# Patient Record
Sex: Male | Born: 1955 | Race: White | Hispanic: No | Marital: Single | Smoking: Never smoker
Health system: Southern US, Community
[De-identification: ages and names within clinical notes are randomized; demographics above are authoritative.]

## PROBLEM LIST (undated history)

## (undated) DIAGNOSIS — F32A Depression, unspecified: Secondary | ICD-10-CM

## (undated) DIAGNOSIS — N183 Chronic kidney disease, stage 3 unspecified: Secondary | ICD-10-CM

## (undated) DIAGNOSIS — M109 Gout, unspecified: Secondary | ICD-10-CM

## (undated) DIAGNOSIS — I10 Essential (primary) hypertension: Secondary | ICD-10-CM

## (undated) DIAGNOSIS — Z972 Presence of dental prosthetic device (complete) (partial): Secondary | ICD-10-CM

## (undated) DIAGNOSIS — E119 Type 2 diabetes mellitus without complications: Secondary | ICD-10-CM

## (undated) DIAGNOSIS — G3184 Mild cognitive impairment, so stated: Secondary | ICD-10-CM

## (undated) HISTORY — PX: TOE AMPUTATION: SHX809

## (undated) HISTORY — PX: COLONOSCOPY: SHX174

---

## 2021-06-02 ENCOUNTER — Observation Stay
Admission: EM | Admit: 2021-06-02 | Discharge: 2021-06-04 | Disposition: A | Discharge status: Skilled Nursing Facility | Payer: Medicare PPO | Attending: Internal Medicine | Admitting: Internal Medicine

## 2021-06-02 ENCOUNTER — Emergency Department: Payer: Medicare PPO

## 2021-06-02 ENCOUNTER — Other Ambulatory Visit: Payer: Self-pay

## 2021-06-02 ENCOUNTER — Observation Stay: Payer: Medicare PPO

## 2021-06-02 DIAGNOSIS — I080 Rheumatic disorders of both mitral and aortic valves: Secondary | ICD-10-CM | POA: Diagnosis not present

## 2021-06-02 DIAGNOSIS — E669 Obesity, unspecified: Secondary | ICD-10-CM | POA: Insufficient documentation

## 2021-06-02 DIAGNOSIS — R55 Syncope and collapse: Principal | ICD-10-CM | POA: Diagnosis present

## 2021-06-02 DIAGNOSIS — E785 Hyperlipidemia, unspecified: Secondary | ICD-10-CM | POA: Diagnosis not present

## 2021-06-02 DIAGNOSIS — Z6830 Body mass index (BMI) 30.0-30.9, adult: Secondary | ICD-10-CM | POA: Diagnosis not present

## 2021-06-02 DIAGNOSIS — R4182 Altered mental status, unspecified: Secondary | ICD-10-CM | POA: Insufficient documentation

## 2021-06-02 DIAGNOSIS — R61 Generalized hyperhidrosis: Secondary | ICD-10-CM | POA: Diagnosis not present

## 2021-06-02 DIAGNOSIS — Z89412 Acquired absence of left great toe: Secondary | ICD-10-CM | POA: Insufficient documentation

## 2021-06-02 DIAGNOSIS — N1831 Chronic kidney disease, stage 3a: Secondary | ICD-10-CM | POA: Diagnosis not present

## 2021-06-02 DIAGNOSIS — Z20822 Contact with and (suspected) exposure to covid-19: Secondary | ICD-10-CM | POA: Diagnosis not present

## 2021-06-02 DIAGNOSIS — I129 Hypertensive chronic kidney disease with stage 1 through stage 4 chronic kidney disease, or unspecified chronic kidney disease: Secondary | ICD-10-CM | POA: Diagnosis not present

## 2021-06-02 DIAGNOSIS — Z79899 Other long term (current) drug therapy: Secondary | ICD-10-CM | POA: Diagnosis not present

## 2021-06-02 DIAGNOSIS — K5909 Other constipation: Secondary | ICD-10-CM | POA: Diagnosis not present

## 2021-06-02 DIAGNOSIS — R600 Localized edema: Secondary | ICD-10-CM | POA: Insufficient documentation

## 2021-06-02 DIAGNOSIS — E1122 Type 2 diabetes mellitus with diabetic chronic kidney disease: Secondary | ICD-10-CM | POA: Insufficient documentation

## 2021-06-02 DIAGNOSIS — Z794 Long term (current) use of insulin: Secondary | ICD-10-CM | POA: Insufficient documentation

## 2021-06-02 HISTORY — DX: Type 2 diabetes mellitus without complications: E11.9

## 2021-06-02 LAB — CBC
HCT: 37.8 % — ABNORMAL LOW (ref 39.0–52.0)
Hemoglobin: 12.3 g/dL — ABNORMAL LOW (ref 13.0–17.0)
MCH: 29.1 pg (ref 26.0–34.0)
MCHC: 32.5 g/dL (ref 30.0–36.0)
MCV: 89.6 fL (ref 80.0–100.0)
Platelets: 250 10*3/uL (ref 150–400)
RBC: 4.22 MIL/uL (ref 4.22–5.81)
RDW: 12.7 % (ref 11.5–15.5)
WBC: 6.8 10*3/uL (ref 4.0–10.5)
nRBC: 0 % (ref 0.0–0.2)

## 2021-06-02 LAB — URINE DRUG SCREEN, QUALITATIVE (ARMC ONLY)
Amphetamines, Ur Screen: NOT DETECTED
Barbiturates, Ur Screen: NOT DETECTED
Benzodiazepine, Ur Scrn: NOT DETECTED
Cannabinoid 50 Ng, Ur ~~LOC~~: NOT DETECTED
Cocaine Metabolite,Ur ~~LOC~~: NOT DETECTED
MDMA (Ecstasy)Ur Screen: NOT DETECTED
Methadone Scn, Ur: NOT DETECTED
Opiate, Ur Screen: NOT DETECTED
Phencyclidine (PCP) Ur S: NOT DETECTED
Tricyclic, Ur Screen: NOT DETECTED

## 2021-06-02 LAB — URINALYSIS, ROUTINE W REFLEX MICROSCOPIC
Bilirubin Urine: NEGATIVE
Glucose, UA: 150 mg/dL — AB
Hgb urine dipstick: NEGATIVE
Ketones, ur: NEGATIVE mg/dL
Leukocytes,Ua: NEGATIVE
Nitrite: NEGATIVE
Protein, ur: NEGATIVE mg/dL
Specific Gravity, Urine: 1.005 (ref 1.005–1.030)
pH: 6 (ref 5.0–8.0)

## 2021-06-02 LAB — HIV ANTIBODY (ROUTINE TESTING W REFLEX): HIV Screen 4th Generation wRfx: NONREACTIVE

## 2021-06-02 LAB — BASIC METABOLIC PANEL
Anion gap: 7 (ref 5–15)
BUN: 19 mg/dL (ref 8–23)
CO2: 24 mmol/L (ref 22–32)
Calcium: 8.8 mg/dL — ABNORMAL LOW (ref 8.9–10.3)
Chloride: 104 mmol/L (ref 98–111)
Creatinine, Ser: 1.72 mg/dL — ABNORMAL HIGH (ref 0.61–1.24)
GFR, Estimated: 43 mL/min — ABNORMAL LOW (ref >60)
Glucose, Bld: 160 mg/dL — ABNORMAL HIGH (ref 70–99)
Potassium: 3.8 mmol/L (ref 3.5–5.1)
Sodium: 135 mmol/L (ref 135–145)

## 2021-06-02 LAB — RESP PANEL BY RT-PCR (FLU A&B, COVID) ARPGX2
Influenza A by PCR: NEGATIVE
Influenza B by PCR: NEGATIVE
SARS Coronavirus 2 by RT PCR: NEGATIVE

## 2021-06-02 LAB — GLUCOSE, CAPILLARY
Glucose-Capillary: 323 mg/dL — ABNORMAL HIGH (ref 70–99)
Glucose-Capillary: 312 mg/dL — ABNORMAL HIGH (ref 70–99)

## 2021-06-02 LAB — TROPONIN I (HIGH SENSITIVITY)
Troponin I (High Sensitivity): 9 ng/L (ref <18)
Troponin I (High Sensitivity): 11 ng/L (ref <18)

## 2021-06-02 MED ORDER — HEPARIN SODIUM (PORCINE) 5000 UNIT/ML IJ SOLN
5000.0000 [IU] | Freq: Three times a day (TID) | INTRAMUSCULAR | Status: DC
  Administered 2021-06-02 – 2021-06-04 (×6): 5000 [IU] via SUBCUTANEOUS
  Filled 2021-06-02 (×6): qty 1

## 2021-06-02 MED ORDER — INSULIN DETEMIR 100 UNIT/ML ~~LOC~~ SOLN
15.0000 [IU] | Freq: Two times a day (BID) | SUBCUTANEOUS | Status: DC
  Administered 2021-06-02 – 2021-06-04 (×4): 15 [IU] via SUBCUTANEOUS
  Filled 2021-06-02 (×6): qty 0.15

## 2021-06-02 MED ORDER — ALLOPURINOL 100 MG PO TABS
100.0000 mg | ORAL_TABLET | Freq: Every day | ORAL | Status: DC
  Administered 2021-06-02 – 2021-06-04 (×3): 100 mg via ORAL
  Filled 2021-06-02 (×4): qty 1

## 2021-06-02 MED ORDER — SODIUM CHLORIDE 0.9 % IV SOLN: INTRAVENOUS | Status: DC

## 2021-06-02 MED ORDER — LOSARTAN POTASSIUM 25 MG PO TABS
25.0000 mg | ORAL_TABLET | Freq: Every day | ORAL | Status: DC
  Administered 2021-06-03 – 2021-06-04 (×2): 25 mg via ORAL
  Filled 2021-06-02 (×2): qty 1

## 2021-06-02 MED ORDER — INSULIN ASPART 100 UNIT/ML FLEXPEN: 3.0000 [IU] | PEN_INJECTOR | Freq: Three times a day (TID) | SUBCUTANEOUS | Status: DC

## 2021-06-02 MED ORDER — POLYETHYLENE GLYCOL 3350 17 G PO PACK
17.0000 g | PACK | Freq: Two times a day (BID) | ORAL | Status: DC
  Administered 2021-06-03: 17 g via ORAL
  Filled 2021-06-02 (×4): qty 1

## 2021-06-02 MED ORDER — SODIUM CHLORIDE 0.9 % IV BOLUS
500.0000 mL | Freq: Once | INTRAVENOUS | Status: AC
  Administered 2021-06-02: 500 mL via INTRAVENOUS

## 2021-06-02 MED ORDER — DOCUSATE SODIUM 100 MG PO CAPS
100.0000 mg | ORAL_CAPSULE | Freq: Two times a day (BID) | ORAL | Status: DC
  Administered 2021-06-02 – 2021-06-03 (×3): 100 mg via ORAL
  Filled 2021-06-02 (×4): qty 1

## 2021-06-02 MED ORDER — VITAMIN D 25 MCG (1000 UNIT) PO TABS
2000.0000 [IU] | ORAL_TABLET | Freq: Every day | ORAL | Status: DC
  Administered 2021-06-02 – 2021-06-04 (×3): 2000 [IU] via ORAL
  Filled 2021-06-02 (×3): qty 2

## 2021-06-02 MED ORDER — INSULIN ASPART 100 UNIT/ML IJ SOLN
3.0000 [IU] | Freq: Three times a day (TID) | INTRAMUSCULAR | Status: DC
  Administered 2021-06-02 – 2021-06-04 (×6): 3 [IU] via SUBCUTANEOUS
  Filled 2021-06-02 (×6): qty 1

## 2021-06-02 MED ORDER — SODIUM CHLORIDE 0.9% FLUSH
3.0000 mL | Freq: Two times a day (BID) | INTRAVENOUS | Status: DC
  Administered 2021-06-02: 3 mL via INTRAVENOUS

## 2021-06-02 MED ORDER — INSULIN NPH (HUMAN) (ISOPHANE) 100 UNIT/ML ~~LOC~~ SUSP
15.0000 [IU] | Freq: Two times a day (BID) | SUBCUTANEOUS | Status: DC
Start: 2021-06-02 — End: 2021-06-02

## 2021-06-02 MED ORDER — ONDANSETRON HCL 4 MG/2ML IJ SOLN: 4.0000 mg | Freq: Four times a day (QID) | INTRAMUSCULAR | Status: DC | PRN

## 2021-06-02 MED ORDER — AMLODIPINE BESYLATE 5 MG PO TABS
5.0000 mg | ORAL_TABLET | Freq: Every day | ORAL | Status: DC
Start: 2021-06-03 — End: 2021-06-04
  Administered 2021-06-03 – 2021-06-04 (×2): 5 mg via ORAL
  Filled 2021-06-02 (×2): qty 1

## 2021-06-02 MED ORDER — ONDANSETRON HCL 4 MG PO TABS: 4.0000 mg | ORAL_TABLET | Freq: Four times a day (QID) | ORAL | Status: DC | PRN

## 2021-06-02 NOTE — ED Notes (Signed)
Secure msg sent to Amandeep Kittaney, RN for ED to IP SBAR. °

## 2021-06-02 NOTE — Progress Notes (Addendum)
IVT consulted for PIV placement.  During ID of patient, name is spelled incorrectly on  armband.  RNs Ramell and Amandeep made aware via secure chat. ?

## 2021-06-02 NOTE — ED Notes (Signed)
Diannia Ruder, RN reported to this RN pt was orthostatic from sitting to standing.  ?

## 2021-06-02 NOTE — ED Notes (Signed)
Patient provided with urinal.   Unable to provide urine sample at this time

## 2021-06-02 NOTE — ED Notes (Signed)
This RN answered call bell, pt states he needed to urinate and he insisted that he has not given any urine to anyone today for this visit. This RN called lab and explained to them that this RN was concerned it was not his urine that was sent, this RN requests that they stop running this urine and that I will send a new one, MD aware.  ?

## 2021-06-02 NOTE — ED Triage Notes (Signed)
Patient to ER via [facility] from [place] [place]. Reports he was eating breakfast when he felt lightheaded. Family reports they witnessed the patient become unconscious for approximately one minute where his head fell forwards then back. No seizure activity witnessed. ? ?Initial blood pressure was 74/56. Patient pale, cool, and diaphoretic on EMS arrival. After 500cc bolus of normal saline repeat BP 139/76. Patient also had one episode of vomiting. 4mg  IV zofran administered by EMS.  ? ?EMS VS- HR 74, Ekg NSR. Temp 97.9. CBG 154. ? ?Patient denies pain. No SHOB. No cardiac history.  ?

## 2021-06-02 NOTE — ED Notes (Signed)
Report to Michelle, RN

## 2021-06-02 NOTE — ED Notes (Signed)
Dinner tray brought to pt by dietary services. ?

## 2021-06-02 NOTE — H&P (Signed)
H&P:    Robert Levine   [id number] DOB: October 27, 1955 DOA: 06/02/2021  PCP: Pcp, No  Chief Complaint: Syncope   History of Present Illness:    HPI: Robert Levine is a 66 y.o. male with a past medical history of insulin-dependent diabetes mellitus type 2, chronic kidney disease stage IIIA/B, essential hypertension, history of left great toe amputation, obesity  This patient presents to the ER via EMS from [place] [place] where he was having breakfast when he felt sudden onset of lightheadedness. Family reports they witnessed him lose consciousness for approximately 2 minutes.  His head rolled back.  No seizure activity witnessed.  He was also pale and diaphoretic.  No known history of seizures.  No known history of TIA or CVA. When EMS arrived his initial blood pressure was 74/46.  He received 500 cc of normal saline en-route and repeat blood pressure was 139/76.  He also had 1 episode of vomiting.   Seen and examined in the ER.  No focal neurological deficits.  Appears to be back at his baseline. He resides at [facility] ([facility]).  States he has had hospital admissions before for dehydration.  No urinary continence.  No fecal incontinence.  Did not bite his tongue.  No chest pain. No shortness of breath. No abdominal pain. No diarrhea. No nausea.  ED Course: Given IV fluid hydration with normal saline 500 cc bolus.  EKG unremarkable.  Labs unremarkable with creatinine appearing to be at baseline.  Chest x-ray and CT head negative.  UDS and urinalysis pending.  Initial troponin unremarkable.   ROS:   13 point review of systems is negative except for what is mentioned above in the HPI.   Past Medical History:   Past Medical History:  Diagnosis Date   Diabetes mellitus without complication ([place])     Past Surgical History:   Past Surgical History:  Procedure Laterality Date   TOE AMPUTATION Right     Social History:   Social History   Socioeconomic History   Marital  status: Single    Spouse name: Not on file   Number of children: Not on file   Years of education: Not on file   Highest education level: Not on file  Occupational History   Not on file  Tobacco Use   Smoking status: Not on file   Smokeless tobacco: Not on file  Substance and Sexual Activity   Alcohol use: Not on file   Drug use: Not on file   Sexual activity: Not on file  Other Topics Concern   Not on file  Social History Narrative   Not on file   Social Determinants of Health   Financial Resource Strain: Not on file  Food Insecurity: Not on file  Transportation Needs: Not on file  Physical Activity: Not on file  Stress: Not on file  Social Connections: Not on file  Intimate Partner Violence: Not on file    Allergies:   Not on File  Family History:   No family history on file.   Current Medications:   Prior to Admission medications   Not on File     Physical Exam:   Vitals:   06/02/21 0944 06/02/21 1000 06/02/21 1030 06/02/21 1100  BP:  132/73 (!) 150/74 (!) 144/71  Pulse:  79 83 86  Resp:  18 17 16   Temp: 98.2 F (36.8 C)     TempSrc: Oral     SpO2:  95% 97% 98%  Weight:  Height:         General:  Appears calm and comfortable and is in NAD Cardiovascular:  RRR, no m/r/g.  Respiratory:   CTA bilaterally with no wheezes/rales/rhonchi.  Normal respiratory effort. Abdomen:  soft, NT, ND, NABS Skin:  no rash or induration seen on limited exam Musculoskeletal:  grossly normal tone BUE/BLE, good ROM, no bony abnormality Lower extremity: Trace edema bilateral lower extremities.  Limited foot exam with no ulcerations.  2+ distal pulses.  Left great toe amputation Psychiatric:  grossly normal mood and affect, speech fluent and appropriate, AOx3 Neurologic:  CN 2-12 grossly intact, moves all extremities in coordinated fashion, sensation intact    Data Review:    Radiological Exams on Admission: Independently reviewed - see discussion in A/P  where applicable  CT HEAD WO CONTRAST (5MM)  Result Date: 06/02/2021 CLINICAL DATA:  66 year old male with altered mental status, loss of consciousness while eating breakfast. EXAM: CT HEAD WITHOUT CONTRAST TECHNIQUE: Contiguous axial images were obtained from the base of the skull through the vertex without intravenous contrast. RADIATION DOSE REDUCTION: This exam was performed according to the departmental dose-optimization program which includes automated exposure control, adjustment of the mA and/or kV according to patient size and/or use of iterative reconstruction technique. COMPARISON:  None. FINDINGS: Brain: Cerebral volume is within normal limits for age. No midline shift, ventriculomegaly, mass effect, evidence of mass lesion, intracranial hemorrhage or evidence of cortically based acute infarction. Possible tiny chronic lacunar infarct in the right cerebellum (coronal image 59). But otherwise gray-white matter differentiation throughout the brain appears normal for age. No cerebral cortical encephalomalacia identified. Vascular: Calcified atherosclerosis at the skull base. No suspicious intracranial vascular hyperdensity. Skull: No acute osseous abnormality identified. Hyperostosis frontalis, normal variant. Sinuses/Orbits: Visualized paranasal sinuses and mastoids are clear. Tympanic cavities are clear. Other: Visualized orbits and scalp soft tissues are within normal limits. IMPRESSION: No acute intracranial abnormality. Negative brain for age aside from possible tiny chronic lacunar infarct in the right cerebellum. Electronically Signed   By: Genevie Ann M.D.   On: 06/02/2021 10:58   DG Chest Portable 1 View  Result Date: 06/02/2021 CLINICAL DATA:  66 year old male with altered mental status, loss of consciousness while eating breakfast. EXAM: PORTABLE CHEST 1 VIEW COMPARISON:  None. FINDINGS: Portable AP upright view at 1033 hours. Low normal lung volumes. Normal cardiac size and mediastinal  contours. Visualized tracheal air column is within normal limits. Allowing for portable technique the lungs are clear. No pneumothorax or pleural effusion. No acute osseous abnormality identified. IMPRESSION: Negative portable chest. Electronically Signed   By: Genevie Ann M.D.   On: 06/02/2021 10:59    EKG: Independently reviewed.  Sinus rhythm, rate 85   Labs on Admission: I have personally reviewed the available labs and imaging studies at the time of the admission.  Pertinent labs on Admission: BUN 43, creatinine 1.72, blood glucose 160, hemoglobin 12   Assessment/Plan:   Syncope: Witnessed.  Likely secondary to dehydration and beta-blocker induced orthostatic hypotension.  Initial blood pressure 74/56 that responded to IV fluid resuscitation.  Hemodynamics are now stable.  Start maintenance IV fluids.  Obtain orthostatics every shift. Orthostatics positive in the ED. Obtain echocardiogram.  Obtain carotid duplex ultrasound.  Monitor on telemetry for any arrhythmias.  Hypertension: Continue home Norvasc and Cozaar. Discontinuing home Toprol-XL due to the above (he was on 100mg  in the morning and 150mg  in the evening). No known history of CHF or arrhythmias. Can titrate up on Norvasc  and Cozaar to reach BP goals.  IDDMT2: The patient is on NPH 30 units BID.  Obtain A1c.  We will start NPH 15 units BID. Continue home Novolog 3 units TID with meals. CBG monitoring AC and HS.  Chronic kidney disease stage IIIA/IIIB:  Initial creatinine 1.72.  Appears at baseline per Care Everywhere.  Baseline creatinine appears to be around 1.4-1.7.  Obesity: BMI 30.43 per EMR.  Counseled on lifestyle modifications.  Chronic constipation: Continue home Miralax and Colace   Other information:    Level of Care: Med telemetry DVT prophylaxis: Heparin subcu Code Status: Full code Consults: None Admission status: Observation   Leslee Home DO [facility]   How to contact the [facility] Attending or  Consulting provider [place] or covering provider during after hours [place], for this patient?  Check the care team in [facility] and look for a) attending/consulting [facility] provider listed and b) the [facility] team listed Log into [email or web address] and use [place]'s universal password to access. If you do not have the password, please contact the hospital operator. Locate the [facility] provider you are looking for under [facility] and page to a number that you can be directly reached. If you still have difficulty reaching the provider, please page the [facility] (Director on Call) for the Hospitalists listed on amion for assistance.   06/02/2021, 12:27 PM

## 2021-06-02 NOTE — ED Notes (Signed)
Patient transported to CT 

## 2021-06-02 NOTE — ED Provider Notes (Addendum)
? ?[facility] ?Provider Note ? ? ? Event Date/Time  ? First MD Initiated Contact with Patient 06/02/21 1016   ?  (approximate) ? ? ?History  ? ?Loss of Consciousness ? ? ?HPI ? ?Robert Levine is a 66 y.o. male   history of diabetes type 2 who presents to the ER for evaluation of LOC that occurred while he was at [employer] this morning.  States he is otherwise in his usual state of health.  States that he had finished his meal and friend from church who is currently in the ER witnessed become pale diaphoretic and his head rolled back.  There is no shaking episode..  Did not bite his tongue.  Did have spontaneous respirations but was unresponsive for a few moments.  EMS found the patient was hypotensive.  Patient states he has been admitted for dehydration in the past.  He denies any chest pain or pressure at this time no history of liver issues no history of lung issues.  No history of stroke or TIA.  States he does not take any blood thinners. ? ?  ? ? ?Physical Exam  ? ?Triage Vital Signs: ?ED Triage Vitals  ?Enc Vitals Group  ?   BP 06/02/21 0941 139/76  ?   Pulse Rate 06/02/21 0941 78  ?   Resp 06/02/21 0941 16  ?   Temp 06/02/21 0944 98.2 ?F (36.8 ?C)  ?   Temp Source 06/02/21 0944 Oral  ?   SpO2 06/02/21 0941 95 %  ?   Weight 06/02/21 0942 250 lb (113.4 kg)  ?   Height 06/02/21 0942 6\' 4"  (1.93 m)  ?   Head Circumference --   ?   Peak Flow --   ?   Pain Score 06/02/21 0942 0  ?   Pain Loc --   ?   Pain Edu? --   ?   Excl. in [place]? --   ? ? ?Most recent vital signs: ?Vitals:  ? 06/02/21 1030 06/02/21 1100  ?BP: (!) 150/74 (!) 144/71  ?Pulse: 83 86  ?Resp: 17 16  ?Temp:    ?SpO2: 97% 98%  ? ? ? ?Constitutional: Alert  ?Eyes: Conjunctivae are normal.  ?Head: Atraumatic. ?Nose: No congestion/rhinnorhea. ?Mouth/Throat: Mucous membranes are moist.   ?Neck: Painless ROM.  ?Cardiovascular:   Good peripheral circulation. ?Respiratory: Normal respiratory effort.  No retractions.   ?Gastrointestinal: Soft and nontender.  ?Musculoskeletal:  no deformity ?Neurologic:  MAE spontaneously. No gross focal neurologic deficits are appreciated.  ?Skin:  Skin is warm, dry and intact. No rash noted. ?Psychiatric: Mood and affect are normal. Speech and behavior are normal. ? ? ? ?ED Results / Procedures / Treatments  ? ?Labs ?(all labs ordered are listed, but only abnormal results are displayed) ?Labs Reviewed  ?BASIC METABOLIC PANEL - Abnormal; Notable for the following components:  ?    Result Value  ? Glucose, Bld 160 (*)   ? Creatinine, Ser 1.72 (*)   ? Calcium 8.8 (*)   ? GFR, Estimated 43 (*)   ? All other components within normal limits  ?CBC - Abnormal; Notable for the following components:  ? Hemoglobin 12.3 (*)   ? HCT 37.8 (*)   ? All other components within normal limits  ?URINALYSIS, ROUTINE W REFLEX MICROSCOPIC - Abnormal; Notable for the following components:  ? Color, Urine STRAW (*)   ? APPearance CLEAR (*)   ? Specific Gravity, Urine 1.004 (*)   ?  Hgb urine dipstick MODERATE (*)   ? Ketones, ur 5 (*)   ? Leukocytes,Ua SMALL (*)   ? All other components within normal limits  ?URINE DRUG SCREEN, QUALITATIVE ([facility] ONLY)  ?CBG MONITORING, ED  ?TROPONIN I (HIGH SENSITIVITY)  ? ? ? ?EKG ? ?ED ECG REPORT ?I, Merlyn Lot, the attending physician, personally viewed and interpreted this ECG. ? ? Date: 06/02/2021 ? EKG Time: 9:38 ? Rate: 75 ? Rhythm: sinus ? Axis: normal ? Intervals:normal ? ST&T Change: no stemi, no depression ? ? ? ?RADIOLOGY ?Please see ED Course for my review and interpretation. ? ?I personally reviewed all radiographic images ordered to evaluate for the above acute complaints and reviewed radiology reports and findings.  These findings were personally discussed with the patient.  Please see medical record for radiology report. ? ? ? ?PROCEDURES: ? ?Critical Care performed: No ? ?.1-3 Lead EKG Interpretation ?Performed by: Merlyn Lot, MD ?Authorized by: Merlyn Lot, MD  ? ?  Interpretation: normal   ?  ECG rate:  85 ?  ECG rate assessment: normal   ?  Rhythm: sinus rhythm   ? ? ?MEDICATIONS ORDERED IN ED: ?Medications  ?sodium chloride 0.9 % bolus 500 mL (500 mLs Intravenous New Bag/Given 06/02/21 1041)  ? ? ? ?IMPRESSION / MDM / ASSESSMENT AND PLAN / ED COURSE  ?I reviewed the triage vital signs and the nursing notes. ?             ?               ? ?Differential diagnosis includes, but is not limited to, dysrhythmia, anemia, acs, pna, sepsis, orthostasis, hypoglycemia, ? ?Witnessed syncopal episode as described above.  Patient arrives nontoxic-appearing protecting his airway.  Reports that he has been admitted for dehydration in the past.  May be mildly dehydrated.  Denying any chest pain.  CT imaging ordered to evaluate for acute intracranial abnormality though history seems less consistent with seizure.  The patient will be placed on continuous pulse oximetry and telemetry for monitoring.  Laboratory evaluation will be sent to evaluate for the above complaints.   ? ? ?Clinical Course as of 06/02/21 1201  ?Fri Jun 02, 2021  ?1057 My review of chest x-ray no sign of infiltrate or pneumothorax. [PR]  ?1057 My review of CT head no sign of acute bleed.  Will await formal radiology report. [PR]  ?1200 Troponin is negative.  Based on age risk factors and syncopal episode I have consulted hospitalist for admission who agrees to admit to their service. we will continue with IV hydration. [PR]  ?  ?Clinical Course User Index ?[PR] Merlyn Lot, MD  ? ? ? ?FINAL CLINICAL IMPRESSION(S) / ED DIAGNOSES  ? ?Final diagnoses:  ?Syncope, unspecified syncope type  ? ? ? ?Rx / DC Orders  ? ?ED Discharge Orders   ? ? None  ? ?  ? ? ? ?Note:  This document was prepared using Dragon voice recognition software and may include unintentional dictation errors. ? ?  ? ?  ?Merlyn Lot, MD ?06/02/21 1202 ? ?

## 2021-06-03 ENCOUNTER — Observation Stay (HOSPITAL_BASED_OUTPATIENT_CLINIC_OR_DEPARTMENT_OTHER)
Admit: 2021-06-03 | Discharge: 2021-06-03 | Disposition: A | Discharge status: Home / Self Care | Payer: Medicare PPO | Attending: Family Medicine | Admitting: Family Medicine

## 2021-06-03 DIAGNOSIS — R55 Syncope and collapse: Secondary | ICD-10-CM | POA: Diagnosis not present

## 2021-06-03 LAB — CBC
HCT: 39 % (ref 39.0–52.0)
Hemoglobin: 12.6 g/dL — ABNORMAL LOW (ref 13.0–17.0)
MCH: 29.1 pg (ref 26.0–34.0)
MCHC: 32.3 g/dL (ref 30.0–36.0)
MCV: 90.1 fL (ref 80.0–100.0)
Platelets: 251 10*3/uL (ref 150–400)
RBC: 4.33 MIL/uL (ref 4.22–5.81)
RDW: 12.7 % (ref 11.5–15.5)
WBC: 5.7 10*3/uL (ref 4.0–10.5)
nRBC: 0 % (ref 0.0–0.2)

## 2021-06-03 LAB — BASIC METABOLIC PANEL
Anion gap: 10 (ref 5–15)
BUN: 20 mg/dL (ref 8–23)
CO2: 23 mmol/L (ref 22–32)
Calcium: 9.2 mg/dL (ref 8.9–10.3)
Chloride: 104 mmol/L (ref 98–111)
Creatinine, Ser: 1.49 mg/dL — ABNORMAL HIGH (ref 0.61–1.24)
GFR, Estimated: 51 mL/min — ABNORMAL LOW (ref >60)
Glucose, Bld: 253 mg/dL — ABNORMAL HIGH (ref 70–99)
Potassium: 3.9 mmol/L (ref 3.5–5.1)
Sodium: 137 mmol/L (ref 135–145)

## 2021-06-03 LAB — ECHOCARDIOGRAM COMPLETE
AR max vel: 1.76 cm2
AV Peak grad: 10.5 mmHg
Ao pk vel: 1.62 m/s
Area-P 1/2: 5.38 cm2
Height: 76 in
S' Lateral: 2.97 cm
Weight: 4000 oz

## 2021-06-03 LAB — TSH: TSH: 1.633 u[IU]/mL (ref 0.350–4.500)

## 2021-06-03 LAB — GLUCOSE, CAPILLARY
Glucose-Capillary: 243 mg/dL — ABNORMAL HIGH (ref 70–99)
Glucose-Capillary: 259 mg/dL — ABNORMAL HIGH (ref 70–99)
Glucose-Capillary: 247 mg/dL — ABNORMAL HIGH (ref 70–99)
Glucose-Capillary: 313 mg/dL — ABNORMAL HIGH (ref 70–99)

## 2021-06-03 NOTE — Discharge Summary (Signed)
Physician Discharge Summary   Patient: Robert Levine MRN: [id number] DOB: 1955/10/14  Admit date:     06/02/2021  Discharge date: 06/03/21  Discharge Physician: Antonieta Pert   PCP: Pcp, No   Recommendations at discharge:   Follow-up with PCP in 1 week Monitor blood pressure at home  Hospital Course: [phone] with PMHx of DM, obesity with BMI 30.4, CKD stage IIIa, hypertension history of left great toe amputation, being in ALF, brought from [employer] [facility] by EMS while he had sudden onset of lightheadedness and family witnessed he lost consciousness for approximately 2 minutes-described as  his head rolled back.  No seizure activity witnessed.  He was also pale and diaphoretic.  No known history of seizures.  No known history of TIA or CVA. When EMS arrived his initial blood pressure was 74/46.  He received 500 cc of normal saline en-route and repeat blood pressure was 139/76.  He also had 1 episode of vomiting.  In the ED,nonfocal on exam appeared to be at baseline, EKG unremarkable labs fairly stable chest x-ray and CT head were negative , serial troponins negative x2, COVID-19 and influenza negative UDS negative UA no evidence of UTI.  Patient was admitted for observation overnight, on telemetry carotid ultrasound unremarkable. Awaiting echocardiogram read and it came back no acute significant findings. He is anxious to leave today, He feels improved. Carotid ultrasound unremarkable.  Echo pending orthostatic vitals stable and asymptomatic He will be discharged home once echo is resulted and unremarkable  Discharge diagnosis Episode of syncope Orthostatic syncope: Patient was hypotensive and orthostatic leading to syncope most likely the etiology.  Blood pressure improved IV fluids.  Antihypertensive meds adjusted.  Ruling out other etiology with echocardiogram and a carotid duplex in telemetry. Hypertension-on home Norvasc Cozaar and Toprol-Toprol discontinued. Insulin-dependent  diabetes mellitus type 2 on NPH 30 units twice daily-resume home meds CKD stage IIIa baseline creatinine 1.4-1.7, repeat BMP improved to 1. 4 from 1.7 Class I obesity with BMI 30.4-benefit with weight loss Chronic constipation-takes MiraLAX and Colace. Medically stable for d/c once echo resulted.  No notes have been filed under this hospital service. Service: Hospitalist     Consultants: none Procedures performed: echo, carotid duplex  Disposition: Home Diet recommendation:  Diet Orders (From admission, onward)     Start     Ordered   06/02/21 1219  Diet Carb Modified Fluid consistency: Thin; Room service appropriate? Yes  Diet effective now       Question Answer Comment  Diet-HS Snack? Nothing   Calorie Level Medium 1600-2000   Fluid consistency: Thin   Room service appropriate? Yes      06/02/21 1220             DISCHARGE MEDICATION: Allergies as of 06/03/2021       Reactions   Empagliflozin    Euglycemic DKA 2/2 SGLT2 therapy         Medication List     STOP taking these medications    metoprolol succinate 50 MG 24 hr tablet Commonly known as: TOPROL-XL       TAKE these medications    allopurinol 100 MG tablet Commonly known as: ZYLOPRIM Take 100 mg by mouth daily.   amLODipine 5 MG tablet Commonly known as: NORVASC Take 5 mg by mouth daily.   docusate sodium 100 MG capsule Commonly known as: COLACE Take 100 mg by mouth 2 (two) times daily.   insulin NPH Human 100 UNIT/ML injection Commonly known as: NOVOLIN N  Inject 30 Units into the skin 2 (two) times daily.   losartan 25 MG tablet Commonly known as: COZAAR Take 25 mg by mouth daily.   NovoLOG FlexPen 100 UNIT/ML FlexPen Generic drug: insulin aspart Inject 3 Units into the skin 3 (three) times daily.   polyethylene glycol 17 g packet Commonly known as: MIRALAX / GLYCOLAX Take 17 g by mouth 2 (two) times daily.   Vitamin D3 50 MCG (2000 UT) Tabs Take 2,000 Units by mouth daily.         Follow-up Information     Follow-up with primary care doctor Follow up in 1 week(s).                  Discharge Exam: Filed Weights   06/02/21 0942  Weight: 113.4 kg    Condition at discharge: good  The results of significant diagnostics from this hospitalization (including imaging, microbiology, ancillary and laboratory) are listed below for reference.   Imaging Studies: CT HEAD WO CONTRAST (5MM)  Result Date: 06/02/2021 CLINICAL DATA:  66 year old male with altered mental status, loss of consciousness while eating breakfast. EXAM: CT HEAD WITHOUT CONTRAST TECHNIQUE: Contiguous axial images were obtained from the base of the skull through the vertex without intravenous contrast. RADIATION DOSE REDUCTION: This exam was performed according to the departmental dose-optimization program which includes automated exposure control, adjustment of the mA and/or kV according to patient size and/or use of iterative reconstruction technique. COMPARISON:  None. FINDINGS: Brain: Cerebral volume is within normal limits for age. No midline shift, ventriculomegaly, mass effect, evidence of mass lesion, intracranial hemorrhage or evidence of cortically based acute infarction. Possible tiny chronic lacunar infarct in the right cerebellum (coronal image 59). But otherwise gray-white matter differentiation throughout the brain appears normal for age. No cerebral cortical encephalomalacia identified. Vascular: Calcified atherosclerosis at the skull base. No suspicious intracranial vascular hyperdensity. Skull: No acute osseous abnormality identified. Hyperostosis frontalis, normal variant. Sinuses/Orbits: Visualized paranasal sinuses and mastoids are clear. Tympanic cavities are clear. Other: Visualized orbits and scalp soft tissues are within normal limits. IMPRESSION: No acute intracranial abnormality. Negative brain for age aside from possible tiny chronic lacunar infarct in the right cerebellum.  Electronically Signed   By: Genevie Ann M.D.   On: 06/02/2021 10:58   US Carotid Bilateral  Result Date: 06/02/2021 CLINICAL DATA:  Syncope Hypertension Hyperlipidemia Diabetes EXAM: BILATERAL CAROTID DUPLEX ULTRASOUND TECHNIQUE: Pearline Cables scale imaging, color Doppler and duplex ultrasound were performed of bilateral carotid and vertebral arteries in the neck. COMPARISON:  None. FINDINGS: Criteria: Quantification of carotid stenosis is based on velocity parameters that correlate the residual internal carotid diameter with NASCET-based stenosis levels, using the diameter of the distal internal carotid lumen as the denominator for stenosis measurement. The following velocity measurements were obtained: RIGHT ICA: 102/31 cm/sec CCA: 0000000 cm/sec SYSTOLIC ICA/CCA RATIO:  1.8 ECA: 182 cm/sec LEFT ICA: 91/29 cm/sec CCA: 123XX123 cm/sec SYSTOLIC ICA/CCA RATIO:  1.2 ECA: 159 cm/sec RIGHT CAROTID ARTERY: Mild calcified plaque of the carotid bifurcation. RIGHT VERTEBRAL ARTERY:  Antegrade flow. LEFT CAROTID ARTERY: Mild heterogeneous plaque of the left carotid bifurcation LEFT VERTEBRAL ARTERY:  Antegrade flow. IMPRESSION: No significant stenosis of internal carotid arteries. Electronically Signed   By: Miachel Roux M.D.   On: 06/02/2021 14:21   DG Chest Portable 1 View  Result Date: 06/02/2021 CLINICAL DATA:  66 year old male with altered mental status, loss of consciousness while eating breakfast. EXAM: PORTABLE CHEST 1 VIEW COMPARISON:  None. FINDINGS: Portable AP upright  view at 1033 hours. Low normal lung volumes. Normal cardiac size and mediastinal contours. Visualized tracheal air column is within normal limits. Allowing for portable technique the lungs are clear. No pneumothorax or pleural effusion. No acute osseous abnormality identified. IMPRESSION: Negative portable chest. Electronically Signed   By: Genevie Ann M.D.   On: 06/02/2021 10:59   ECHOCARDIOGRAM COMPLETE  Result Date: 06/03/2021    ECHOCARDIOGRAM REPORT    Patient Name:   Robert Levine Date of Exam: 06/03/2021 Medical Rec #:  [id number]      Height:       76.0 in Accession #:    [id number]     Weight:       250.0 lb Date of Birth:  1955/11/26     BSA:          2.436 m Patient Age:    1 years       BP:           144/71 mmHg Patient Gender: M              HR:           79 bpm. Exam Location:  [facility] Procedure: 2D Echo, Cardiac Doppler and Color Doppler Indications:     Syncope R55  History:         Patient has no prior history of Echocardiogram examinations.                  Risk Factors:Diabetes.  Sonographer:     Alyse Low Roar Referring Phys:  [place] Diagnosing Phys: Ida Rogue MD IMPRESSIONS  1. Left ventricular ejection fraction, by estimation, is 60 to 65%. The left ventricle has normal function. The left ventricle has no regional wall motion abnormalities. Left ventricular diastolic parameters are consistent with Grade I diastolic dysfunction (impaired relaxation).  2. Right ventricular systolic function is normal. The right ventricular size is normal. Tricuspid regurgitation signal is inadequate for assessing PA pressure.  3. The mitral valve is normal in structure. Mild mitral valve regurgitation. No evidence of mitral stenosis.  4. The aortic valve is normal in structure. Aortic valve regurgitation is mild. Aortic valve sclerosis is present, with no evidence of aortic valve stenosis.  5. The inferior vena cava is normal in size with greater than 50% respiratory variability, suggesting right atrial pressure of 3 mmHg. FINDINGS  Left Ventricle: Left ventricular ejection fraction, by estimation, is 60 to 65%. The left ventricle has normal function. The left ventricle has no regional wall motion abnormalities. The left ventricular internal cavity size was normal in size. There is  no left ventricular hypertrophy. Left ventricular diastolic parameters are consistent with Grade I diastolic dysfunction (impaired relaxation). Right Ventricle: The right  ventricular size is normal. No increase in right ventricular wall thickness. Right ventricular systolic function is normal. Tricuspid regurgitation signal is inadequate for assessing PA pressure. Left Atrium: Left atrial size was normal in size. Right Atrium: Right atrial size was normal in size. Pericardium: There is no evidence of pericardial effusion. Mitral Valve: The mitral valve is normal in structure. Mild mitral annular calcification. Mild mitral valve regurgitation. No evidence of mitral valve stenosis. Tricuspid Valve: The tricuspid valve is normal in structure. Tricuspid valve regurgitation is mild . No evidence of tricuspid stenosis. Aortic Valve: The aortic valve is normal in structure. Aortic valve regurgitation is mild. Aortic valve sclerosis is present, with no evidence of aortic valve stenosis. Aortic valve peak gradient measures 10.5 mmHg. Pulmonic Valve: The  pulmonic valve was normal in structure. Pulmonic valve regurgitation is not visualized. No evidence of pulmonic stenosis. Aorta: The aortic root is normal in size and structure. Venous: The inferior vena cava is normal in size with greater than 50% respiratory variability, suggesting right atrial pressure of 3 mmHg. IAS/Shunts: No atrial level shunt detected by color flow Doppler.  LEFT VENTRICLE PLAX 2D LVIDd:         4.53 cm   Diastology LVIDs:         2.97 cm   LV e' medial:    5.55 cm/s LV PW:         1.14 cm   LV E/e' medial:  18.7 LV IVS:        1.37 cm   LV e' lateral:   10.30 cm/s LVOT diam:     1.80 cm   LV E/e' lateral: 10.1 LVOT Area:     2.54 cm  RIGHT VENTRICLE RV Mid diam:    2.64 cm RV S prime:     13.40 cm/s TAPSE (M-mode): 3.1 cm LEFT ATRIUM             Index        RIGHT ATRIUM           Index LA diam:        3.80 cm 1.56 cm/m   RA Area:     10.10 cm LA Vol (A2C):   46.2 ml 18.97 ml/m  RA Volume:   19.30 ml  7.92 ml/m LA Vol (A4C):   43.0 ml 17.65 ml/m LA Biplane Vol: 45.8 ml 18.80 ml/m  AORTIC VALVE                  PULMONIC VALVE AV Area (Vmax): 1.76 cm     PV Vmax:          1.02 m/s AV Vmax:        162.00 cm/s  PV Peak grad:     4.2 mmHg AV Peak Grad:   10.5 mmHg    PR End Diast Vel: 2.57 msec LVOT Vmax:      112.00 cm/s  RVOT Peak grad:   3 mmHg  AORTA Ao Root diam: 3.00 cm Ao Asc diam:  3.20 cm MITRAL VALVE MV Area (PHT): 5.38 cm     SHUNTS MV Decel Time: 141 msec     Systemic Diam: 1.80 cm MV E velocity: 104.00 cm/s MV A velocity: 135.00 cm/s MV E/A ratio:  0.77 MV A Prime:    13.1 cm/s Ida Rogue MD Electronically signed by Ida Rogue MD Signature Date/Time: 06/03/2021/4:31:26 PM    Final     Microbiology: Results for orders placed or performed during the hospital encounter of 06/02/21  Resp Panel by RT-PCR (Flu A&B, Covid) Nasopharyngeal Swab     Status: None   Collection Time: 06/02/21  1:16 PM   Specimen: Nasopharyngeal Swab; Nasopharyngeal(NP) swabs in vial transport medium  Result Value Ref Range Status   SARS Coronavirus 2 by RT PCR NEGATIVE NEGATIVE Final    Comment: (NOTE) SARS-CoV-2 target nucleic acids are NOT DETECTED.  The SARS-CoV-2 RNA is generally detectable in upper respiratory specimens during the acute phase of infection. The lowest concentration of SARS-CoV-2 viral copies this assay can detect is 138 copies/mL. A negative result does not preclude SARS-Cov-2 infection and should not be used as the sole basis for treatment or other patient management decisions. A negative result may occur with  improper specimen collection/handling, submission  of specimen other than nasopharyngeal swab, presence of viral mutation(s) within the areas targeted by this assay, and inadequate number of viral copies(<138 copies/mL). A negative result must be combined with clinical observations, patient history, and epidemiological information. The expected result is Negative.  Fact Sheet for Patients:  EntrepreneurPulse.com.au  Fact Sheet for Healthcare Providers:   IncredibleEmployment.be  This test is no t yet approved or cleared by the [place] FDA and  has been authorized for detection and/or diagnosis of SARS-CoV-2 by FDA under an Emergency Use Authorization (EUA). This EUA will remain  in effect (meaning this test can be used) for the duration of the COVID-19 declaration under Section 564(b)(1) of the Act, 21 U.S.C.section 360bbb-3(b)(1), unless the authorization is terminated  or revoked sooner.       Influenza A by PCR NEGATIVE NEGATIVE Final   Influenza B by PCR NEGATIVE NEGATIVE Final    Comment: (NOTE) The Xpert Xpress SARS-CoV-2/FLU/RSV plus assay is intended as an aid in the diagnosis of influenza from Nasopharyngeal swab specimens and should not be used as a sole basis for treatment. Nasal washings and aspirates are unacceptable for Xpert Xpress SARS-CoV-2/FLU/RSV testing.  Fact Sheet for Patients: EntrepreneurPulse.com.au  Fact Sheet for Healthcare Providers: IncredibleEmployment.be  This test is not yet approved or cleared by the [place] FDA and has been authorized for detection and/or diagnosis of SARS-CoV-2 by FDA under an Emergency Use Authorization (EUA). This EUA will remain in effect (meaning this test can be used) for the duration of the COVID-19 declaration under Section 564(b)(1) of the Act, 21 U.S.C. section 360bbb-3(b)(1), unless the authorization is terminated or revoked.  Performed at [facility], [address] [place], [place] [address]     Labs: CBC: Recent Labs  Lab 06/02/21 0944 06/03/21 0625  WBC 6.8 5.7  HGB 12.3* 12.6*  HCT 37.8* 39.0  MCV 89.6 90.1  PLT 250 123XX123   Basic Metabolic Panel: Recent Labs  Lab 06/02/21 0944 06/03/21 0625  NA 135 137  K 3.8 3.9  CL 104 104  CO2 24 23  GLUCOSE 160* 253*  BUN 19 20  CREATININE 1.72* 1.49*  CALCIUM 8.8* 9.2   Liver Function Tests: No results for input(s): AST,  ALT, ALKPHOS, BILITOT, PROT, ALBUMIN in the last 168 hours. CBG: Recent Labs  Lab 06/02/21 1818 06/02/21 1951 06/03/21 0811 06/03/21 1206 06/03/21 1620  GLUCAP 323* 312* 243* 259* 247*   Discharge time spent:  25 minutes. Signed: Antonieta Pert, MD [facility] 06/03/2021

## 2021-06-03 NOTE — TOC Progression Note (Signed)
Transition of Care (TOC) - Progression Note  ? ? ?Patient Details  ?Name: Robert Levine ?MRN: [id number] ?Date of Birth: December 27, 1955 ? ?Transition of Care (TOC) CM/SW Contact  ?Izola Price, RN ?Phone Number: ?06/03/2021, 3:41 PM ? ?Clinical Narrative: 3/4: Admitted via ED 3/3. Lightheaded/lost consciousness ~ 2 minutes at [employer] per family. Htx of DM. Saline bolus for low BP on arrival. No notes yet today. [place]-119d. Simmie Davies RN CM  ? ?3/4: Attempted to reach patient to [facility] contact and [facility] document, patient in process of a cardiac echo.  Left VM on mobile. Gave my number to unit RN to call  when finished. Was admitted with AMS but reported resolved in ED/EMS after fluid bolus.  From [facility]F.  ?Contact: Sister: Edwin Dada [phone]. ?No PCP listed.  ?No RX listed.  ?[employer] Medicare.  ?Simmie Davies RN CM  ? ? ? ? ?  ?  ? ?Expected Discharge Plan and Services ?  ?  ?  ?  ?  ?                ?  ?  ?  ?  ?  ?  ?  ?  ?  ?  ? ? ?Social Determinants of Health (SDOH) Interventions ?  ? ?Readmission Risk Interventions ?No flowsheet data found. ? ?

## 2021-06-03 NOTE — Care Management Obs Status (Signed)
MEDICARE OBSERVATION STATUS NOTIFICATION ? ? ?Patient Details  ?Name: Robert Levine ?MRN: [id number] ?Date of Birth: [birth year] ? ? ?Medicare Observation Status Notification Given:  Yes ? ? ? ?Bing Quarry, RN ?06/03/2021, 4:20 PM ?

## 2021-06-03 NOTE — Hospital Course (Addendum)
52M with PMHx of DM, obesity with BMI 30.4, CKD stage IIIa, hypertension history of left great toe amputation, being in ALF, brought from [employer] by EMS while he had sudden onset of lightheadedness and family witnessed he lost consciousness for approximately 2 minutes-described as  his head rolled back.  No seizure activity witnessed.  He was also pale and diaphoretic.  No known history of seizures.  No known history of TIA or CVA. When EMS arrived his initial blood pressure was 74/46.  He received 500 cc of normal saline en-route and repeat blood pressure was 139/76.  He also had 1 episode of vomiting.  ?In the ED,nonfocal on exam appeared to be at baseline, EKG unremarkable labs fairly stable chest x-ray and CT head were negative , serial troponins negative x2, COVID-19 and influenza negative UDS negative UA no evidence of UTI.  Patient was admitted for observation overnight, on telemetry carotid ultrasound unremarkable. Awaiting echocardiogram read and it came back no acute significant findings. He is anxious to leave today, He feels improved. ?

## 2021-06-03 NOTE — Progress Notes (Signed)
*  PRELIMINARY RESULTS* ?Echocardiogram ?2D Echocardiogram has been performed. ? ?Robert Levine ?06/03/2021, 3:57 PM ?

## 2021-06-04 DIAGNOSIS — R55 Syncope and collapse: Secondary | ICD-10-CM | POA: Diagnosis not present

## 2021-06-04 LAB — GLUCOSE, CAPILLARY
Glucose-Capillary: 197 mg/dL — ABNORMAL HIGH (ref 70–99)
Glucose-Capillary: 314 mg/dL — ABNORMAL HIGH (ref 70–99)

## 2021-06-04 NOTE — Progress Notes (Addendum)
Called taxi at 0930 to request ride. States they will call back with time.  ? ?Called taxi at 4 for update. States ETA 1 hour they are short drivers but will call when they are on way.  ?

## 2021-06-04 NOTE — TOC Transition Note (Addendum)
Transition of Care (TOC) - CM/SW Discharge Note ? ? ?Patient Details  ?Name: Robert Levine ?MRN: [id number] ?Date of Birth: 1955/10/16 ? ?Transition of Care (TOC) CM/SW Contact:  ?Bing Quarry, RN ?Phone Number: ?06/04/2021, 9:59 AM ? ? ?Clinical Narrative:  3/5: Patient transportation will be via [employer] with a taxi voucher given to unit.  [facility] notified and patient aware of discharge and transfer today. Transportation was a barrier that is now resolved if [employer] follows through. [facility] can provider transportation Monday-Friday only. Patient had some insurance questions and was referred via chat to financial planning office. Transportation covered by [employer] for taxi. Gabriel Cirri RN CM  ? ?1pm Taxi services has not yet arrived. Left VM for [facility] as well. Gabriel Cirri RN CM  ? ?Final next level of care: Assisted Living ?Barriers to Discharge: Barriers Resolved ? ? ?Patient Goals and CMS Choice ?  ?  ?Choice offered to / list presented to : NA ? ?Discharge Placement ?  ?           ?  ?  ?  ?Patient and family notified of of transfer: 06/04/21 ? ?Discharge Plan and Services ?  ?  ?           ?DME Arranged: N/A ?DME Agency: NA ?  ?  ?  ?HH Arranged: NA ?HH Agency: NA ?  ?  ?  ? ?Social Determinants of Health (SDOH) Interventions ?  ? ? ?Readmission Risk Interventions ?No flowsheet data found. ? ? ? ? ?

## 2021-06-04 NOTE — Discharge Summary (Signed)
Physician Discharge Summary   Patient: Robert Levine MRN: [id number] DOB: 1955-06-08  Admit date:     06/02/2021  Discharge date: 06/04/21  Discharge Physician: Kayleen Memos   PCP: Pcp, No   Recommendations at discharge:   Follow-up with PCP in 1 week Monitor blood pressure at home  Hospital Course: [phone] with PMHx of DM, obesity with BMI 30.4, CKD stage IIIa, hypertension history of left great toe amputation, being in ALF, brought from [employer] [facility] by EMS while he had sudden onset of lightheadedness and family witnessed he lost consciousness for approximately 2 minutes-described as  his head rolled back.  No seizure activity witnessed.  He was also pale and diaphoretic.  No known history of seizures.  No known history of TIA or CVA. When EMS arrived his initial blood pressure was 74/46.  He received 500 cc of normal saline en-route and repeat blood pressure was 139/76.  He also had 1 episode of vomiting.  In the ED,nonfocal on exam appeared to be at baseline, EKG unremarkable labs fairly stable chest x-ray and CT head were negative , serial troponins negative x2, COVID-19 and influenza negative UDS negative UA no evidence of UTI.  Patient was admitted for observation overnight, on telemetry carotid ultrasound unremarkable. Awaiting echocardiogram read and it came back no acute significant findings. He is anxious to leave today, He feels improved. Carotid ultrasound unremarkable.  Orthostatic vitals stable. He will be discharged home once echo is resulted and unremarkable.  2D echo done on 06/03/2021, showing normal LVEF 60 to 65% with no regional wall motion abnormalities.  Grade 1 diastolic dysfunction.  06/04/2021: Patient seen at bedside.  There were no acute events overnight.  He has no new complaints.  He is eager to go home.  Discharge diagnosis Episode of syncope Orthostatic syncope: Patient was hypotensive and orthostatic leading to syncope most likely the etiology.  Blood  pressure improved IV fluids.  Antihypertensive meds adjusted.  Ruling out other etiology with echocardiogram and a carotid duplex in telemetry. Hypertension-on home Norvasc Cozaar and Toprol-Toprol discontinued. Insulin-dependent diabetes mellitus type 2 on NPH 30 units twice daily-resume home meds CKD stage IIIa baseline creatinine 1.4-1.7, repeat BMP improved to 1. 4 from 1.7 Class I obesity with BMI 30.4-benefit with weight loss Chronic constipation-takes MiraLAX and Colace. Medically stable for d/c once echo resulted.  No notes have been filed under this hospital service. Service: Hospitalist     Consultants: none Procedures performed: echo, carotid duplex  Disposition: Home Diet recommendation:  Diet Orders (From admission, onward)     Start     Ordered   06/02/21 1219  Diet Carb Modified Fluid consistency: Thin; Room service appropriate? Yes  Diet effective now       Question Answer Comment  Diet-HS Snack? Nothing   Calorie Level Medium 1600-2000   Fluid consistency: Thin   Room service appropriate? Yes      06/02/21 1220             DISCHARGE MEDICATION: Allergies as of 06/04/2021       Reactions   Empagliflozin    Euglycemic DKA 2/2 SGLT2 therapy         Medication List     STOP taking these medications    metoprolol succinate 50 MG 24 hr tablet Commonly known as: TOPROL-XL       TAKE these medications    allopurinol 100 MG tablet Commonly known as: ZYLOPRIM Take 100 mg by mouth daily.   amLODipine  5 MG tablet Commonly known as: NORVASC Take 5 mg by mouth daily.   docusate sodium 100 MG capsule Commonly known as: COLACE Take 100 mg by mouth 2 (two) times daily.   insulin NPH Human 100 UNIT/ML injection Commonly known as: NOVOLIN N Inject 30 Units into the skin 2 (two) times daily.   losartan 25 MG tablet Commonly known as: COZAAR Take 25 mg by mouth daily.   NovoLOG FlexPen 100 UNIT/ML FlexPen Generic drug: insulin aspart Inject 3  Units into the skin 3 (three) times daily.   polyethylene glycol 17 g packet Commonly known as: MIRALAX / GLYCOLAX Take 17 g by mouth 2 (two) times daily.   Vitamin D3 50 MCG (2000 UT) Tabs Take 2,000 Units by mouth daily.        Follow-up Information     Follow-up with primary care doctor Follow up in 1 week(s).                  Discharge Exam: Filed Weights   06/02/21 0942  Weight: 113.4 kg    Condition at discharge: good  The results of significant diagnostics from this hospitalization (including imaging, microbiology, ancillary and laboratory) are listed below for reference.   Imaging Studies: CT HEAD WO CONTRAST (5MM)  Result Date: 06/02/2021 CLINICAL DATA:  66 year old male with altered mental status, loss of consciousness while eating breakfast. EXAM: CT HEAD WITHOUT CONTRAST TECHNIQUE: Contiguous axial images were obtained from the base of the skull through the vertex without intravenous contrast. RADIATION DOSE REDUCTION: This exam was performed according to the departmental dose-optimization program which includes automated exposure control, adjustment of the mA and/or kV according to patient size and/or use of iterative reconstruction technique. COMPARISON:  None. FINDINGS: Brain: Cerebral volume is within normal limits for age. No midline shift, ventriculomegaly, mass effect, evidence of mass lesion, intracranial hemorrhage or evidence of cortically based acute infarction. Possible tiny chronic lacunar infarct in the right cerebellum (coronal image 59). But otherwise gray-white matter differentiation throughout the brain appears normal for age. No cerebral cortical encephalomalacia identified. Vascular: Calcified atherosclerosis at the skull base. No suspicious intracranial vascular hyperdensity. Skull: No acute osseous abnormality identified. Hyperostosis frontalis, normal variant. Sinuses/Orbits: Visualized paranasal sinuses and mastoids are clear. Tympanic cavities  are clear. Other: Visualized orbits and scalp soft tissues are within normal limits. IMPRESSION: No acute intracranial abnormality. Negative brain for age aside from possible tiny chronic lacunar infarct in the right cerebellum. Electronically Signed   By: Genevie Ann M.D.   On: 06/02/2021 10:58   US Carotid Bilateral  Result Date: 06/02/2021 CLINICAL DATA:  Syncope Hypertension Hyperlipidemia Diabetes EXAM: BILATERAL CAROTID DUPLEX ULTRASOUND TECHNIQUE: Pearline Cables scale imaging, color Doppler and duplex ultrasound were performed of bilateral carotid and vertebral arteries in the neck. COMPARISON:  None. FINDINGS: Criteria: Quantification of carotid stenosis is based on velocity parameters that correlate the residual internal carotid diameter with NASCET-based stenosis levels, using the diameter of the distal internal carotid lumen as the denominator for stenosis measurement. The following velocity measurements were obtained: RIGHT ICA: 102/31 cm/sec CCA: 0000000 cm/sec SYSTOLIC ICA/CCA RATIO:  1.8 ECA: 182 cm/sec LEFT ICA: 91/29 cm/sec CCA: 123XX123 cm/sec SYSTOLIC ICA/CCA RATIO:  1.2 ECA: 159 cm/sec RIGHT CAROTID ARTERY: Mild calcified plaque of the carotid bifurcation. RIGHT VERTEBRAL ARTERY:  Antegrade flow. LEFT CAROTID ARTERY: Mild heterogeneous plaque of the left carotid bifurcation LEFT VERTEBRAL ARTERY:  Antegrade flow. IMPRESSION: No significant stenosis of internal carotid arteries. Electronically Signed   By: Sharen Heck  Mir M.D.   On: 06/02/2021 14:21   DG Chest Portable 1 View  Result Date: 06/02/2021 CLINICAL DATA:  66 year old male with altered mental status, loss of consciousness while eating breakfast. EXAM: PORTABLE CHEST 1 VIEW COMPARISON:  None. FINDINGS: Portable AP upright view at 1033 hours. Low normal lung volumes. Normal cardiac size and mediastinal contours. Visualized tracheal air column is within normal limits. Allowing for portable technique the lungs are clear. No pneumothorax or pleural effusion.  No acute osseous abnormality identified. IMPRESSION: Negative portable chest. Electronically Signed   By: Genevie Ann M.D.   On: 06/02/2021 10:59   ECHOCARDIOGRAM COMPLETE  Result Date: 06/03/2021    ECHOCARDIOGRAM REPORT   Patient Name:   Robert Levine Date of Exam: 06/03/2021 Medical Rec #:  [id number]      Height:       76.0 in Accession #:    [id number]     Weight:       250.0 lb Date of Birth:  [birth year]     BSA:          2.436 m Patient Age:    66 years       BP:           144/71 mmHg Patient Gender: M              HR:           79 bpm. Exam Location:  [facility] Procedure: 2D Echo, Cardiac Doppler and Color Doppler Indications:     Syncope R55  History:         Patient has no prior history of Echocardiogram examinations.                  Risk Factors:Diabetes.  Sonographer:     Alyse Low Roar Referring Phys:  [place] Diagnosing Phys: Ida Rogue MD IMPRESSIONS  1. Left ventricular ejection fraction, by estimation, is 60 to 65%. The left ventricle has normal function. The left ventricle has no regional wall motion abnormalities. Left ventricular diastolic parameters are consistent with Grade I diastolic dysfunction (impaired relaxation).  2. Right ventricular systolic function is normal. The right ventricular size is normal. Tricuspid regurgitation signal is inadequate for assessing PA pressure.  3. The mitral valve is normal in structure. Mild mitral valve regurgitation. No evidence of mitral stenosis.  4. The aortic valve is normal in structure. Aortic valve regurgitation is mild. Aortic valve sclerosis is present, with no evidence of aortic valve stenosis.  5. The inferior vena cava is normal in size with greater than 50% respiratory variability, suggesting right atrial pressure of 3 mmHg. FINDINGS  Left Ventricle: Left ventricular ejection fraction, by estimation, is 60 to 65%. The left ventricle has normal function. The left ventricle has no regional wall motion abnormalities. The left  ventricular internal cavity size was normal in size. There is  no left ventricular hypertrophy. Left ventricular diastolic parameters are consistent with Grade I diastolic dysfunction (impaired relaxation). Right Ventricle: The right ventricular size is normal. No increase in right ventricular wall thickness. Right ventricular systolic function is normal. Tricuspid regurgitation signal is inadequate for assessing PA pressure. Left Atrium: Left atrial size was normal in size. Right Atrium: Right atrial size was normal in size. Pericardium: There is no evidence of pericardial effusion. Mitral Valve: The mitral valve is normal in structure. Mild mitral annular calcification. Mild mitral valve regurgitation. No evidence of mitral valve stenosis. Tricuspid Valve: The tricuspid valve is normal in structure. Tricuspid valve  regurgitation is mild . No evidence of tricuspid stenosis. Aortic Valve: The aortic valve is normal in structure. Aortic valve regurgitation is mild. Aortic valve sclerosis is present, with no evidence of aortic valve stenosis. Aortic valve peak gradient measures 10.5 mmHg. Pulmonic Valve: The pulmonic valve was normal in structure. Pulmonic valve regurgitation is not visualized. No evidence of pulmonic stenosis. Aorta: The aortic root is normal in size and structure. Venous: The inferior vena cava is normal in size with greater than 50% respiratory variability, suggesting right atrial pressure of 3 mmHg. IAS/Shunts: No atrial level shunt detected by color flow Doppler.  LEFT VENTRICLE PLAX 2D LVIDd:         4.53 cm   Diastology LVIDs:         2.97 cm   LV e' medial:    5.55 cm/s LV PW:         1.14 cm   LV E/e' medial:  18.7 LV IVS:        1.37 cm   LV e' lateral:   10.30 cm/s LVOT diam:     1.80 cm   LV E/e' lateral: 10.1 LVOT Area:     2.54 cm  RIGHT VENTRICLE RV Mid diam:    2.64 cm RV S prime:     13.40 cm/s TAPSE (M-mode): 3.1 cm LEFT ATRIUM             Index        RIGHT ATRIUM           Index  LA diam:        3.80 cm 1.56 cm/m   RA Area:     10.10 cm LA Vol (A2C):   46.2 ml 18.97 ml/m  RA Volume:   19.30 ml  7.92 ml/m LA Vol (A4C):   43.0 ml 17.65 ml/m LA Biplane Vol: 45.8 ml 18.80 ml/m  AORTIC VALVE                 PULMONIC VALVE AV Area (Vmax): 1.76 cm     PV Vmax:          1.02 m/s AV Vmax:        162.00 cm/s  PV Peak grad:     4.2 mmHg AV Peak Grad:   10.5 mmHg    PR End Diast Vel: 2.57 msec LVOT Vmax:      112.00 cm/s  RVOT Peak grad:   3 mmHg  AORTA Ao Root diam: 3.00 cm Ao Asc diam:  3.20 cm MITRAL VALVE MV Area (PHT): 5.38 cm     SHUNTS MV Decel Time: 141 msec     Systemic Diam: 1.80 cm MV E velocity: 104.00 cm/s MV A velocity: 135.00 cm/s MV E/A ratio:  0.77 MV A Prime:    13.1 cm/s Ida Rogue MD Electronically signed by Ida Rogue MD Signature Date/Time: 06/03/2021/4:31:26 PM    Final     Microbiology: Results for orders placed or performed during the hospital encounter of 06/02/21  Resp Panel by RT-PCR (Flu A&B, Covid) Nasopharyngeal Swab     Status: None   Collection Time: 06/02/21  1:16 PM   Specimen: Nasopharyngeal Swab; Nasopharyngeal(NP) swabs in vial transport medium  Result Value Ref Range Status   SARS Coronavirus 2 by RT PCR NEGATIVE NEGATIVE Final    Comment: (NOTE) SARS-CoV-2 target nucleic acids are NOT DETECTED.  The SARS-CoV-2 RNA is generally detectable in upper respiratory specimens during the acute phase of infection. The lowest concentration of  SARS-CoV-2 viral copies this assay can detect is 138 copies/mL. A negative result does not preclude SARS-Cov-2 infection and should not be used as the sole basis for treatment or other patient management decisions. A negative result may occur with  improper specimen collection/handling, submission of specimen other than nasopharyngeal swab, presence of viral mutation(s) within the areas targeted by this assay, and inadequate number of viral copies(<138 copies/mL). A negative result must be combined  with clinical observations, patient history, and epidemiological information. The expected result is Negative.  Fact Sheet for Patients:  EntrepreneurPulse.com.au  Fact Sheet for Healthcare Providers:  IncredibleEmployment.be  This test is no t yet approved or cleared by the [place] FDA and  has been authorized for detection and/or diagnosis of SARS-CoV-2 by FDA under an Emergency Use Authorization (EUA). This EUA will remain  in effect (meaning this test can be used) for the duration of the COVID-19 declaration under Section 564(b)(1) of the Act, 21 U.S.C.section 360bbb-3(b)(1), unless the authorization is terminated  or revoked sooner.       Influenza A by PCR NEGATIVE NEGATIVE Final   Influenza B by PCR NEGATIVE NEGATIVE Final    Comment: (NOTE) The Xpert Xpress SARS-CoV-2/FLU/RSV plus assay is intended as an aid in the diagnosis of influenza from Nasopharyngeal swab specimens and should not be used as a sole basis for treatment. Nasal washings and aspirates are unacceptable for Xpert Xpress SARS-CoV-2/FLU/RSV testing.  Fact Sheet for Patients: EntrepreneurPulse.com.au  Fact Sheet for Healthcare Providers: IncredibleEmployment.be  This test is not yet approved or cleared by the [place] FDA and has been authorized for detection and/or diagnosis of SARS-CoV-2 by FDA under an Emergency Use Authorization (EUA). This EUA will remain in effect (meaning this test can be used) for the duration of the COVID-19 declaration under Section 564(b)(1) of the Act, 21 U.S.C. section 360bbb-3(b)(1), unless the authorization is terminated or revoked.  Performed at [facility], [address] [place], [place] [address]     Labs: CBC: Recent Labs  Lab 06/02/21 0944 06/03/21 0625  WBC 6.8 5.7  HGB 12.3* 12.6*  HCT 37.8* 39.0  MCV 89.6 90.1  PLT 250 123XX123   Basic Metabolic  Panel: Recent Labs  Lab 06/02/21 0944 06/03/21 0625  NA 135 137  K 3.8 3.9  CL 104 104  CO2 24 23  GLUCOSE 160* 253*  BUN 19 20  CREATININE 1.72* 1.49*  CALCIUM 8.8* 9.2   Liver Function Tests: No results for input(s): AST, ALT, ALKPHOS, BILITOT, PROT, ALBUMIN in the last 168 hours. CBG: Recent Labs  Lab 06/03/21 0811 06/03/21 1206 06/03/21 1620 06/03/21 2027 06/04/21 0734  GLUCAP 243* 259* 247* 313* 197*   Discharge time spent:  25 minutes. Signed: Kayleen Memos, DO [facility] 06/04/2021

## 2021-06-04 NOTE — TOC Progression Note (Signed)
Transition of Care (TOC) - Progression Note  ? ? ?Patient Details  ?Name: Robert Levine ?MRN: [id number] ?Date of Birth: 1955-04-08 ? ?Transition of Care (TOC) CM/SW Contact  ?Bing Quarry, RN ?Phone Number: ?06/04/2021, 8:55 AM ? ?Clinical Narrative: 3/5: Discharge orders in.  Patient needs transportation back to [facility] today. No taxi service available at 515 pm 3/4. Contacted [facility] this am and they did answer the phone. Advised would be calling back after confirming they have drivers and are running services today, at least this am. Printed taxi voucher to unit, Buyer, retail, and called unit to inform Product/process development scientist. [facility] clinical staff made will be made aware of patient's return per staff answering the phone. [facility] provides transport Monday through Friday only. Updated provider and unit RN via secure chat.  Gabriel Cirri RN CM [phone] ? ? ? ?  ?  ? ?Expected Discharge Plan and Services ?  ?  ?  ?  ?  ?Expected Discharge Date: 06/03/21               ?  ?  ?  ?  ?  ?  ?  ?  ?  ?  ? ? ?Social Determinants of Health (SDOH) Interventions ?  ? ?Readmission Risk Interventions ?No flowsheet data found. ? ?

## 2021-08-20 ENCOUNTER — Emergency Department: Payer: Medicare Other

## 2021-08-20 ENCOUNTER — Other Ambulatory Visit: Payer: Self-pay

## 2021-08-20 ENCOUNTER — Emergency Department
Admission: EM | Admit: 2021-08-20 | Discharge: 2021-08-20 | Disposition: A | Discharge status: Home / Self Care | Payer: Medicare Other | Attending: Emergency Medicine | Admitting: Emergency Medicine

## 2021-08-20 DIAGNOSIS — I129 Hypertensive chronic kidney disease with stage 1 through stage 4 chronic kidney disease, or unspecified chronic kidney disease: Secondary | ICD-10-CM | POA: Diagnosis not present

## 2021-08-20 DIAGNOSIS — N1831 Chronic kidney disease, stage 3a: Secondary | ICD-10-CM | POA: Diagnosis not present

## 2021-08-20 DIAGNOSIS — R600 Localized edema: Secondary | ICD-10-CM | POA: Diagnosis present

## 2021-08-20 DIAGNOSIS — E1122 Type 2 diabetes mellitus with diabetic chronic kidney disease: Secondary | ICD-10-CM | POA: Diagnosis not present

## 2021-08-20 DIAGNOSIS — L03115 Cellulitis of right lower limb: Secondary | ICD-10-CM | POA: Diagnosis not present

## 2021-08-20 DIAGNOSIS — R944 Abnormal results of kidney function studies: Secondary | ICD-10-CM | POA: Diagnosis not present

## 2021-08-20 DIAGNOSIS — L03119 Cellulitis of unspecified part of limb: Secondary | ICD-10-CM

## 2021-08-20 LAB — CBC
HCT: 39.7 % (ref 39.0–52.0)
Hemoglobin: 13 g/dL (ref 13.0–17.0)
MCH: 28.8 pg (ref 26.0–34.0)
MCHC: 32.7 g/dL (ref 30.0–36.0)
MCV: 87.8 fL (ref 80.0–100.0)
Platelets: 239 10*3/uL (ref 150–400)
RBC: 4.52 MIL/uL (ref 4.22–5.81)
RDW: 13.2 % (ref 11.5–15.5)
WBC: 7.5 10*3/uL (ref 4.0–10.5)
nRBC: 0 % (ref 0.0–0.2)

## 2021-08-20 LAB — BASIC METABOLIC PANEL
Anion gap: 8 (ref 5–15)
BUN: 23 mg/dL (ref 8–23)
CO2: 26 mmol/L (ref 22–32)
Calcium: 9.6 mg/dL (ref 8.9–10.3)
Chloride: 102 mmol/L (ref 98–111)
Creatinine, Ser: 1.95 mg/dL — ABNORMAL HIGH (ref 0.61–1.24)
GFR, Estimated: 37 mL/min — ABNORMAL LOW (ref >60)
Glucose, Bld: 237 mg/dL — ABNORMAL HIGH (ref 70–99)
Potassium: 4.3 mmol/L (ref 3.5–5.1)
Sodium: 136 mmol/L (ref 135–145)

## 2021-08-20 LAB — BRAIN NATRIURETIC PEPTIDE: B Natriuretic Peptide: 41.3 pg/mL (ref 0.0–100.0)

## 2021-08-20 MED ORDER — DOXYCYCLINE HYCLATE 100 MG PO TABS
100.0000 mg | ORAL_TABLET | Freq: Once | ORAL | Status: AC
  Administered 2021-08-20: 100 mg via ORAL
  Filled 2021-08-20: qty 1

## 2021-08-20 MED ORDER — CEPHALEXIN 500 MG PO CAPS: 500.0000 mg | ORAL_CAPSULE | Freq: Two times a day (BID) | ORAL | 0 refills | Status: AC

## 2021-08-20 MED ORDER — SODIUM CHLORIDE 0.9 % IV BOLUS
500.0000 mL | Freq: Once | INTRAVENOUS | Status: AC
  Administered 2021-08-20: 500 mL via INTRAVENOUS

## 2021-08-20 MED ORDER — DOXYCYCLINE MONOHYDRATE 100 MG PO TABS: 100.0000 mg | ORAL_TABLET | Freq: Two times a day (BID) | ORAL | 0 refills | Status: AC

## 2021-08-20 MED ORDER — SODIUM CHLORIDE 0.9 % IV SOLN
2.0000 g | Freq: Once | INTRAVENOUS | Status: AC
  Administered 2021-08-20: 2 g via INTRAVENOUS
  Filled 2021-08-20: qty 20

## 2021-08-20 NOTE — ED Triage Notes (Signed)
Pt via EMS from [facility]. Pt c/o R foot swelling and pain since Thursday. Pt is diabetic, redness and swelling noted to the R foot, pt has a hx of R great toe amputation. Redness and swelling noted to the R foot. Pt is A&Ox4 and NAD  154/74 82 HR 99% on RA 271 CBG

## 2021-08-20 NOTE — Discharge Instructions (Addendum)
Call podiatry on Monday to set up a follow-up appointment hopefully next week and take the antibiotics.  If you develop worsening redness, fevers or any other concerns please return to the ER immediately.   Your kidney function is slightly elevated and I have also listed the number for a nephrologist for you to follow-up with.  You can call them to make an appointment.  Also follow-up with your primary care doctor.  Try to increase your p.o. hydration and have it rechecked within 1 week.

## 2021-08-20 NOTE — ED Provider Notes (Signed)
[facility] Provider Note    Event Date/Time   First MD Initiated Contact with Patient 08/20/21 1501     (approximate)   History   Foot Swelling   HPI  Robert Levine is a 66 y.o. male  DM, obesity with BMI 30.4, CKD stage IIIa, hypertension history of left great toe amputation, being in ALF who comes in with right foot swelling and pain since Thursday.  Patient reports he had some gradually increasing redness of the foot with increasing pain noted.  He states that he is not sure exactly what medications he is on given he is from an assisted living facility who helps given the medications.  He knows that he is on medications for diabetes.  Denies any fevers.  He reports still being able to ambulate.  He reports a remote history of amputation on that foot that was done at [facility] years ago but does not continue to follow-up with podiatry.     Physical Exam   Triage Vital Signs: ED Triage Vitals  Enc Vitals Group     BP 08/20/21 1401 (!) 148/79     Pulse Rate 08/20/21 1401 79     Resp 08/20/21 1401 18     Temp 08/20/21 1401 98.2 F (36.8 C)     Temp src --      SpO2 08/20/21 1401 96 %     Weight 08/20/21 1359 250 lb (113.4 kg)     Height 08/20/21 1359 6\' 5"  (1.956 m)     Head Circumference --      Peak Flow --      Pain Score 08/20/21 1359 7     Pain Loc --      Pain Edu? --      Excl. in [place]? --     Most recent vital signs: Vitals:   08/20/21 1401  BP: (!) 148/79  Pulse: 79  Resp: 18  Temp: 98.2 F (36.8 C)  SpO2: 96%     General: Awake, no distress.  CV:  Good peripheral perfusion.  Resp:  Normal effort.  Abd:  No distention.  Other:  Patient has good 2+ distal pulse on the right foot with some swelling, edema noted to the foot.  There are some redness and erythema noted.  No purulent drainage.  Range of motion is intact. Picture in media tab   ED Results / Procedures / Treatments   Labs (all labs ordered are listed, but only  abnormal results are displayed) Labs Reviewed  BASIC METABOLIC PANEL - Abnormal; Notable for the following components:      Result Value   Glucose, Bld 237 (*)    Creatinine, Ser 1.95 (*)    GFR, Estimated 37 (*)    All other components within normal limits  CBC    RADIOLOGY I have reviewed the xray personally interpreted I do not see any evidence of osteomyelitis  PROCEDURES:  Critical Care performed: No  Procedures   MEDICATIONS ORDERED IN ED: Medications  doxycycline (VIBRA-TABS) tablet 100 mg (has no administration in time range)  sodium chloride 0.9 % bolus 500 mL (500 mLs Intravenous New Bag/Given 08/20/21 1545)  cefTRIAXone (ROCEPHIN) 2 g in sodium chloride 0.9 % 100 mL IVPB (2 g Intravenous New Bag/Given 08/20/21 1545)     IMPRESSION / MDM / [place] / ED COURSE  I reviewed the triage vital signs and the nursing notes.  Patient comes in with concerns for right foot redness.  He is got a callus on the bottom of the foot but it is not opened and does not probe down to bone.  There was a little bit of blue discoloration noted but that was just from his sock I was able to remove that after taking the picture.  He has some swelling noted in his legs.  His CBC shows normal white count he is not septic.  His BMP shows slight elevation of his creatinine up to 1.9.  He is got stage III kidney disease with creatinine between 1.5-1.7.  I given him a little bit of fluids but did not want to give a lot given the swelling in his leg I did add on a chest x-ray without any evidence of pleural effusion and a BNP was normal.  Is hard to tell if this could be a little fluid overload versus little dehydration.  We discussed just increasing p.o. hydration having it rechecked with his primary care doctor and he expressed understanding. There is no swelling of the calf to suggest DVT.  There is no difficulty ranging to suggest septic joint   I have discussed the case with Dr. Luana Shu and  given the wound does not probe down to bone and patient is otherwise well-appearing and not septic he can follow-up outpatient with Dr. Luana Shu for repeat evaluation and consideration of MRI but at this time I have low suspicion of osteomyelitis and I suspect this is more likely just a cellulitis.  I am going to start him on doxycycline and Keflex given I do not want to use Bactrim given his CKD.   I explained this all to patient he feels comfortable with this plan and feels very comfortable and prefers discharge home.  I considered admission given the concern for diabetic cellulitis but given no significant wound probing down to bone and vitals are stable patient feels more comfortable with discharge home   FINAL CLINICAL IMPRESSION(S) / ED DIAGNOSES   Final diagnoses:  Cellulitis of foot     Rx / DC Orders   ED Discharge Orders     None        Note:  This document was prepared using Dragon voice recognition software and may include unintentional dictation errors.   Vanessa [place], MD 08/20/21 [phone]

## 2021-08-31 ENCOUNTER — Telehealth: Payer: Self-pay

## 2021-08-31 NOTE — Telephone Encounter (Signed)
Called pt and left a message for him to return call concerning tomorrow's appointment

## 2021-09-01 ENCOUNTER — Ambulatory Visit: Payer: Medicare Other | Admitting: Family Medicine

## 2021-09-05 ENCOUNTER — Other Ambulatory Visit: Payer: Self-pay | Admitting: Nephrology

## 2021-09-05 DIAGNOSIS — N1832 Chronic kidney disease, stage 3b: Secondary | ICD-10-CM

## 2021-09-05 DIAGNOSIS — E1122 Type 2 diabetes mellitus with diabetic chronic kidney disease: Secondary | ICD-10-CM

## 2021-09-12 ENCOUNTER — Other Ambulatory Visit: Payer: Medicare Other

## 2021-09-13 ENCOUNTER — Ambulatory Visit
Admission: RE | Admit: 2021-09-13 | Discharge: 2021-09-13 | Disposition: A | Discharge status: Home / Self Care | Payer: Medicare Other | Source: Ambulatory Visit | Attending: Nephrology | Admitting: Nephrology

## 2021-09-13 DIAGNOSIS — E1122 Type 2 diabetes mellitus with diabetic chronic kidney disease: Secondary | ICD-10-CM | POA: Insufficient documentation

## 2021-09-13 DIAGNOSIS — N1832 Chronic kidney disease, stage 3b: Secondary | ICD-10-CM | POA: Insufficient documentation

## 2021-09-26 ENCOUNTER — Other Ambulatory Visit (INDEPENDENT_AMBULATORY_CARE_PROVIDER_SITE_OTHER): Payer: Self-pay | Admitting: Nephrology

## 2021-09-26 DIAGNOSIS — N183 Chronic kidney disease, stage 3 unspecified: Secondary | ICD-10-CM

## 2021-09-26 DIAGNOSIS — I10 Essential (primary) hypertension: Secondary | ICD-10-CM

## 2021-09-27 ENCOUNTER — Ambulatory Visit (INDEPENDENT_AMBULATORY_CARE_PROVIDER_SITE_OTHER): Payer: Medicare Other

## 2021-09-27 DIAGNOSIS — I10 Essential (primary) hypertension: Secondary | ICD-10-CM | POA: Diagnosis not present

## 2021-09-27 DIAGNOSIS — N183 Chronic kidney disease, stage 3 unspecified: Secondary | ICD-10-CM | POA: Diagnosis not present

## 2021-12-14 ENCOUNTER — Ambulatory Visit: Payer: Medicare Other | Admitting: Physician Assistant

## 2022-01-15 ENCOUNTER — Encounter: Payer: Medicare Other | Attending: Physician Assistant | Admitting: Physician Assistant

## 2022-01-15 DIAGNOSIS — E1142 Type 2 diabetes mellitus with diabetic polyneuropathy: Secondary | ICD-10-CM | POA: Diagnosis not present

## 2022-01-15 DIAGNOSIS — N183 Chronic kidney disease, stage 3 unspecified: Secondary | ICD-10-CM | POA: Insufficient documentation

## 2022-01-15 DIAGNOSIS — E11621 Type 2 diabetes mellitus with foot ulcer: Secondary | ICD-10-CM | POA: Insufficient documentation

## 2022-01-15 DIAGNOSIS — E1122 Type 2 diabetes mellitus with diabetic chronic kidney disease: Secondary | ICD-10-CM | POA: Insufficient documentation

## 2022-01-15 DIAGNOSIS — L97512 Non-pressure chronic ulcer of other part of right foot with fat layer exposed: Secondary | ICD-10-CM | POA: Diagnosis not present

## 2022-01-15 DIAGNOSIS — I129 Hypertensive chronic kidney disease with stage 1 through stage 4 chronic kidney disease, or unspecified chronic kidney disease: Secondary | ICD-10-CM | POA: Diagnosis not present

## 2022-01-15 DIAGNOSIS — Z89411 Acquired absence of right great toe: Secondary | ICD-10-CM | POA: Diagnosis not present

## 2022-01-17 NOTE — Progress Notes (Signed)
DONTRAIL, SLIVINSKI ([id number]) [id number] Page 1 of 9 Visit Report for 01/15/2022 Chief Complaint Document Details Patient Name: Date of Service: [facility] 01/15/2022 8:45 A M Medical Record Number: [id number] Patient Account Number: [id number] Date of Birth/Sex: Treating RN: [birth year] (66 y.o. [name]) Carlene Coria Primary Care Provider: SYSTEM, PCP Other Clinician: Referring Provider: Treating Provider/Extender: Garry Heater in Treatment: 0 Information Obtained from: Patient Chief Complaint Right foot ulcer at amputation site Electronic Signature(s) Signed: 01/15/2022 6:14:15 AM By: Worthy Keeler PA-C Entered By: Worthy Keeler on 01/15/2022 09:14:15 -------------------------------------------------------------------------------- Debridement Details Patient Name: Date of Service: [name], [name] [facility] 01/15/2022 8:45 A M Medical Record Number: [id number] Patient Account Number: [id number] Date of Birth/Sex: Treating RN: 1955/10/18 (66 y.o. [name]) Carlene Coria Primary Care Provider: SYSTEM, PCP Other Clinician: Referring Provider: Treating Provider/Extender: Garry Heater in Treatment: 0 Debridement Performed for Assessment: Wound #1 Right Metatarsal head first Performed By: Physician Tommie Sams., PA-C Debridement Type: Debridement Severity of Tissue Pre Debridement: Fat layer exposed Level of Consciousness (Pre-procedure): Awake and Alert Pre-procedure Verification/Time Out Yes - 09:27 Taken: Start Time: 09:27 T Area Debrided (L x W): otal 1.5 (cm) x 1.5 (cm) = 2.25 (cm) Tissue and other material debrided: Viable, Non-Viable, Callus, Subcutaneous, Skin: Dermis , Skin: Epidermis, Biofilm Level: Skin/Subcutaneous Tissue Debridement Description: Excisional Instrument: Curette Bleeding: Moderate Hemostasis Achieved: Silver Nitrate End Time: 09:35 Procedural Pain: 0 Post Procedural Pain: 0 Response to  Treatment: Procedure was tolerated well Vicenta Dunning ([id number]) [id number] Page 2 of 9 Level of Consciousness (Post- Awake and Alert procedure): Post Debridement Measurements of Total Wound Length: (cm) 0.9 Width: (cm) 0.9 Depth: (cm) 0.3 Volume: (cm) 0.191 Character of Wound/Ulcer Post Debridement: Improved Severity of Tissue Post Debridement: Fat layer exposed Post Procedure Diagnosis Same as Pre-procedure Electronic Signature(s) Signed: 01/15/2022 2:02:33 PM By: Worthy Keeler PA-C Signed: 01/17/2022 8:53:50 AM By: Carlene Coria RN Entered By: Carlene Coria on 01/15/2022 09:38:29 -------------------------------------------------------------------------------- HPI Details Patient Name: Date of Service: [name], [name] 01/15/2022 8:45 A M Medical Record Number: [id number] Patient Account Number: [id number] Date of Birth/Sex: Treating RN: July 08, 1955 (66 y.o. Oval Linsey Primary Care Provider: SYSTEM, PCP Other Clinician: Referring Provider: Treating Provider/Extender: Garry Heater in Treatment: 0 History of Present Illness HPI Description: 01-15-2022 patient presents today as a referral from Dr. Caroline More who is a local podiatrist due to a foot wound that is not getting better. The patient unfortunately has had this going on for quite some time he is not necessarily the best historian which makes it a little bit difficult but nonetheless I did review notes as well together where things stand what is going on right now has been using Vaseline over the area according to what he tells me he has had debridements with Dr. Luana Shu intermittently. With that being said the patient has not really been using his offloading shoe as much she does have diabetic shoes and he tells me that is primarily what has been wearing. 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 He also has high blood pressure. Electronic Signature(s) Signed: 01/15/2022 8:43:44 AM By: Worthy Keeler PA-C Entered By: Worthy Keeler on 01/15/2022 11:43:44 -------------------------------------------------------------------------------- Physical Exam Details Patient Name: Date of Service: [name], [name] 01/15/2022 8:45 A Terance Hart ([id number]) [id number]  Page 3 of 9 Medical Record Number: [id number] Patient Account Number: [id number] Date of Birth/Sex: Treating RN: 1955-06-19 (66 y.o. [name]) Carlene Coria Primary Care Provider: SYSTEM, PCP Other Clinician: Referring Provider: Treating Provider/Extender: Garry Heater in Treatment: 0 Constitutional sitting or standing blood pressure is within target range for patient.. pulse regular and within target range for patient.Marland Kitchen respirations regular, non-labored and within target range for patient.Marland Kitchen temperature within target range for patient.. Well-nourished and well-hydrated in no acute distress. Eyes conjunctiva clear no eyelid edema noted. pupils equal round and reactive to light and accommodation. Ears, Nose, Mouth, and Throat no gross abnormality of ear auricles or external auditory canals. normal hearing noted during conversation. mucus membranes moist. Respiratory normal breathing without difficulty. clear to auscultation bilaterally. Cardiovascular 2+ dorsalis pedis/posterior tibialis pulses. no clubbing, cyanosis, , <3 sec cap refill. Musculoskeletal normal gait and posture. no significant deformity or arthritic changes, no loss or range of motion, no clubbing. Psychiatric this patient is able to make decisions and demonstrates good insight into disease process. Alert and Oriented x 3. pleasant and cooperative. Notes Upon inspection patient's wound bed did require some sharp debridement  clearway some of the slough and biofilm down to good subcutaneous tissue. He actually tolerated this today without complication and postdebridement the wound bed appears to be doing much better and very pleased with where we stand currently. He does have significant peripheral edema which I would say is at least 2 possible even 3+. He has excellent bounding pulses and seems to have great capillary refill as well which is good news. Electronic Signature(s) Signed: 01/15/2022 8:44:29 AM By: Worthy Keeler PA-C Entered By: Worthy Keeler on 01/15/2022 11:44:28 -------------------------------------------------------------------------------- Physician Orders Details Patient Name: Date of Service: [name], [name] [facility] 01/15/2022 8:45 A M Medical Record Number: [id number] Patient Account Number: [id number] Date of Birth/Sex: Treating RN: 08/16/1955 (65 y.o. [name]) Carlene Coria Primary Care Provider: SYSTEM, PCP Other Clinician: Referring Provider: Treating Provider/Extender: Garry Heater in Treatment: 0 Verbal / Phone Orders: No Diagnosis Coding ICD-10 Coding Code Description E11.621 Type 2 diabetes mellitus with foot ulcer L97.512 Non-pressure chronic ulcer of other part of right foot with fat layer exposed Z89.411 Acquired absence of right great toe N18.30 Chronic kidney disease, stage 3 unspecified I10 Essential (primary) hypertension Follow-up Appointments Return Appointment in 1 week. ALIXANDER, RALLIS ([id number]) [id number] Page 4 of [place] for wound care. May utilize formulary equivalent dressing for wound treatment orders unless otherwise specified. Home Health Nurse may visit PRN to address patients wound care needs. - [facility] [phone] Bathing/ Shower/ Hygiene Clean wound with Normal Saline or wound cleanser. May shower with wound dressing protected with water repellent cover or cast protector. No tub  bath. Anesthetic (Use 'Patient Medications' Section for Anesthetic Order Entry) Lidocaine applied to wound bed Edema Control - Lymphedema / Segmental Compressive Device / Other Elevate, Exercise Daily and A void Standing for Long Periods of Time. Elevate legs to the level of the heart and pump ankles as often as possible Elevate leg(s) parallel to the floor when sitting. Off-Loading Other: - front offloading shoe Wound Treatment Wound #1 - Metatarsal head first Wound Laterality: Right Prim Dressing: Hydrofera Blue Ready Transfer Foam, 2.5x2.5 (in/in) 3 x Per Week ary Discharge Instructions: Apply Hydrofera Blue Ready to wound bed as directed Secondary Dressing: Coverlet Latex-Free Fabric Adhesive Dressings 3 x Per Week Discharge Instructions: 1.5 x 2 Electronic Signature(s) Signed: 01/15/2022 2:02:33 PM By:  Worthy Keeler PA-C Signed: 01/17/2022 8:53:50 AM By: Carlene Coria RN Entered By: Carlene Coria on 01/15/2022 10:04:52 -------------------------------------------------------------------------------- Problem List Details Patient Name: Date of Service: [name], [name] [facility] 01/15/2022 8:45 A M Medical Record Number: [id number] Patient Account Number: [id number] Date of Birth/Sex: Treating RN: [birth year] (65 y.o. [name]) Carlene Coria Primary Care Provider: SYSTEM, PCP Other Clinician: Referring Provider: Treating Provider/Extender: Garry Heater in Treatment: 0 Active Problems ICD-10 Encounter Code Description Active Date MDM Diagnosis E11.621 Type 2 diabetes mellitus with foot ulcer 01/15/2022 No Yes L97.512 Non-pressure chronic ulcer of other part of right foot with fat layer exposed 01/15/2022 No Yes Z89.411 Acquired absence of right great toe 01/15/2022 No Yes N18.30 Chronic kidney disease, stage 3 unspecified 01/15/2022 No Yes COWEN, JEWART ([id number]) [id number] Page 5 of 9 I10 Essential (primary) hypertension 01/15/2022 No  Yes Inactive Problems Resolved Problems Electronic Signature(s) Signed: 01/15/2022 9:13:50 AM By: Worthy Keeler PA-C Entered By: Worthy Keeler on 01/15/2022 09:13:50 -------------------------------------------------------------------------------- Progress Note Details Patient Name: Date of Service: [name], [name] [facility] 01/15/2022 8:45 A M Medical Record Number: [id number] Patient Account Number: [id number] Date of Birth/Sex: Treating RN: 07-15-1955 (65 y.o. [name]) Carlene Coria Primary Care Provider: SYSTEM, PCP Other Clinician: Referring Provider: Treating Provider/Extender: Garry Heater in Treatment: 0 Subjective Chief Complaint Information obtained from Patient Right foot ulcer at amputation site History of Present Illness (HPI) 01-15-2022 patient presents today as a referral from Dr. Caroline More who is a local podiatrist due to a foot wound that is not getting better. The patient unfortunately has had this going on for quite some time he is not necessarily the best historian which makes it a little bit difficult but nonetheless I did review notes as well together where things stand what is going on right now has been using Vaseline over the area according to what he tells me he has had debridements with Dr. Luana Shu intermittently. With that being said the patient has not really been using his offloading shoe as much she does have diabetic shoes and he tells me that is primarily what has been wearing. 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 He also has high blood pressure. Patient History Information obtained from Patient. Allergies empagliflozin Social History Never smoker, Marital Status - Single, Alcohol Use - Never, Drug Use - No History, Caffeine Use - Daily. Medical History Cardiovascular Patient has  history of Hypertension Endocrine Patient has history of Type II Diabetes Patient is treated with Insulin. Review of Systems (ROS) Eyes Complains or has symptoms of Glasses / Contacts. Integumentary (Skin) Complains or has symptoms of Wounds, Swelling. DAMION, JAUCH ([id number]) [id number] Page 6 of 9 Objective Constitutional sitting or standing blood pressure is within target range for patient.. pulse regular and within target range for patient.Marland Kitchen respirations regular, non-labored and within target range for patient.Marland Kitchen temperature within target range for patient.. Well-nourished and well-hydrated in no acute distress. Vitals Time Taken: 8:53 AM, Height: 76 in, Source: Stated, Weight: 267 lbs, Source: Stated, BMI: 32.5, Temperature: 98.2 F, Pulse: 73 bpm, Respiratory Rate: 16 breaths/min, Blood Pressure: 185/77 mmHg. General Notes: patient reports he has not taken BP meds this am, denies S/s of elevated/high blood pressure, notified Jeri Cos PA Eyes conjunctiva clear no eyelid edema noted. pupils equal round and reactive to light and accommodation. Ears, Nose, Mouth,  and Throat no gross abnormality of ear auricles or external auditory canals. normal hearing noted during conversation. mucus membranes moist. Respiratory normal breathing without difficulty. clear to auscultation bilaterally. Cardiovascular 2+ dorsalis pedis/posterior tibialis pulses. no clubbing, cyanosis, , Musculoskeletal normal gait and posture. no significant deformity or arthritic changes, no loss or range of motion, no clubbing. Psychiatric this patient is able to make decisions and demonstrates good insight into disease process. Alert and Oriented x 3. pleasant and cooperative. General Notes: Upon inspection patient's wound bed did require some sharp debridement clearway some of the slough and biofilm down to good subcutaneous tissue. He actually tolerated this today without  complication and postdebridement the wound bed appears to be doing much better and very pleased with where we stand currently. He does have significant peripheral edema which I would say is at least 2 possible even 3+. He has excellent bounding pulses and seems to have great capillary refill as well which is good news. Integumentary (Hair, Skin) Wound #1 status is Open. Original cause of wound was Gradually Appeared. The date acquired was: 04/02/2021. The wound is located on the Right Metatarsal head first. The wound measures 0.9cm length x 0.9cm width x 0.3cm depth; 0.636cm^2 area and 0.191cm^3 volume. There is Fat Layer (Subcutaneous Tissue) exposed. There is no tunneling noted, however, there is undermining starting at 12:00 and ending at 12:00 with a maximum distance of 0.2cm. There is a medium amount of serosanguineous drainage noted. There is large (67-100%) red granulation within the wound bed. There is a small (1-33%) amount of necrotic tissue within the wound bed. 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 There was a Excisional Skin/Subcutaneous Tissue Debridement with a total area of 2.25 sq cm performed by Tommie Sams., PA-C. With the following instrument(s): Curette to remove Viable and Non-Viable tissue/material. Material removed includes Callus, Subcutaneous Tissue, Skin: Dermis, Skin: Epidermis, and Biofilm. No specimens were taken. A time out was conducted at 09:27, prior to the start of the procedure. A Moderate amount of bleeding was controlled with  Silver Nitrate. The procedure was tolerated well with a pain level of 0 throughout and a pain level of 0 following the procedure. Post Debridement Measurements: 0.9cm length x 0.9cm width x 0.3cm depth; 0.191cm^3 volume. Character of Wound/Ulcer Post Debridement is improved. Severity of Tissue Post Debridement is: Fat layer exposed. Post procedure Diagnosis Wound #1: Same as Pre-Procedure Plan Follow-up Appointments: CAMON, BARB ([id number]) [id number] Page 7 of 9 Return Appointment in 1 week. Home Health: [facility] for wound care. May utilize formulary equivalent dressing for wound treatment orders unless otherwise specified. Home Health Nurse may visit PRN to address patientoos wound care needs. - [facility] [phone] Bathing/ Shower/ Hygiene: Clean wound with Normal Saline or wound cleanser. May shower with wound dressing protected with water repellent cover or cast protector. No tub bath. Anesthetic (Use 'Patient Medications' Section for Anesthetic Order Entry): Lidocaine applied to wound bed Edema Control - Lymphedema / Segmental Compressive Device / Other: Elevate, Exercise Daily and Avoid Standing for Long Periods of Time. Elevate legs to the level of the heart and pump ankles  as often as possible Elevate leg(s) parallel to the floor when sitting. Off-Loading: Other: - front offloading shoe WOUND #1: - Metatarsal head first Wound Laterality: Right Prim Dressing: Hydrofera Blue Ready Transfer Foam, 2.5x2.5 (in/in) 3 x Per Week/ ary Discharge Instructions: Apply Hydrofera Blue Ready to wound bed as directed Secondary Dressing: Coverlet Latex-Free Fabric Adhesive Dressings 3 x Per Week/ Discharge Instructions: 1.5 x 2 1. I would recommend based on what we are seeing right now that we actually go ahead and have the patient placed in a front offloading shoe. I do believe this is probably can to be the best way to try to keep pressure off  and see if we can make some improvements here. 2. I am also can recommend that we have him utilize [employer] followed by a [employer] dressing to cover. elfa 3. I would also suggest that we have the patient continue with the recommendation to stay off of his foot is much as he can when he is not having to be up and moving. Obviously the more that he can do the better. Nonetheless if he is not seeing improvement with the current measures then the neck step in my opinion would be to switch to a total contact cast. We will see patient back for reevaluation in 1 week here in the clinic. If anything worsens or changes patient will contact our office for additional recommendations. Electronic Signature(s) Signed: 01/15/2022 11:45:30 AM By: Worthy Keeler PA-C Entered By: Worthy Keeler on 01/15/2022 11:45:30 -------------------------------------------------------------------------------- ROS/PFSH Details Patient Name: Date of Service: [name], [name] [facility] 01/15/2022 8:45 A M Medical Record Number: [id number] Patient Account Number: [id number] Date of Birth/Sex: Treating RN: 01/19/1956 (65 y.o. [name]) Carlene Coria Primary Care Provider: SYSTEM, PCP Other Clinician: Referring Provider: Treating Provider/Extender: Garry Heater in Treatment: 0 Information Obtained From Patient Eyes Complaints and Symptoms: Positive for: Glasses / Contacts Integumentary (Skin) Complaints and Symptoms: Positive for: Wounds; Swelling Cardiovascular Medical History: Positive for: Hypertension BROXTON, SIEVERS ([id number]) [id number] Page 8 of 9 Endocrine Medical History: Positive for: Type II Diabetes Time with diabetes: 5 years Treated with: Insulin Immunizations Pneumococcal Vaccine: Received Pneumococcal Vaccination: Yes Received Pneumococcal Vaccination On or After 60th Birthday: Yes Implantable Devices None Family and Social History Never smoker;  Marital Status - Single; Alcohol Use: Never; Drug Use: No History; Caffeine Use: Daily Electronic Signature(s) Signed: 01/15/2022 2:02:33 PM By: Worthy Keeler PA-C Signed: 01/17/2022 8:53:50 AM By: Carlene Coria RN Entered By: Carlene Coria on 01/15/2022 08:57:48 -------------------------------------------------------------------------------- SuperBill Details Patient Name: Date of Service: [name], [name] [facility] 01/15/2022 Medical Record Number: [id number] Patient Account Number: [id number] Date of Birth/Sex: Treating RN: June 09, 1955 (65 y.o. 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 N18.30 Chronic kidney disease, stage 3 unspecified I10 Essential (primary) hypertension Facility Procedures : CPT4 Code: [id number] Description: 99213 - WOUND CARE VISIT-LEV 3 EST PT Modifier: Quantity: 1 : CPT4 Code: [id number] Description: 11042 - DEB SUBQ TISSUE 20 SQ CM/< ICD-10 Diagnosis Description L97.512 Non-pressure chronic ulcer of other part of right foot with fat layer exposed Modifier: Quantity: 1 Physician Procedures : CPT4 Code Description Modifier [id number] WC PHYS LEVEL 3 NEW PT 25 ICD-10 Diagnosis Description  E11.621 Type 2 diabetes mellitus with foot ulcer REMIJIO, HARVARD ([id number]) [phone] L97.512 Non-pressure chronic ulcer of other part of  right foot with fat layer exposed Z89.411 Acquired absence of right great toe N18.30 Chronic kidney disease, stage 3 unspecified Quantity: 1 [id number] Page 9 of 9 : [id number] 11042 - WC PHYS SUBQ TISS 20 SQ CM ICD-10 Diagnosis Description L97.512 Non-pressure chronic ulcer of other part of  right foot with fat layer exposed Quantity: 1 Electronic Signature(s) Signed: 01/15/2022 8:45:50 AM By: Worthy Keeler PA-C Previous Signature: 01/15/2022 10:23:10 AM Version By: Carlene Coria RN Entered By: Worthy Keeler on 01/15/2022 11:45:50

## 2022-01-17 NOTE — Progress Notes (Signed)
[name], [name] ([id number]) [id number] Page 1 of 9 Visit Report for 01/15/2022 Allergy List Details Patient Name: Date of Service: Robert Levine [facility] 01/15/2022 8:45 A M Medical Record Number: [id number] Patient Account Number: [id number] Date of Birth/Sex: Treating RN: 08-Nov-1955 (65 y.o. Robert Levine) Carlene Coria Primary Care Anea Fodera: SYSTEM, PCP Other Clinician: Referring Jaleen Finch: Treating Pratt Bress/Extender: Garry Heater in Treatment: 0 Allergies Active Allergies empagliflozin Allergy Notes Electronic Signature(s) Signed: 01/17/2022 8:53:50 AM By: Carlene Coria RN Entered By: Carlene Coria on 01/15/2022 08:55:25 -------------------------------------------------------------------------------- Arrival Information Details Patient Name: Date of Service: Robert Levine, [name] 01/15/2022 8:45 A M Medical Record Number: [id number] Patient Account Number: [id number] Date of Birth/Sex: Treating RN: [birth year] (65 y.o. Robert Levine) Carlene Coria Primary Care Zoya Sprecher: SYSTEM, PCP Other Clinician: Referring Bonnita Newby: Treating Naidelin Gugliotta/Extender: Garry Heater in Treatment: 0 Visit Information Patient Arrived: Ambulatory Arrival Time: 08:52 Accompanied By: self Transfer Assistance: None Patient Identification Verified: Yes Secondary Verification Process Completed: Yes Patient Requires Transmission-Based Precautions: No Patient Has Alerts: Yes Patient Alerts: DIABETIC NON COMPRESSABLE Electronic Signature(s) Signed: 01/17/2022 8:53:50 AM By: Carlene Coria RN Scheryl Darter, Iona Beard ([id number]) [id number] Page 2 of 9 Entered By: Carlene Coria on 01/15/2022 09:10:26 -------------------------------------------------------------------------------- Clinic Level of Care Assessment Details Patient Name: Date of Service: Robert Levine [facility] 01/15/2022 8:45 A M Medical Record Number: [id number] Patient Account Number: [id number] Date  of Birth/Sex: Treating RN: 10-11-1955 (65 y.o. Robert Levine) Carlene Coria Primary Care Erum Cercone: SYSTEM, PCP Other Clinician: Referring Arbell Wycoff: Treating Abdul Beirne/Extender: Garry Heater in Treatment: 0 Clinic Level of Care Assessment Items TOOL 1 Quantity Score X- 1 0 Use when EandM and Procedure is performed on INITIAL visit ASSESSMENTS - Nursing Assessment / Reassessment X- 1 20 General Physical Exam (combine w/ comprehensive assessment (listed just below) when performed on new pt. evals) X- 1 25 Comprehensive Assessment (HX, ROS, Risk Assessments, Wounds Hx, etc.) ASSESSMENTS - Wound and Skin Assessment / Reassessment []  - 0 Dermatologic / Skin Assessment (not related to wound area) ASSESSMENTS - Ostomy and/or Continence Assessment and Care []  - 0 Incontinence Assessment and Management []  - 0 Ostomy Care Assessment and Management (repouching, etc.) PROCESS - Coordination of Care X - Simple Patient / Family Education for ongoing care 1 15 []  - 0 Complex (extensive) Patient / Family Education for ongoing care []  - 0 Staff obtains Programmer, systems, Records, T Results / Process Orders est []  - 0 Staff telephones HHA, Nursing Homes / Clarify orders / etc []  - 0 Routine Transfer to another Facility (non-emergent condition) []  - 0 Routine Hospital Admission (non-emergent condition) X- 1 15 New Admissions / Biomedical engineer / Ordering NPWT Apligraf, etc. , []  - 0 Emergency Hospital Admission (emergent condition) PROCESS - Special Needs []  - 0 Pediatric / Minor Patient Management []  - 0 Isolation Patient Management []  - 0 Hearing / Language / Visual special needs []  - 0 Assessment of Community assistance (transportation, D/C planning, etc.) []  - 0 Additional assistance / Altered mentation []  - 0 Support Surface(s) Assessment (bed, cushion, seat, etc.) INTERVENTIONS - Miscellaneous []  - 0 External ear exam []  - 0 Patient Transfer (multiple staff / Librarian, academic / Similar devices) []  - 0 Simple Staple / Suture removal (25 or less) []  - 0 Complex Staple / Suture removal (26 or more) JARVIS, YOCHUM ([id number]) [id number] Page 3 of 9 []  - 0 Hypo/Hyperglycemic Management (do not check if billed separately) X- 1 15 Ankle / Brachial Index (ABI) - do not check if billed  separately Has the patient been seen at the hospital within the last three years: Yes Total Score: 90 Level Of Care: New/Established - Level 3 Electronic Signature(s) Signed: 01/17/2022 8:53:50 AM By: Carlene Coria RN Entered By: Carlene Coria on 01/15/2022 09:37:08 -------------------------------------------------------------------------------- Encounter Discharge Information Details Patient Name: Date of Service: Robert Levine, [name] 01/15/2022 8:45 A M Medical Record Number: [id number] Patient Account Number: [id number] Date of Birth/Sex: Treating RN: 05-15-1955 (65 y.o. Robert Levine) Carlene Coria Primary Care Angelli Baruch: SYSTEM, PCP Other Clinician: Referring Mariaeduarda Defranco: Treating Vivan Vanderveer/Extender: Garry Heater in Treatment: 0 Encounter Discharge Information Items Post Procedure Vitals Discharge Condition: Stable Temperature (F): 98.2 Ambulatory Status: Ambulatory Pulse (bpm): 73 Discharge Destination: Home Respiratory Rate (breaths/min): 16 Transportation: Private Auto Blood Pressure (mmHg): 185/77 Accompanied By: self Schedule Follow-up Appointment: Yes Clinical Summary of Care: Electronic Signature(s) Signed: 01/17/2022 8:53:50 AM By: Carlene Coria RN Entered By: Carlene Coria on 01/15/2022 09:39:36 -------------------------------------------------------------------------------- Lower Extremity Assessment Details Patient Name: Date of Service: Robert Levine [facility] 01/15/2022 8:45 A M Medical Record Number: [id number] Patient Account Number: [id number] Date of Birth/Sex: Treating RN: 1955-08-22 (65 y.o. Robert Levine Primary Care  Danyia Borunda: SYSTEM, PCP Other Clinician: Referring Darleny Sem: Treating Avelyn Touch/Extender: Garry Heater in Treatment: 0 Edema Assessment Assessed: [Left: No] [Right: No] Edema: [Left: Ye] [Right: s] B[LeftSAMMUEL, KICHLINE [id number] [Right: [id number] Page 4 of 9] Calf Left: Right: Point of Measurement: 42 cm From Medial Instep 41 cm Ankle Left: Right: Point of Measurement: 10 cm From Medial Instep 31 cm Knee To Floor Left: Right: From Medial Instep 54 cm Vascular Assessment Pulses: Dorsalis Pedis Palpable: [Right:Yes Yes] Notes non compressable Electronic Signature(s) Signed: 01/17/2022 8:53:50 AM By: Carlene Coria RN Entered By: Carlene Coria on 01/15/2022 09:09:58 -------------------------------------------------------------------------------- Multi Wound Chart Details Patient Name: Date of Service: Robert Levine, [name] 01/15/2022 8:45 A M Medical Record Number: [id number] Patient Account Number: [id number] Date of Birth/Sex: Treating RN: March 20, 1956 (65 y.o. Robert Levine) Carlene Coria Primary Care Adonys Wildes: SYSTEM, PCP Other Clinician: Referring Willine Schwalbe: Treating Curvin Hunger/Extender: Garry Heater in Treatment: 0 Vital Signs Height(in): 76 Pulse(bpm): 73 Weight(lbs): 267 Blood Pressure(mmHg): 185/77 Body Mass Index(BMI): 32.5 Temperature(F): 98.2 Respiratory Rate(breaths/min): 16 [1:Photos:] [N/A:N/A] Right Metatarsal head first N/A N/A Wound Location: Gradually Appeared N/A N/A Wounding Event: Diabetic Wound/Ulcer of the Lower N/A N/A Primary Etiology: Extremity Hypertension, Type II Diabetes N/A N/A Comorbid HistoryGERELL, DIMITRI ([id number]) [id number] Page 5 of 9 04/02/2021 N/A N/A Date Acquired: 0 N/A N/A Weeks of Treatment: Open N/A N/A Wound Status: No N/A N/A Wound Recurrence: 0.7x0.8x0.4 N/A N/A Measurements L x W x D (cm) 0.44 N/A N/A A (cm) : rea 0.176 N/A  N/A Volume (cm) : 12 Starting Position 1 (o'clock): 12 Ending Position 1 (o'clock): 0.2 Maximum Distance 1 (cm): Yes N/A N/A Undermining: Grade 1 N/A N/A Classification: Medium N/A N/A Exudate A mount: Serosanguineous N/A N/A Exudate Type: red, brown N/A N/A Exudate Color: Large (67-100%) N/A N/A Granulation A mount: Red N/A N/A Granulation Quality: Small (1-33%) N/A N/A Necrotic A mount: Fat Layer (Subcutaneous Tissue): Yes N/A N/A Exposed Structures: Fascia: No Tendon: No Muscle: No Joint: No Bone: No None N/A N/A Epithelialization: Treatment Notes Electronic Signature(s) Signed: 01/17/2022 8:53:50 AM By: Carlene Coria RN Entered By: Carlene Coria on 01/15/2022 09:32:00 -------------------------------------------------------------------------------- Multi-Disciplinary Care Plan Details Patient Name: Date of Service: Robert Levine, [name] [facility] 01/15/2022 8:45 A M Medical Record Number: [id number] Patient Account Number: [id number] Date of Birth/Sex: Treating RN: 09-05-1955 (65  y.o. Robert Levine Primary Care Nayan Proch: SYSTEM, PCP Other Clinician: Referring Fredia Chittenden: Treating Alucard Fearnow/Extender: Garry Heater in Treatment: 0 Active Inactive Abuse / Safety / Falls / Self Care Management Nursing Diagnoses: Potential for injury related to falls Goals: Patient will remain injury free related to falls Date Initiated: 01/15/2022 Target Resolution Date: 02/15/2022 Goal Status: Active Interventions: Assess Activities of Daily Living upon admission and as needed Assess fall risk on admission and as needed Assess: immobility, friction, shearing, incontinence upon admission and as needed Assess impairment of mobility on admission and as needed per policy Assess personal safety and home safety (as indicated) on admission and as needed Assess self care needs on admission and as needed OLUWATOMISIN, DEMAN ([id number]) [id number] Page 6  of 9 Notes: Nutrition Nursing Diagnoses: Impaired glucose control: actual or potential Goals: Patient/caregiver will maintain therapeutic glucose control Date Initiated: 01/15/2022 Target Resolution Date: 03/17/2022 Goal Status: Active Interventions: Assess HgA1c results as ordered upon admission and as needed Assess patient nutrition upon admission and as needed per policy Notes: Wound/Skin Impairment Nursing Diagnoses: Knowledge deficit related to ulceration/compromised skin integrity Goals: Patient/caregiver will verbalize understanding of skin care regimen Date Initiated: 01/15/2022 Target Resolution Date: 02/15/2022 Goal Status: Active Ulcer/skin breakdown will have a volume reduction of 30% by week 4 Date Initiated: 01/15/2022 Target Resolution Date: 03/17/2022 Goal Status: Active Ulcer/skin breakdown will have a volume reduction of 50% by week 8 Date Initiated: 01/15/2022 Target Resolution Date: 04/17/2022 Goal Status: Active Ulcer/skin breakdown will have a volume reduction of 80% by week 12 Date Initiated: 01/15/2022 Target Resolution Date: 05/18/2022 Goal Status: Active Ulcer/skin breakdown will heal within 14 weeks Date Initiated: 01/15/2022 Target Resolution Date: 06/16/2022 Goal Status: Active Interventions: Assess patient/caregiver ability to obtain necessary supplies Assess patient/caregiver ability to perform ulcer/skin care regimen upon admission and as needed Assess ulceration(s) every visit Notes: Electronic Signature(s) Signed: 01/17/2022 8:53:50 AM By: Carlene Coria RN Entered By: Carlene Coria on 01/15/2022 09:31:43 -------------------------------------------------------------------------------- Pain Assessment Details Patient Name: Date of Service: Robert Levine [facility] 01/15/2022 8:45 A M Medical Record Number: [id number] Patient Account Number: [id number] Date of Birth/Sex: Treating RN: 09-08-1955 (65 y.o. Robert Levine) Carlene Coria Primary Care Daimon Kean:  SYSTEM, PCP Other Clinician: Referring Kani Jobson: Treating Keziah Avis/Extender: Garry Heater in Treatment: 0 Active Problems Vicenta Dunning ([id number]) [id number] Page 7 of 9 Location of Pain Severity and Description of Pain Patient Has Paino No Site Locations Pain Management and Medication Current Pain Management: Electronic Signature(s) Signed: 01/17/2022 8:53:50 AM By: Carlene Coria RN Entered By: Carlene Coria on 01/15/2022 08:53:40 -------------------------------------------------------------------------------- Patient/Caregiver Education Details Patient Name: Date of Service: Robert Levine, [name] 10/16/2023andnbsp8:45 [place] Record Number: [id number] Patient Account Number: [id number] Date of Birth/Gender: Treating RN: 1955/05/13 (65 y.o. Robert Levine Primary Care Physician: SYSTEM, PCP Other Clinician: Referring Physician: Treating Physician/Extender: Garry Heater in Treatment: 0 Education Assessment Education Provided To: Patient Education Topics Provided Wound/Skin Impairment: Methods: Explain/Verbal Responses: State content correctly Electronic Signature(s) Signed: 01/17/2022 8:53:50 AM By: Carlene Coria RN Entered By: Carlene Coria on 01/15/2022 09:37:42 Vicenta Dunning ([id number]) [id number] Page 8 of 9 -------------------------------------------------------------------------------- Wound Assessment Details Patient Name: Date of Service: Robert Levine [facility] 01/15/2022 8:45 A M Medical Record Number: [id number] Patient Account Number: [id number] Date of Birth/Sex: Treating RN: 1955/10/19 (65 y.o. Robert Levine) Carlene Coria Primary Care Rorey Bisson: SYSTEM, PCP Other Clinician: Referring Kaimen Peine: Treating Imaya Duffy/Extender: Garry Heater in Treatment: 0 Wound Status Wound Number: 1 Primary Etiology: Diabetic  Wound/Ulcer of the Lower Extremity Wound Location: Right  Metatarsal head first Wound Status: Open Wounding Event: Gradually Appeared Comorbid History: Hypertension, Type II Diabetes Date Acquired: 04/02/2021 Weeks Of Treatment: 0 Clustered Wound: No Photos Wound Measurements Length: (cm) 0.9 Width: (cm) 0.9 Depth: (cm) 0.3 Area: (cm) 0.6 Volume: (cm) 0.1 % Reduction in Area: -44.5% % Reduction in Volume: -8.5% Epithelialization: None 36 Tunneling: No 91 Undermining: Yes Starting Position (o'clock): 12 Ending Position (o'clock): 12 Maximum Distance: (cm) 0.2 Wound Description Classification: Grade 1 Exudate Amount: Medium Exudate Type: Serosanguineous Exudate Color: red, brown Foul Odor After Cleansing: No Slough/Fibrino Yes Wound Bed Granulation Amount: Large (67-100%) Exposed Structure Granulation Quality: Red Fascia Exposed: No Necrotic Amount: Small (1-33%) Fat Layer (Subcutaneous Tissue) Exposed: Yes Tendon Exposed: No Muscle Exposed: No Joint Exposed: No Bone Exposed: No Treatment Notes Wound #1 (Metatarsal head first) Wound Laterality: Right Cleanser URYAH, CLENDANIEL ([id number]) [id number] Page 9 of 9 Peri-Wound Care Topical Primary Dressing Hydrofera Blue Ready Transfer Foam, 2.5x2.5 (in/in) Discharge Instruction: Apply Hydrofera Blue Ready to wound bed as directed Secondary Dressing Coverlet Latex-Free Fabric Adhesive Dressings Discharge Instruction: 1.5 x 2 Secured With Compression Wrap Compression Stockings Add-Ons Electronic Signature(s) Signed: 01/17/2022 8:53:50 AM By: Carlene Coria RN Entered By: Carlene Coria on 01/15/2022 09:38:40 -------------------------------------------------------------------------------- Vitals Details Patient Name: Date of Service: Robert Levine, [name] 01/15/2022 8:45 A M Medical Record Number: [id number] Patient Account Number: [id number] Date of Birth/Sex: Treating RN: 1955/10/12 (65 y.o. Robert Levine) Carlene Coria Primary Care Clay Menser: SYSTEM, PCP Other  Clinician: Referring Erisa Mehlman: Treating Atalaya Zappia/Extender: Garry Heater in Treatment: 0 Vital Signs Time Taken: 08:53 Temperature (F): 98.2 Height (in): 76 Pulse (bpm): 73 Source: Stated Respiratory Rate (breaths/min): 16 Weight (lbs): 267 Blood Pressure (mmHg): 185/77 Source: Stated Reference Range: 80 - 120 mg / dl Body Mass Index (BMI): 32.5 Notes patient reports he has not taken BP meds this am, denies S/s of elevated/high blood pressure, notified Jeri Cos PA Electronic Signature(s) Signed: 01/17/2022 8:53:50 AM By: Carlene Coria RN Entered By: Carlene Coria on 01/15/2022 08:54:55

## 2022-01-17 NOTE — Progress Notes (Signed)
Robert, Levine ([id number]) [id number] Page 1 of 5 Visit Report for 01/15/2022 Abuse Risk Screen Details Patient Name: Date of Service: Robert Levine [facility] 01/15/2022 8:45 A M Medical Record Number: [id number] Patient Account Number: [id number] Date of Birth/Sex: Treating RN: [birth year] (65 y.o. Robert Levine) Carlene Coria Primary Care Arika Mainer: SYSTEM, PCP Other Clinician: Referring Jehu Mccauslin: Treating Omaya Nieland/Extender: Garry Heater in Treatment: 0 Abuse Risk Screen Items Answer ABUSE RISK SCREEN: Has anyone close to you tried to hurt or harm you recentlyo No Do you feel uncomfortable with anyone in your familyo No Has anyone forced you do things that you didnt want to doo No Electronic Signature(s) Signed: 01/17/2022 8:53:50 AM By: Carlene Coria RN Entered By: Carlene Coria on 01/15/2022 08:57:54 -------------------------------------------------------------------------------- Activities of Daily Living Details Patient Name: Date of Service: Robert Levine [facility] 01/15/2022 8:45 A M Medical Record Number: [id number] Patient Account Number: [id number] Date of Birth/Sex: Treating RN: [birth year] (65 y.o. Robert Levine) Carlene Coria Primary Care Bobbyjoe Pabst: SYSTEM, PCP Other Clinician: Referring Olumide Dolinger: Treating Keana Dueitt/Extender: Garry Heater in Treatment: 0 Activities of Daily Living Items Answer Activities of Daily Living (Please select one for each item) Drive Automobile Completely Able T Medications ake Need Assistance Use T elephone Completely Able Care for Appearance Completely Able Use T oilet Completely Able Bath / Shower Completely Able Dress Self Completely Able Feed Self Completely Able Walk Completely Able Get In / Out Bed Completely Able Housework Completely Able ZAREK, RELPH ([id number]) [id number] Page 2 of 5 Prepare Meals Completely Able Handle Money Completely Able Shop for  Self Completely Able Electronic Signature(s) Signed: 01/17/2022 8:53:50 AM By: Carlene Coria RN Entered By: Carlene Coria on 01/15/2022 08:58:20 -------------------------------------------------------------------------------- Education Screening Details Patient Name: Date of Service: Robert Levine, Robert Levine 01/15/2022 8:45 A M Medical Record Number: [id number] Patient Account Number: [id number] Date of Birth/Sex: Treating RN: 1956-03-19 (65 y.o. Robert Levine) Carlene Coria Primary Care Joley Utecht: SYSTEM, PCP Other Clinician: Referring Zacharey Jensen: Treating Fidelia Cathers/Extender: Garry Heater in Treatment: 0 Primary Learner Assessed: Patient Learning Preferences/Education Level/Primary Language Learning Preference: Explanation Highest Education Level: College or Above Preferred Language: English Cognitive Barrier Language Barrier: No Translator Needed: No Memory Deficit: No Emotional Barrier: No Physical Barrier Impaired Vision: Yes Glasses Impaired Hearing: No Decreased Hand dexterity: No Knowledge/Comprehension Knowledge Level: Medium Comprehension Level: Medium Ability to understand written instructions: High Ability to understand verbal instructions: High Motivation Anxiety Level: Anxious Cooperation: Cooperative Education Importance: Acknowledges Need Interest in Health Problems: Asks Questions Perception: Coherent Willingness to Engage in Self-Management High Activities: Readiness to Engage in Self-Management High Activities: Electronic Signature(s) Signed: 01/17/2022 8:53:50 AM By: Carlene Coria RN Entered By: Carlene Coria on 01/15/2022 08:58:46 Robert Levine ([id number]) [phone] [id number] Page 3 of 5 -------------------------------------------------------------------------------- Fall Risk Assessment Details Patient Name: Date of Service: Robert Levine [facility] 01/15/2022 8:45 A M Medical Record Number: [id number] Patient Account Number:  [id number] Date of Birth/Sex: Treating RN: Sep 01, 1955 (65 y.o. Robert Levine) Carlene Coria Primary Care Chrystina Naff: SYSTEM, PCP Other Clinician: Referring Nazariah Cadet: Treating Mavric Cortright/Extender: Garry Heater in Treatment: 0 Fall Risk Assessment Items Have you had 2 or more falls in the last 12 monthso 0 No Have you had any fall that resulted in injury in the last 12 monthso 0 No FALLS RISK SCREEN History of falling - immediate or within 3 months 0 No Secondary diagnosis (Do you have 2 or more medical diagnoseso) 0 No Ambulatory aid None/bed rest/wheelchair/nurse 0 No Crutches/cane/walker 0 No Furniture 0 No  Intravenous therapy Access/Saline/Heparin Lock 0 No Gait/Transferring Normal/ bed rest/ wheelchair 0 No Weak (short steps with or without shuffle, stooped but able to lift head while walking, may seek 0 No support from furniture) Impaired (short steps with shuffle, may have difficulty arising from chair, head down, impaired 0 No balance) Mental Status Oriented to own ability 0 No Electronic Signature(s) Signed: 01/17/2022 8:53:50 AM By: Carlene Coria RN Entered By: Carlene Coria on 01/15/2022 08:58:51 -------------------------------------------------------------------------------- Foot Assessment Details Patient Name: Date of Service: Robert Levine, Robert Levine 01/15/2022 8:45 A M Medical Record Number: [id number] Patient Account Number: [id number] Date of Birth/Sex: Treating RN: Apr 27, 1955 (65 y.o. Robert Levine) Carlene Coria Primary Care Ramces Shomaker: SYSTEM, PCP Other Clinician: Referring Anani Gu: Treating Rudy Luhmann/Extender: Garry Heater in Treatment: 0 Foot Assessment Items Site Locations Robert Levine ([id number]) [id number] Page 4 of 5 + = Sensation present, - = Sensation absent, C = Callus, U = Ulcer R = Redness, W = Warmth, M = Maceration, PU = Pre-ulcerative lesion F = Fissure, S = Swelling, D = Dryness Assessment Right:  Left: Other Deformity: No No Prior Foot Ulcer: No No Prior Amputation: Yes No Charcot Joint: No No Ambulatory Status: Ambulatory Without Help Gait: Steady Electronic Signature(s) Signed: 01/17/2022 8:53:50 AM By: Carlene Coria RN Entered By: Carlene Coria on 01/15/2022 09:07:01 -------------------------------------------------------------------------------- Nutrition Risk Screening Details Patient Name: Date of Service: Robert Levine [facility] 01/15/2022 8:45 A M Medical Record Number: [id number] Patient Account Number: [id number] Date of Birth/Sex: Treating RN: [birth year] (65 y.o. Robert Levine) Carlene Coria Primary Care Adyline Huberty: SYSTEM, PCP Other Clinician: Referring Langford Carias: Treating Shatoria Stooksbury/Extender: Garry Heater in Treatment: 0 Height (in): 76 Weight (lbs): 267 Body Mass Index (BMI): 32.5 Nutrition Risk Screening Items Score Screening NUTRITION RISK SCREEN: I have an illness or condition that made me change the kind and/or amount of food I eat 2 Yes I eat fewer than two meals per day 0 No I eat few fruits and vegetables, or milk products 0 No I have three or more drinks of beer, liquor or wine almost every day 0 No I have tooth or mouth problems that make it hard for me to eat 0 No I don't always have enough money to buy the food I need 0 No TILL, LOUPE ([id number]) [phone] [id number] Page 5 of 5 I eat alone most of the time 0 No I take three or more different prescribed or over-the-counter drugs a day 1 Yes Without wanting to, I have lost or gained 10 pounds in the last six months 0 No I am not always physically able to shop, cook and/or feed myself 0 No Nutrition Protocols Good Risk Protocol Moderate Risk Protocol 0 Provide education on nutrition High Risk Proctocol Risk Level: Moderate Risk Score: 3 Electronic Signature(s) Signed: 01/17/2022 8:53:50 AM By: Carlene Coria RN Entered By: Carlene Coria on 01/15/2022 08:59:11

## 2022-01-22 ENCOUNTER — Ambulatory Visit: Payer: Medicare Other | Admitting: Physician Assistant

## 2022-01-25 ENCOUNTER — Encounter: Payer: Medicare Other | Admitting: Physician Assistant

## 2022-01-25 DIAGNOSIS — E11621 Type 2 diabetes mellitus with foot ulcer: Secondary | ICD-10-CM | POA: Diagnosis not present

## 2022-01-25 NOTE — Progress Notes (Addendum)
LONNEL, GJERDE ([id number]) [id number] Page 1 of 9 Visit Report for 01/25/2022 Arrival Information Details Patient Name: Date of Service: Robert Levine [facility] 01/25/2022 3:00 PM Medical Record Number: [id number] Patient Account Number: [id number] Date of Birth/Sex: Treating RN: 1955-11-14 (66 y.o. [name] Primary Care Sol Englert: SYSTEM, PCP Other Clinician: Massie Kluver Referring Panayiotis Rain[place]: Treating Brittanyann Wittner/Extender: Garry Heater in Treatment: 1 Visit Information History Since Last Visit All ordered tests and consults were completed: No Patient Arrived: Ambulatory Added or deleted any medications: No Arrival Time: 15:07 Any new allergies or adverse reactions: No Transfer Assistance: None Had a fall or experienced change in No Patient Requires Transmission-Based Precautions: No activities of daily living that may affect Patient Has Alerts: Yes risk of falls: Patient Alerts: DIABETIC Hospitalized since last visit: No NON COMPRESSABLE Pain Present Now: No Electronic Signature(s) Signed: 01/29/2022 5:32:10 PM By: Massie Kluver Entered By: Massie Kluver on 01/25/2022 15:11:47 -------------------------------------------------------------------------------- Clinic Level of Care Assessment Details Patient Name: Date of Service: Robert Levine [facility] 01/25/2022 3:00 PM Medical Record Number: [id number] Patient Account Number: [id number] Date of Birth/Sex: Treating RN: 1956-03-12 (66 y.o. [name] Primary Care Gwynneth Fabio: SYSTEM, PCP Other Clinician: Massie Kluver Referring Colden Samaras: Treating Hoke Baer/Extender: Garry Heater in Treatment: 1 Clinic Level of Care Assessment Items TOOL 1 Quantity Score []  - 0 Use when EandM and Procedure is performed on INITIAL visit ASSESSMENTS - Nursing Assessment / Reassessment []  - 0 General Physical Exam (combine w/ comprehensive assessment (listed just below) when  performed on new pt. evals) []  - 0 Comprehensive Assessment (HX, ROS, Risk Assessments, Wounds Hx, etc.) ASSESSMENTS - Wound and Skin Assessment / Reassessment []  - 0 Dermatologic / Skin Assessment (not related to wound area) ASSESSMENTS - Ostomy and/or Continence Assessment and Care Robert Levine, Robert Levine ([id number]) [id number] Page 2 of 9 []  - 0 Incontinence Assessment and Management []  - 0 Ostomy Care Assessment and Management (repouching, etc.) PROCESS - Coordination of Care []  - 0 Simple Patient / Family Education for ongoing care []  - 0 Complex (extensive) Patient / Family Education for ongoing care []  - 0 Staff obtains Programmer, systems, Records, T Results / Process Orders est []  - 0 Staff telephones HHA, Nursing Homes / Clarify orders / etc []  - 0 Routine Transfer to another Facility (non-emergent condition) []  - 0 Routine Hospital Admission (non-emergent condition) []  - 0 New Admissions / Biomedical engineer / Ordering NPWT Apligraf, etc. , []  - 0 Emergency Hospital Admission (emergent condition) PROCESS - Special Needs []  - 0 Pediatric / Minor Patient Management []  - 0 Isolation Patient Management []  - 0 Hearing / Language / Visual special needs []  - 0 Assessment of Community assistance (transportation, D/C planning, etc.) []  - 0 Additional assistance / Altered mentation []  - 0 Support Surface(s) Assessment (bed, cushion, seat, etc.) INTERVENTIONS - Miscellaneous []  - 0 External ear exam []  - 0 Patient Transfer (multiple staff / Civil Service fast streamer / Similar devices) []  - 0 Simple Staple / Suture removal (25 or less) []  - 0 Complex Staple / Suture removal (26 or more) []  - 0 Hypo/Hyperglycemic Management (do not check if billed separately) []  - 0 Ankle / Brachial Index (ABI) - do not check if billed separately Has the patient been seen at the hospital within the last three years: Yes Total Score: 0 Level Of Care: ____ Electronic  Signature(s) Signed: 01/29/2022 5:32:10 PM By: Massie Kluver Entered By: Massie Kluver on 01/25/2022 15:30:42 -------------------------------------------------------------------------------- Encounter Discharge Information Details Patient Name: Date  of Service: Robert Levine [facility] 01/25/2022 3:00 PM Medical Record Number: [id number] Patient Account Number: [id number] Date of Birth/Sex: Treating RN: 24-Jun-1955 (66 y.o. [name] Primary Care Adoni Greenough: SYSTEM, PCP Other Clinician: Massie Kluver Referring Calum Cormier: Treating Zakarie Sturdivant/Extender: Garry Heater in Treatment: 1 Encounter Discharge Information Items Post Procedure Vitals Discharge Condition: Stable Temperature (F): 98.1 Robert Levine, Robert Levine ([id number]) [id number] Page 3 of 9 Ambulatory Status: Ambulatory Pulse (bpm): 68 Discharge Destination: Home Respiratory Rate (breaths/min): 18 Transportation: Other Blood Pressure (mmHg): 160/83 Accompanied By: self Schedule Follow-up Appointment: Yes Clinical Summary of Care: Electronic Signature(s) Signed: 01/29/2022 5:32:10 PM By: Massie Kluver Entered By: Massie Kluver on 01/25/2022 15:38:29 -------------------------------------------------------------------------------- Lower Extremity Assessment Details Patient Name: Date of Service: Robert Levine [facility] 01/25/2022 3:00 PM Medical Record Number: [id number] Patient Account Number: [id number] Date of Birth/Sex: Treating RN: 10/02/1955 (66 y.o. [name] Primary Care Lasya Vetter: SYSTEM, PCP Other Clinician: Massie Kluver Referring Ajene Carchi: Treating Ozell Juhasz/Extender: Garry Heater in Treatment: 1 Edema Assessment Assessed: [Left: No] [Right: Yes] Edema: [Left: Ye] [Right: s] Calf Left: Right: Point of Measurement: 42 cm From Medial Instep 41.8 cm Ankle Left: Right: Point of Measurement: 10 cm From Medial Instep 31 cm Vascular  Assessment Pulses: Dorsalis Pedis Palpable: [Right:Yes] Posterior Tibial Palpable: [Right:Yes] Electronic Signature(s) Signed: 01/25/2022 4:06:10 PM By: Gretta Cool, [name], RN, CWS, [name], [name] Signed: 01/29/2022 5:32:10 PM By: Massie Kluver Entered By: Massie Kluver on 01/25/2022 15:20:42 Robert Levine ([id number]) [id number] Page 4 of 9 -------------------------------------------------------------------------------- Multi Wound Chart Details Patient Name: Date of Service: Robert Levine [facility] 01/25/2022 3:00 PM Medical Record Number: [id number] Patient Account Number: [id number] Date of Birth/Sex: Treating RN: February 05, 1956 (66 y.o. [name] Primary Care Shalece Staffa: SYSTEM, PCP Other Clinician: Massie Kluver Referring Jodey Burbano: Treating Merlie Noga/Extender: Garry Heater in Treatment: 1 Vital Signs Height(in): 76 Pulse(bpm): 68 Weight(lbs): 267 Blood Pressure(mmHg): 160/83 Body Mass Index(BMI): 32.5 Temperature(F): 98.1 Respiratory Rate(breaths/min): 18 [1:Photos:] [N/A:N/A] Right Metatarsal head first N/A N/A Wound Location: Gradually Appeared N/A N/A Wounding Event: Diabetic Wound/Ulcer of the Lower N/A N/A Primary Etiology: Extremity Hypertension, Type II Diabetes N/A N/A Comorbid History: 04/02/2021 N/A N/A Date Acquired: 1 N/A N/A Weeks of Treatment: Open N/A N/A Wound Status: No N/A N/A Wound Recurrence: 0.9x0.7x0.2 N/A N/A Measurements L x W x D (cm) 0.495 N/A N/A A (cm) : rea 0.099 N/A N/A Volume (cm) : 22.20% N/A N/A % Reduction in A rea: 48.20% N/A N/A % Reduction in Volume: Grade 1 N/A N/A Classification: Medium N/A N/A Exudate A mount: Serosanguineous N/A N/A Exudate Type: red, brown N/A N/A Exudate Color: Large (67-100%) N/A N/A Granulation A mount: Red N/A N/A Granulation Quality: Small (1-33%) N/A N/A Necrotic A mount: Fat Layer (Subcutaneous Tissue): Yes N/A N/A Exposed  Structures: Fascia: No Tendon: No Muscle: No Joint: No Bone: No None N/A N/A Epithelialization: Treatment Notes Electronic Signature(s) Signed: 01/29/2022 5:32:10 PM By: Massie Kluver Entered By: Massie Kluver on 01/25/2022 15:21:30 Robert Levine ([id number]) [id number] Page 5 of 9 -------------------------------------------------------------------------------- Multi-Disciplinary Care Plan Details Patient Name: Date of Service: Robert Levine [facility] 01/25/2022 3:00 PM Medical Record Number: [id number] Patient Account Number: [id number] Date of Birth/Sex: Treating RN: 1955/12/15 (66 y.o. [name] Primary Care Johnisha Louks: SYSTEM, PCP Other Clinician: Massie Kluver Referring Terry Abila: Treating Nolene Rocks/Extender: Garry Heater in Treatment: 1 Active Inactive Abuse / Safety / Falls / Self Care Management Nursing Diagnoses: Potential for injury related to falls Goals: Patient will remain  injury free related to falls Date Initiated: 01/15/2022 Target Resolution Date: 02/15/2022 Goal Status: Active Interventions: Assess Activities of Daily Living upon admission and as needed Assess fall risk on admission and as needed Assess: immobility, friction, shearing, incontinence upon admission and as needed Assess impairment of mobility on admission and as needed per policy Assess personal safety and home safety (as indicated) on admission and as needed Assess self care needs on admission and as needed Notes: Nutrition Nursing Diagnoses: Impaired glucose control: actual or potential Goals: Patient/caregiver will maintain therapeutic glucose control Date Initiated: 01/15/2022 Target Resolution Date: 03/17/2022 Goal Status: Active Interventions: Assess HgA1c results as ordered upon admission and as needed Assess patient nutrition upon admission and as needed per policy Notes: Wound/Skin Impairment Nursing Diagnoses: Knowledge  deficit related to ulceration/compromised skin integrity Goals: Patient/caregiver will verbalize understanding of skin care regimen Date Initiated: 01/15/2022 Target Resolution Date: 02/15/2022 Goal Status: Active Ulcer/skin breakdown will have a volume reduction of 30% by week 4 Date Initiated: 01/15/2022 Target Resolution Date: 03/17/2022 Goal Status: Active Ulcer/skin breakdown will have a volume reduction of 50% by week 8 Date Initiated: 01/15/2022 Target Resolution Date: 04/17/2022 Goal Status: Active Ulcer/skin breakdown will have a volume reduction of 80% by week 12 Robert Levine, Robert Levine ([id number]) [id number] Page 6 of 9 Date Initiated: 01/15/2022 Target Resolution Date: 05/18/2022 Goal Status: Active Ulcer/skin breakdown will heal within 14 weeks Date Initiated: 01/15/2022 Target Resolution Date: 06/16/2022 Goal Status: Active Interventions: Assess patient/caregiver ability to obtain necessary supplies Assess patient/caregiver ability to perform ulcer/skin care regimen upon admission and as needed Assess ulceration(s) every visit Notes: Electronic Signature(s) Signed: 01/25/2022 4:06:10 PM By: Gretta Cool, [name], RN, CWS, [name], [name] Signed: 01/29/2022 5:32:10 PM By: Massie Kluver Entered By: Massie Kluver on 01/25/2022 15:20:48 -------------------------------------------------------------------------------- Pain Assessment Details Patient Name: Date of Service: Robert Levine, Robert Levine 01/25/2022 3:00 PM Medical Record Number: [id number] Patient Account Number: [id number] Date of Birth/Sex: Treating RN: 03-May-1955 (66 y.o. [name] Primary Care Jaylyne Breese: SYSTEM, PCP Other Clinician: Massie Kluver Referring Jiyaan Steinhauser: Treating Clover Feehan/Extender: Garry Heater in Treatment: 1 Active Problems Location of Pain Severity and Description of Pain Patient Has Paino No Site Locations Pain Management and Medication Current Pain  Management: Electronic Signature(s) Signed: 01/25/2022 4:06:10 PM By: Gretta Cool, [name], RN, CWS, [name], [name] Signed: 01/29/2022 5:32:10 PM By: Massie Kluver Entered By: Massie Kluver on 01/25/2022 15:13:53 Robert Levine ([id number]) [id number] Page 7 of 9 -------------------------------------------------------------------------------- Patient/Caregiver Education Details Patient Name: Date of Service: Robert Levine [facility] 10/26/2023andnbsp3:00 PM Medical Record Number: [id number] Patient Account Number: [id number] Date of Birth/Gender: Treating RN: 1955/07/14 (66 y.o. [name] Primary Care Physician: SYSTEM, PCP Other Clinician: Massie Kluver Referring Physician: Treating Physician/Extender: Garry Heater in Treatment: 1 Education Assessment Education Provided To: Patient Education Topics Provided Wound/Skin Impairment: Handouts: Other: continue wound care as directed Methods: Explain/Verbal Responses: State content correctly Electronic Signature(s) Signed: 01/29/2022 5:32:10 PM By: Massie Kluver Entered By: Massie Kluver on 01/25/2022 15:31:12 -------------------------------------------------------------------------------- Wound Assessment Details Patient Name: Date of Service: Robert Levine [facility] 01/25/2022 3:00 PM Medical Record Number: [id number] Patient Account Number: [id number] Date of Birth/Sex: Treating RN: [birth year] (66 y.o. [name] Primary Care Sameria Morss: SYSTEM, PCP Other Clinician: Massie Kluver Referring Raksha Wolfgang: Treating Tajae Rybicki/Extender: Garry Heater in Treatment: 1 Wound Status Wound Number: 1 Primary Etiology: Diabetic Wound/Ulcer of the Lower Extremity Wound Location: Right Metatarsal head first Wound Status: Open Wounding Event: Gradually Appeared Comorbid History: Hypertension, Type II  Diabetes Date Acquired: 04/02/2021 Weeks Of Treatment: 1 Clustered Wound:  No Photos Robert Levine, Robert Levine ([id number]) [id number] Page 8 of 9 Wound Measurements Length: (cm) 0.9 Width: (cm) 0.7 Depth: (cm) 0.2 Area: (cm) 0.495 Volume: (cm) 0.099 % Reduction in Area: 22.2% % Reduction in Volume: 48.2% Epithelialization: None Wound Description Classification: Grade 1 Exudate Amount: Medium Exudate Type: Serosanguineous Exudate Color: red, brown Foul Odor After Cleansing: No Slough/Fibrino Yes Wound Bed Granulation Amount: Large (67-100%) Exposed Structure Granulation Quality: Red Fascia Exposed: No Necrotic Amount: Small (1-33%) Fat Layer (Subcutaneous Tissue) Exposed: Yes Tendon Exposed: No Muscle Exposed: No Joint Exposed: No Bone Exposed: No Treatment Notes Wound #1 (Metatarsal head first) Wound Laterality: Right Cleanser Peri-Wound Care Topical Primary Dressing Hydrofera Blue Ready Transfer Foam, 2.5x2.5 (in/in) Discharge Instruction: Apply Hydrofera Blue Ready to wound bed as directed Secondary Dressing Coverlet Latex-Free Fabric Adhesive Dressings Discharge Instruction: 1.5 x 2 Secured With Compression Wrap Compression Stockings Add-Ons Electronic Signature(s) Signed: 01/25/2022 4:06:10 PM By: Gretta Cool, [name], RN, CWS, [name], [name] Signed: 01/29/2022 5:32:10 PM By: Massie Kluver Entered By: Massie Kluver on 01/25/2022 15:18:45 Robert Levine ([id number]) [id number] Page 9 of 9 -------------------------------------------------------------------------------- Vitals Details Patient Name: Date of Service: Robert Levine [facility] 01/25/2022 3:00 PM Medical Record Number: [id number] Patient Account Number: [id number] Date of Birth/Sex: Treating RN: 1956-02-02 (66 y.o. [name] Primary Care Reshunda Strider: SYSTEM, PCP Other Clinician: Massie Kluver Referring Miamor Ayler: Treating Carlisia Geno/Extender: Garry Heater in Treatment: 1 Vital Signs Time Taken: 15:12 Temperature (F):  98.1 Height (in): 76 Pulse (bpm): 68 Weight (lbs): 267 Respiratory Rate (breaths/min): 18 Body Mass Index (BMI): 32.5 Blood Pressure (mmHg): 160/83 Reference Range: 80 - 120 mg / dl Electronic Signature(s) Signed: 01/29/2022 5:32:10 PM By: Massie Kluver Entered By: Massie Kluver on 01/25/2022 15:13:48

## 2022-01-25 NOTE — Progress Notes (Addendum)
Robert Levine ([id number]) [id number] Page 1 of 7 Visit Report for 01/25/2022 Chief Complaint Document Details Patient Name: Date of Service: Robert Levine [facility] 01/25/2022 3:00 PM Medical Record Number: [id number] Patient Account Number: [id number] Date of Birth/Sex: Treating RN: 09/13/1955 (66 y.o. Robert Levine Primary Care Provider: SYSTEM, PCP Other Clinician: Massie Kluver Referring Provider: Treating Provider/Extender: Garry Heater in Treatment: 1 Information Obtained from: Patient Chief Complaint Right foot ulcer at amputation site Electronic Signature(s) Signed: 01/25/2022 3:11:09 PM By: Worthy Keeler PA-C Entered By: Worthy Keeler on 01/25/2022 15:11:08 -------------------------------------------------------------------------------- Debridement Details Patient Name: Date of Service: Robert Levine, Robert Levine 01/25/2022 3:00 PM Medical Record Number: [id number] Patient Account Number: [id number] Date of Birth/Sex: Treating RN: 1955/07/24 (66 y.o. Robert Levine Primary Care Provider: SYSTEM, PCP Other Clinician: Massie Kluver Referring Provider: Treating Provider/Extender: Garry Heater in Treatment: 1 Debridement Performed for Assessment: Wound #1 Right Metatarsal head first Performed By: Physician Tommie Sams., PA-C Debridement Type: Debridement Severity of Tissue Pre Debridement: Fat layer exposed Level of Consciousness (Pre-procedure): Awake and Alert Pre-procedure Verification/Time Out Yes - 15:27 Taken: Start Time: 15:27 T Area Debrided (L x W): otal 1.5 (cm) x 1.5 (cm) = 2.25 (cm) Tissue and other material debrided: Viable, Non-Viable, Callus, Slough, Subcutaneous, Slough Level: Skin/Subcutaneous Tissue Debridement Description: Excisional Instrument: Curette Bleeding: Minimum Hemostasis Achieved: Pressure End Time: 15:30 Response to Treatment: Procedure was tolerated well Level of  Consciousness (Post- Awake and Alert procedure): Robert Levine, Robert Levine ([id number]) [id number] Page 2 of 7 Post Debridement Measurements of Total Wound Length: (cm) 0.9 Width: (cm) 0.7 Depth: (cm) 0.3 Volume: (cm) 0.148 Character of Wound/Ulcer Post Debridement: Stable Severity of Tissue Post Debridement: Fat layer exposed Post Procedure Diagnosis Same as Pre-procedure Electronic Signature(s) Signed: 01/25/2022 4:06:10 PM By: Gretta Cool, [name], RN, CWS, [name], [name] Signed: 01/25/2022 5:00:04 PM By: Worthy Keeler PA-C Signed: 01/29/2022 5:32:10 PM By: Massie Kluver Entered By: Massie Kluver on 01/25/2022 15:30:22 -------------------------------------------------------------------------------- HPI Details Patient Name: Date of Service: Robert Levine, Robert Levine 01/25/2022 3:00 PM Medical Record Number: [id number] Patient Account Number: [id number] Date of Birth/Sex: Treating RN: 12-03-1955 (66 y.o. Robert Levine Primary Care Provider: SYSTEM, PCP Other Clinician: Massie Kluver Referring Provider: Treating Provider/Extender: Garry Heater in Treatment: 1 History of Present Illness HPI Description: 01-15-2022 patient presents today as a referral from Dr. Caroline More who is a local podiatrist due to a foot wound that is not getting better. The patient unfortunately has had this going on for quite some time he is not necessarily the best historian which makes it a little bit difficult but nonetheless I did review notes as well together where things stand what is going on right now has been using Vaseline over the area according to what he tells me he has had debridements with Dr. Luana Shu intermittently. With that being said the patient has not really been using his offloading shoe as much she does have diabetic shoes and he tells me that is primarily what has been wearing. 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 He also has high blood pressure. 01-25-2022 upon evaluation today patient's wound actually showing signs of improvement though there is still little bit of callus and some biofilm and slough buildup on the surface of the wound. I Minna perform sharp debridement to clear  this away today. He tells me has been using the shoe a lot of the time though sometimes when he is at the assisted living facility he just walks around with a sock not using the shoe. I explained that he needs to make sure to always have the shoe on when he is up and moving around as can help this to heal most effectively. Electronic Signature(s) Signed: 01/25/2022 4:10:04 PM By: Worthy Keeler PA-C Entered By: Worthy Keeler on 01/25/2022 16:10:04 Robert Levine ([id number]) [id number] Page 3 of 7 -------------------------------------------------------------------------------- Physical Exam Details Patient Name: Date of Service: Robert Levine Robert Levine 01/25/2022 3:00 PM Medical Record Number: [id number] Patient Account Number: [id number] Date of Birth/Sex: Treating RN: 10/16/1955 (66 y.o. Robert Levine Primary Care Provider: SYSTEM, PCP Other Clinician: Massie Kluver Referring Provider: Treating Provider/Extender: Garry Heater in Treatment: 1 Constitutional Well-nourished and well-hydrated in no acute distress. Respiratory normal breathing without difficulty. Psychiatric this patient is able to make decisions and demonstrates good insight into disease process. Alert and Oriented x 3. pleasant and cooperative. Notes Upon inspection patient's wound bed actually showed signs of good granulation and epithelization at this point. Fortunately I do not see any evidence of active infection at this point which is good news. No fevers, chills, nausea, vomiting, or  diarrhea. Electronic Signature(s) Signed: 01/25/2022 4:10:22 PM By: Worthy Keeler PA-C Entered By: Worthy Keeler on 01/25/2022 16:10:22 -------------------------------------------------------------------------------- Physician Orders Details Patient Name: Date of Service: Robert Levine, Robert [facility] 01/25/2022 3:00 PM Medical Record Number: [id number] Patient Account Number: [id number] Date of Birth/Sex: Treating RN: [birth year] (66 y.o. 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 [facility] for wound care. May utilize formulary equivalent dressing for wound treatment orders unless otherwise specified. Home Health Nurse may visit PRN to address patients wound care needs. - [facility] [phone] Bathing/ Shower/ Hygiene Clean wound with Normal Saline or wound cleanser. May shower with wound dressing protected with water repellent cover or cast protector. No tub bath. Anesthetic (Use 'Patient Medications' Section for Anesthetic Order Entry) Robert Levine, Robert Levine ([id number]) [id number] Page 4 of 7 Lidocaine applied to wound bed Edema Control - Lymphedema / Segmental Compressive Device / Other Elevate, Exercise Daily and A void Standing for Long Periods of Time. Elevate legs to the level of the heart and pump ankles as often as possible Elevate leg(s) parallel to the floor when sitting. Off-Loading Other: - front offloading  shoe Wound Treatment Wound #1 - Metatarsal head first Wound Laterality: Right Prim Dressing: Hydrofera Blue Ready Transfer Foam, 2.5x2.5 (in/in) 3 x Per Week ary Discharge Instructions: Apply Hydrofera Blue Ready to wound bed as directed Secondary Dressing: Coverlet Latex-Free Fabric Adhesive Dressings 3 x Per Week Discharge Instructions: 1.5 x 2 Electronic Signature(s) Signed: 01/25/2022 5:00:04 PM By: Worthy Keeler PA-C Signed: 01/29/2022 5:32:10 PM By: Massie Kluver Entered By: Massie Kluver on 01/25/2022 15:30:35 -------------------------------------------------------------------------------- Problem List Details Patient Name: Date of Service: Robert Levine, Robert Levine 01/25/2022 3:00 PM Medical Record Number: [id number] Patient Account Number: [id number] Date of Birth/Sex:  Treating RN: April 22, 1955 (66 y.o. Robert Levine Primary Care Provider: SYSTEM, PCP Other Clinician: Massie Kluver Referring Provider: Treating Provider/Extender: Garry Heater in Treatment: 1 Active Problems ICD-10 Encounter Code Description Active Date MDM Diagnosis E11.621 Type 2 diabetes mellitus with foot ulcer 01/15/2022 No Yes L97.512 Non-pressure chronic ulcer of other part of right foot with fat layer exposed 01/15/2022 No Yes Z89.411 Acquired absence of right great toe 01/15/2022 No Yes N18.30 Chronic kidney disease, stage 3 unspecified 01/15/2022 No Yes I10 Essential (primary) hypertension 01/15/2022 No Yes Inactive Problems Robert Levine, Robert Levine ([id number]) [id number] Page 5 of 7 Resolved Problems Electronic Signature(s) Signed: 01/25/2022 3:11:02 PM By: Worthy Keeler PA-C Entered By: Worthy Keeler on 01/25/2022 15:11:02 -------------------------------------------------------------------------------- Progress Note Details Patient Name: Date of Service: Robert Levine, Robert [facility] 01/25/2022 3:00 PM Medical Record Number: [id number] Patient Account Number:  [id number] Date of Birth/Sex: Treating RN: 20-Jul-1955 (66 y.o. Robert Levine Primary Care Provider: SYSTEM, PCP Other Clinician: Massie Kluver Referring Provider: Treating Provider/Extender: Garry Heater in Treatment: 1 Subjective Chief Complaint Information obtained from Patient Right foot ulcer at amputation site History of Present Illness (HPI) 01-15-2022 patient presents today as a referral from Dr. Caroline More who is a local podiatrist due to a foot wound that is not getting better. The patient unfortunately has had this going on for quite some time he is not necessarily the best historian which makes it a little bit difficult but nonetheless I did review notes as well together where things stand what is going on right now has been using Vaseline over the area according to what he tells me he has had debridements with Dr. Luana Shu intermittently. With that being said the patient has not really been using his offloading shoe as much she does have diabetic shoes and he tells me that is primarily what has been wearing. 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 He also has high blood pressure. 01-25-2022 upon evaluation today patient's wound actually showing signs of improvement though there is still little bit of callus and some biofilm and slough buildup on the surface of the wound. I Minna perform sharp debridement to clear this away today. He tells me has been using the shoe a lot of the time though sometimes when he is at the assisted living facility he just walks around with a sock not using the shoe. I explained that he needs to make sure to always have the shoe on when he is up and moving around as can help this to heal most effectively. Objective Constitutional Well-nourished and well-hydrated in no acute  distress. Vitals Time Taken: 3:12 PM, Height: 76 in, Weight: 267 lbs, BMI: 32.5, Temperature: 98.1 F, Pulse: 68 bpm, Respiratory Rate: 18 breaths/min, Blood Pressure: 160/83 mmHg. Respiratory normal breathing without difficulty. Psychiatric this patient is able to make decisions and demonstrates good insight into disease process. Alert and Oriented x 3. pleasant and cooperative. General Notes: Upon inspection patient's wound bed actually showed signs of good granulation and epithelization at this point. Fortunately I do not see any evidence of active infection at this point which is good news. No fevers, chills, nausea, vomiting, or diarrhea. Integumentary (Hair, Skin) Wound #1 status is Open. Original cause of wound was Gradually Appeared. The date acquired was: 04/02/2021. The wound has been in treatment 1 weeks. The  wound is located on the Right Metatarsal head first. The wound measures 0.9cm length x 0.7cm width x 0.2cm depth; 0.495cm^2 area and 0.099cm^3 volume. There is Fat Layer (Subcutaneous Tissue) exposed. There is a medium amount of serosanguineous drainage noted. There is large (67-100%) red granulation Robert Levine, Robert Levine ([id number]) [id number] Page 6 of 7 within the wound bed. There is a small (1-33%) amount of necrotic tissue within the wound bed. 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 There was a Excisional Skin/Subcutaneous Tissue Debridement with a total area of 2.25 sq cm performed by Tommie Sams., PA-C. With the  following instrument(s): Curette to remove Viable and Non-Viable tissue/material. Material removed includes Callus, Subcutaneous Tissue, and Slough. A time out was conducted at 15:27, prior to the start of the procedure. A Minimum amount of bleeding was controlled with Pressure. The procedure was tolerated well. Post Debridement Measurements: 0.9cm length x 0.7cm width x 0.3cm depth; 0.148cm^3 volume. Character of Wound/Ulcer Post Debridement is stable. Severity of Tissue Post Debridement is: Fat layer exposed. Post procedure Diagnosis Wound #1: Same as Pre-Procedure Plan Follow-up Appointments: Return Appointment in 1 week. Home Health: [facility] for wound care. May utilize formulary equivalent dressing for wound treatment orders unless otherwise specified. Home Health Nurse may visit PRN to address patientoos wound care needs. - [facility] [phone] Bathing/ Shower/ Hygiene: Clean wound with Normal Saline or wound cleanser. May shower with wound dressing protected with water repellent cover or cast protector. No tub bath. Anesthetic (Use 'Patient Medications' Section for Anesthetic Order Entry): Lidocaine applied to wound bed Edema Control - Lymphedema / Segmental Compressive Device / Other: Elevate, Exercise Daily and Avoid Standing for Long Periods of Time. Elevate legs to the level of the heart and pump ankles as often as possible Elevate leg(s) parallel to the floor when sitting. Off-Loading: Other: - front offloading shoe WOUND #1: - Metatarsal head first Wound Laterality: Right Prim Dressing: Hydrofera Blue Ready Transfer Foam, 2.5x2.5 (in/in) 3 x Per Week/ ary Discharge Instructions: Apply Hydrofera Blue Ready to wound bed as directed Secondary Dressing: Coverlet Latex-Free Fabric Adhesive Dressings 3 x Per Week/ Discharge Instructions: 1.5 x 2 1. Based on what I am seeing I do believe that the patient would benefit from a continuation of therapy utilizing the  [facility] I think that still good to be well. 2. We will use just a large Band-Aid cover. 3. I am also going to suggest that he should continue to monitor for any evidence of worsening although he needs to use in the offloading shoe daily and in fact all day every day. If he is up walking on it I want him to have the shoe on. He states that he will do so. We will see patient back for reevaluation in 1 week here in the clinic. If anything worsens or changes patient will contact our office for additional recommendations. Electronic Signature(s) Signed: 01/25/2022 4:10:58 PM By: Worthy Keeler PA-C Entered By: Worthy Keeler on 01/25/2022 16:10:57 Robert Levine ([id number]) [id number] Page 7 of 7 -------------------------------------------------------------------------------- SuperBill Details Patient Name: Date of Service: Robert Lev[facility] 01/25/2022 Medical  Record Number: [id number] Patient Account Number: [id number] Date of Birth/Sex: Treating RN: 1955-07-15 (66 y.o. 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 N18.30 Chronic kidney disease, stage 3 unspecified I10 Essential (primary) hypertension Facility Procedures : CPT4 Code: [id number] Description: [id number] - DEB SUBQ TISSUE 20 SQ CM/< ICD-10 Diagnosis Description L97.512 Non-pressure chronic ulcer of other part of right foot with fat layer exposed Modifier: Quantity: 1 Physician Procedures : CPT4 Code Description Modifier [id number] - WC PHYS SUBQ TISS 20 SQ CM ICD-10 Diagnosis Description L97.512 Non-pressure chronic ulcer of other  part of right foot with fat layer exposed Quantity: 1 Electronic Signature(s) Signed: 01/25/2022 4:11:08 PM By: Worthy Keeler PA-C Entered By: Worthy Keeler on 01/25/2022 16:11:08

## 2022-01-29 ENCOUNTER — Encounter (INDEPENDENT_AMBULATORY_CARE_PROVIDER_SITE_OTHER): Payer: Self-pay

## 2022-02-01 ENCOUNTER — Encounter: Payer: Medicare Other | Attending: Physician Assistant | Admitting: Physician Assistant

## 2022-02-01 DIAGNOSIS — L97512 Non-pressure chronic ulcer of other part of right foot with fat layer exposed: Secondary | ICD-10-CM | POA: Diagnosis not present

## 2022-02-01 DIAGNOSIS — E1122 Type 2 diabetes mellitus with diabetic chronic kidney disease: Secondary | ICD-10-CM | POA: Diagnosis not present

## 2022-02-01 DIAGNOSIS — Z89411 Acquired absence of right great toe: Secondary | ICD-10-CM | POA: Diagnosis not present

## 2022-02-01 DIAGNOSIS — E11621 Type 2 diabetes mellitus with foot ulcer: Secondary | ICD-10-CM | POA: Diagnosis present

## 2022-02-01 DIAGNOSIS — N183 Chronic kidney disease, stage 3 unspecified: Secondary | ICD-10-CM | POA: Diagnosis not present

## 2022-02-01 DIAGNOSIS — I129 Hypertensive chronic kidney disease with stage 1 through stage 4 chronic kidney disease, or unspecified chronic kidney disease: Secondary | ICD-10-CM | POA: Insufficient documentation

## 2022-02-01 NOTE — Progress Notes (Addendum)
Robert Levine, Charli ([id number]) [id number] Page 1 of 7 Visit Report for 02/01/2022 Chief Complaint Document Details Patient Name: Date of Service: [facility]30 [facility] Medical Record Number: [id number] Patient Account Number: [id number] Date of Birth/Sex: Treating RN: [birth year] (66 y.o. Arthur HolmsM) Woody, [name] Primary Care Provider: SYSTEM, PCP Other Clinician: Betha LoaVenable, Angie Referring Provider: Treating Provider/Extender: Silvestre MomentStone, Rakeen Gaillard Baker, [name] in Treatment: 2 Information Obtained from: Patient Chief Complaint Right foot ulcer at amputation site Electronic Signature(s) Signed: 02/01/2022 1:45:50 PM By: Lenda KelpStone III, Rayhana Slider PA-C Entered By: Lenda KelpStone III, Everest Hacking on 02/01/2022 13:45:50 -------------------------------------------------------------------------------- Debridement Details Patient Name: Date of Service: Robert FraiseBURNHA Levine, [facility]01[facility] 1:30 PM Medical Record Number: [id number] Patient Account Number: [id number] Date of Birth/Sex: Treating RN: [birth year] (66 y.o. Arthur HolmsM) Woody, [name] Primary Care Provider: SYSTEM, PCP Other Clinician: Betha LoaVenable, Angie Referring Provider: Treating Provider/Extender: Silvestre MomentStone, Maliea Grandmaison Baker, [name] in Treatment: 2 Debridement Performed for Assessment: Wound #1 Right Metatarsal head first Performed By: Physician Nelida MeuseStone, Lyam Provencio E., PA-C Debridement Type: Debridement Severity of Tissue Pre Debridement: Fat layer exposed Level of Consciousness (Pre-procedure): Awake and Alert Pre-procedure Verification/Time Out Yes - 14:21 Taken: Start Time: 14:21 T Area Debrided (L x W): otal 1 (cm) x 1 (cm) = 1 (cm) Tissue and other material debrided: Viable, Non-Viable, Callus, Slough, Subcutaneous, Slough Level: Skin/Subcutaneous Tissue Debridement Description: Excisional Instrument: Curette Bleeding: Minimum Hemostasis Achieved: Pressure End Time: 14:25 Response to Treatment: Procedure was tolerated well Level of Consciousness  (Post- Awake and Alert procedure): Robert Levine, Robert Levine ([id number]) [id number] Page 2 of 7 Post Debridement Measurements of Total Wound Length: (cm) 0.8 Width: (cm) 0.7 Depth: (cm) 0.2 Volume: (cm) 0.088 Character of Wound/Ulcer Post Debridement: Stable Severity of Tissue Post Debridement: Fat layer exposed Post Procedure Diagnosis Same as Pre-procedure Electronic Signature(s) Signed: 02/01/2022 2:35:39 PM By: Elliot GurneyWoody, [name], RN, CWS, [name], [name] Signed: 02/01/2022 5:42:41 PM By: Lenda KelpStone III, Taisia Fantini PA-C Signed: 02/06/2022 5:15:48 PM By: Betha LoaVenable, Angie Entered By: Betha LoaVenable, Angie on 02/01/2022 14:25:21 -------------------------------------------------------------------------------- HPI Details Patient Name: Date of Service: Robert FraiseBURNHA Levine, Robert Levine [facility] 02/01/2022 1:30 PM Medical Record Number: [id number] Patient Account Number: [id number] Date of Birth/Sex: Treating RN: [birth year](65 y.o. Arthur HolmsM) Woody, [name] Primary Care Provider: SYSTEM, PCP Other Clinician: Betha LoaVenable, Angie Referring Provider: Treating Provider/Extender: Silvestre MomentStone, Damita Eppard Baker, [name] in Treatment: 2 History of Present Illness HPI Description: 01-15-2022 patient presents today as a referral from Dr. Rosetta PosnerAndrew Baker who is a local podiatrist due to a foot wound that is not getting better. The patient unfortunately has had this going on for quite some time he is not necessarily the best historian which makes it a little bit difficult but nonetheless I did review notes as well together where things stand what is going on right now has been using Vaseline over the area according to what he tells me he has had debridements with Dr. Excell SeltzerBaker intermittently. With that being said the patient has not really been using his offloading shoe as much she does have diabetic shoes and he tells me that is primarily what has been wearing. 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 He also has high blood pressure. 01-25-2022 upon evaluation today patient's wound actually showing signs of improvement though there is still little bit of callus and some biofilm and slough buildup on the surface of the wound. I Minna perform sharp debridement to clear  this away today. He tells me has been using the shoe a lot of the time though sometimes when he is at the assisted living facility he just walks around with a sock not using the shoe. I explained that he needs to make sure to always have the shoe on when he is up and moving around as can help this to heal most effectively. 02-01-2022 upon evaluation today patient appears to be doing well currently in regard to his wound from the standpoint of this not getting worse it does not look to be infected. With that being said unfortunately he does have an open wound that is going to require some sharp debridement today and again I am concerned that if we do not get this closed he is going to end up with this worsening in general. He has not really been wearing the postop shoe with front offloading unfortunately which means that he is not really getting much better. I think that a total contact cast is probably going to be the method in which to try to get this healed as quickly as possible. Again the sooner we get healed lessen the chance that this will develop into a more significant ulceration requiring any type of surgery. We especially would avoid any chance for amputation. Electronic Signature(s) Signed: 02/01/2022 2:53:50 PM By: Lenda Kelp PA-C Entered By: Lenda Kelp on 02/01/2022 14:53:50 Robert Cedar ([id number]) [id number] Page 3 of 7 -------------------------------------------------------------------------------- Physical Exam Details Patient Name: Date of Service: Robert Fantasia [facility] 02/01/2022 1:30  PM Medical Record Number: [id number] Patient Account Number: [id number] Date of Birth/Sex: Treating RN: [birth year] (66 y.o. [name] Primary Care Provider: SYSTEM, PCP Other Clinician: Betha Loa Referring Provider: Treating Provider/Extender: Silvestre Moment in Treatment: 2 Constitutional Well-nourished and well-hydrated in no acute distress. Respiratory normal breathing without difficulty. Psychiatric this patient is able to make decisions and demonstrates good insight into disease process. Alert and Oriented x 3. pleasant and cooperative. Notes Upon inspection patient's wound bed actually showed signs of good granulation and epithelization at this point. Fortunately there does not appear to be any evidence of infection locally or systemically which is great news and overall I am extremely pleased with where things stand currently. He did have some slough and biofilm as well as callus buildup which I did perform debridement of clearway. Postdebridement everything appears to be doing much better which is great news. Electronic Signature(s) Signed: 02/01/2022 2:54:21 PM By: Lenda Kelp PA-C Entered By: Lenda Kelp on 02/01/2022 14:54:21 -------------------------------------------------------------------------------- Physician Orders Details Patient Name: Date of Service: Robert Levine, Robert Levine [facility] 02/01/2022 1:30 PM Medical Record Number: [id number] Patient Account Number: [id number] Date of Birth/Sex: Treating RN: March 12, 1956 (66 y.o. 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 Home Health [facility] for wound care. May utilize formulary equivalent dressing for wound treatment orders unless otherwise specified. Home Health Nurse may visit PRN to address patients wound care needs. - [facility] [phone] Bathing/ Shower/ Hygiene Clean wound  with Normal Saline or wound cleanser. May shower with wound dressing protected with water repellent cover or cast protector. No tub bath. Anesthetic (Use 'Patient Medications' Section for Anesthetic Order Entry) Lidocaine applied to wound bed Edema Control - Lymphedema / Segmental Compressive Device / Other Elevate, Exercise Daily and A void Standing for Long Periods of Time. Elevate legs to the level of the heart and pump ankles as often as possible Elevate leg(s) parallel to the floor when sitting. Off-Loading Total Contact Cast to Right Lower Extremity - TCC planned for next visit Other: - front offloading shoe Wound Treatment Wound #1 - Metatarsal head first Wound Laterality: Right Prim Dressing: Hydrofera Blue Ready Transfer Foam, 2.5x2.5 (in/in) 3 x Per Week ary Discharge Instructions: Apply Hydrofera Blue Ready to wound bed as directed Secondary Dressing: Coverlet Latex-Free Fabric Adhesive Dressings 3 x Per Week Discharge Instructions: 1.5 x 2 Electronic Signature(s) Signed: 02/01/2022 5:42:41 PM By: Lenda Kelp PA-C Signed: 02/06/2022 5:15:48 PM By: Betha Loa Entered By: Betha Loa on 02/01/2022 14:26:16 -------------------------------------------------------------------------------- Problem List Details Patient Name: Date of Service: Robert Levine, Robert Levine [facility] 02/01/2022 1:30 PM Medical Record Number: [id number] Patient Account Number: [id number] Date of Birth/Sex: Treating  RN: September 11, 1955 (66 y.o. [name] Primary Care Provider: SYSTEM, PCP Other Clinician: Betha Loa Referring Provider: Treating Provider/Extender: Silvestre Moment in Treatment: 2 Active Problems ICD-10 Encounter Code Description Active Date MDM Diagnosis E11.621 Type 2 diabetes mellitus with foot ulcer 01/15/2022 No Yes L97.512 Non-pressure chronic ulcer of other part of right foot with fat layer exposed 01/15/2022 No Yes Z89.411 Acquired absence of right great toe 01/15/2022 No Yes SAIF, PETER ([id number]) [id number] Page 5 of 7 N18.30 Chronic kidney disease, stage 3 unspecified 01/15/2022 No Yes I10 Essential (primary) hypertension 01/15/2022 No Yes Inactive Problems Resolved Problems Electronic Signature(s) Signed: 02/01/2022 1:45:48 PM By: Lenda Kelp PA-C Entered By: Lenda Kelp on 02/01/2022 13:45:47 -------------------------------------------------------------------------------- Progress Note Details Patient Name: Date of Service: Robert Levine, Robert Levine [facility] 02/01/2022 1:30 PM Medical Record Number: [id number] Patient Account Number: [id number] Date of Birth/Sex: Treating RN: May 05, 1955 (66 y.o. [name] Primary Care Provider: SYSTEM, PCP Other Clinician: Betha Loa Referring Provider: Treating Provider/Extender: Silvestre Moment in Treatment: 2 Subjective Chief Complaint Information obtained from Patient Right foot ulcer at amputation site History of Present Illness (HPI) 01-15-2022 patient presents today as a referral from Dr. Rosetta Posner who is a local podiatrist due to a foot wound that is not getting better. The patient unfortunately has had this going on for quite some time he is not necessarily the best historian which makes it a little bit difficult but nonetheless I did review notes as well together where things stand what is going on right now has been using Vaseline over the area  according to what he tells me he has had debridements with Dr. Excell Seltzer intermittently. With that being said the patient has not really been using his offloading shoe as much she does have diabetic shoes and he tells me that is primarily what has been wearing. 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 He also has high blood pressure. 01-25-2022 upon evaluation today patient's wound actually showing signs of improvement though there is still little bit of callus and some biofilm and slough buildup on the surface of the wound. I Minna perform sharp  debridement to clear this away today. He tells me has been using the shoe a lot of the time though sometimes when he is at the assisted living facility he just walks around with a sock not using the shoe. I explained that he needs to make sure to always have the shoe on when he is up and moving around as can help this to heal most effectively. 02-01-2022 upon evaluation today patient appears to be doing well currently in regard to his wound from the standpoint of this not getting worse it does not look to be infected. With that being said unfortunately he does have an open wound that is going to require some sharp debridement today and again I am concerned that if we do not get this closed he is going to end up with this worsening in general. He has not really been wearing the postop shoe with front offloading unfortunately which means that he is not really getting much better. I think that a total contact cast is probably going to be the method in which to try to get this healed as quickly as possible. Again the sooner we get healed lessen the chance that this will develop into a more significant ulceration requiring any type of surgery. We especially would avoid any chance for amputation. [address] SHIRLEY, BOLLE ([id number]) [id number] Page 6 of 7 Constitutional Well-nourished and well-hydrated in no acute distress. Vitals Time Taken: 1:38 PM, Height: 76 in, Weight: 267 lbs, BMI: 32.5, Temperature: 98.2 F, Pulse: 73 bpm, Respiratory Rate: 18 breaths/min, Blood Pressure: 157/83 mmHg. Respiratory normal breathing without difficulty. Psychiatric this patient is able to make decisions and demonstrates good insight into disease process. Alert and Oriented x 3. pleasant and cooperative. General Notes: Upon inspection patient's wound bed actually showed signs of good granulation and epithelization at this point. Fortunately there does not appear to be any evidence of infection locally or systemically which is great news and overall I am extremely pleased with where things stand currently. He did have some slough and biofilm as well as callus buildup which I did perform debridement of clearway. Postdebridement everything appears to be doing much better which is great news. Integumentary (Hair, Skin) Wound #1 status is Open. Original cause of wound was Gradually Appeared. The date acquired was: 04/02/2021. The wound has been in treatment 2 weeks. The wound is located on the Right Metatarsal head first. The wound measures 0.8cm length x 0.7cm width x 0.2cm depth; 0.44cm^2 area and 0.088cm^3 volume. There is Fat Layer (Subcutaneous Tissue) exposed. There is no tunneling or undermining noted. There is a medium amount of serosanguineous drainage noted. There is large (67-100%) red granulation within the wound bed. There is a small (1-33%) amount of necrotic tissue within the wound bed including Adherent Slough. 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 There was a Excisional Skin/Subcutaneous Tissue Debridement with a total area of 1 sq cm performed by Nelida Meuse., PA-C. With the following instrument(s): Curette to remove Viable and Non-Viable tissue/material. Material removed includes Callus, Subcutaneous Tissue, and Slough. A time out was conducted at 14:21, prior to the  start of the procedure. A Minimum amount of bleeding was controlled with Pressure. The procedure was tolerated well. Post Debridement Measurements: 0.8cm length x 0.7cm width x 0.2cm depth; 0.088cm^3 volume. Character of Wound/Ulcer Post Debridement is stable. Severity of Tissue Post Debridement is: Fat layer exposed. Post procedure Diagnosis Wound #1: Same as Pre-Procedure Plan Follow-up Appointments: Return Appointment in 1 week. [facility] Health: [facility] for [facility] care. May utilize formulary equivalent dressing for wound treatment orders unless otherwise specified. Home Health Nurse may visit PRN to address patientoos wound care needs. - [facility] [phone] Bathing/ Shower/ Hygiene: Clean wound with Normal Saline or wound cleanser. May shower with wound dressing protected with water repellent cover or cast protector. No tub bath. Anesthetic (Use 'Patient Medications' Section for Anesthetic Order Entry): Lidocaine applied to wound bed Edema Control - Lymphedema / Segmental Compressive Device / Other: Elevate, Exercise Daily and Avoid Standing for Long Periods of Time. Elevate legs to the level of the heart and pump ankles as often as possible Elevate leg(s) parallel to the floor when sitting. Off-Loading: T Contact Cast to Right Lower Extremity - TCC planned for next visit otal Other: - front offloading shoe WOUND #1: - Metatarsal  head first Wound Laterality: Right Prim Dressing: Hydrofera Blue Ready Transfer Foam, 2.5x2.5 (in/in) 3 x Per Week/ ary Discharge Instructions: Apply Hydrofera Blue Ready to wound bed as directed Secondary Dressing: Coverlet Latex-Free Fabric Adhesive Dressings 3 x Per Week/ Discharge Instructions: 1.5 x 2 Robert Levine, Robert Levine ([id number]) [id number] Page 7 of 7 1. I am going to suggest that we have the patient continue to monitor for any signs of worsening or infection. Obviously if anything changes he can contact the office and let me know. 2. I am going to suggest as well that we see about getting him scheduled for casting next week. We will put the cast on at the beginning of the week and then have him follow-up towards the end of the week in order to change this out for the first time. My hope is that we will be able to see good improvement fairly quickly. He is in agreement with the plan though he does not like the idea of the cast he knows it may be necessary. We will see patient back for reevaluation in 1 week here in the clinic. If anything worsens or changes patient will contact our office for additional recommendations. Electronic Signature(s) Signed: 02/01/2022 2:55:06 PM By: Lenda Kelp PA-C Entered By: Lenda Kelp on 02/01/2022 14:55:06 -------------------------------------------------------------------------------- SuperBill Details Patient Name: Date of Service: Robert Levine, Robert Levine [facility] 02/01/2022 Medical Record Number: [id number] Patient Account Number: [id number] Date of Birth/Sex: Treating RN: [birth year] (66 y.o. [name] Primary Care Provider: SYSTEM, PCP Other Clinician: Betha Loa Referring Provider: Treating Provider/Extender: Silvestre Moment in Treatment: 2 Diagnosis Coding ICD-10 Codes Code Description [id number] Type 2 diabetes mellitus with foot ulcer L97.512 Non-pressure chronic ulcer of other part of right foot  with fat layer exposed Z89.411 Acquired absence of right great toe N18.30 Chronic kidney disease, stage 3 unspecified I10 Essential (primary) hypertension Facility Procedures : CPT4 Code: [id number] Description: 11042 - DEB SUBQ TISSUE 20 SQ CM/< ICD-10 Diagnosis Description L97.512 Non-pressure chronic ulcer of other part of right foot with fat layer exposed Modifier: Quantity: 1 Physician Procedures : CPT4 Code Description Modifier [id number] 11042 - WC PHYS SUBQ TISS 20 SQ CM ICD-10 Diagnosis Description L97.512 Non-pressure chronic ulcer of other part of right foot with fat layer exposed  Quantity: 1 Electronic Signature(s) Signed: 02/01/2022 2:55:23 PM By: Worthy Keeler PA-C Entered By: Worthy Keeler on 02/01/2022 14:55:23

## 2022-02-06 ENCOUNTER — Encounter: Payer: Medicare Other | Admitting: Internal Medicine

## 2022-02-06 DIAGNOSIS — E11621 Type 2 diabetes mellitus with foot ulcer: Secondary | ICD-10-CM | POA: Diagnosis not present

## 2022-02-07 NOTE — Progress Notes (Signed)
Levine, Robert ([id number]) [id number] Page 1 of 7 Visit Report for 02/06/2022 Arrival Information Details Patient Name: Date of Service: Robert Levine [facility] 02/06/2022 8:45 A M Medical Record Number: [id number] Patient Account Number: [id number] Date of Birth/Sex: Treating RN: [birth year] (66 y.o. [name] Primary Care Shailyn Weyandt: SYSTEM, PCP Other Clinician: Referring Shaelin Lalley: Treating Khloey Chern/Extender: Chauncey Mann, MICHA EL [name] in Treatment: 3 Visit Information History Since Last Visit Added or deleted any medications: No Patient Arrived: Ambulatory Had a fall or experienced change in No Arrival Time: 08:54 activities of daily living that may affect Transfer Assistance: None risk of falls: Patient Identification Verified: Yes Has Dressing in Place as Prescribed: Yes Secondary Verification Process Completed: Yes Has Footwear/Offloading in Place as Prescribed: Yes Patient Requires Transmission-Based Precautions: No Right: Wedge Shoe Patient Has Alerts: Yes Pain Present Now: No Patient Alerts: DIABETIC NON COMPRESSABLE Electronic Signature(s) Signed: 02/06/2022 5:24:18 PM By: Elliot Gurney, [name], RN, CWS, [name], [name] Entered By: Elliot Gurney, [name], RN, CWS, [name] on 02/06/2022 08:55:53 -------------------------------------------------------------------------------- Lower Extremity Assessment Details Patient Name: Date of Service: [name], Robert Levine 02/06/2022 8:45 A M Medical Record Number: [id number] Patient Account Number: [id number] Date of Birth/Sex: Treating RN: 05/07/1955 (66 y.o. [name] Primary Care Gillis Boardley: SYSTEM, PCP Other Clinician: Referring Torryn Hudspeth: Treating Deziray Nabi/Extender: RO BSO N, MICHA EL [name] in Treatment: 3 Edema Assessment Assessed: [Left: No] [Right: No] [Left: Edema] [Right: :] Calf Left: Right: Point of Measurement: 42 cm From Medial Instep 43 cm Ankle Left: Right: Point of Measurement: 10 cm  From Medial Instep 32.5 cm [name] ([id number]) [id number] Page 2 of 7 Knee To Floor Left: Right: From Medial Instep 51 cm Vascular Assessment Pulses: Dorsalis Pedis Palpable: [Right:Yes] Posterior Tibial Palpable: [Right:Yes] Electronic Signature(s) Signed: 02/06/2022 5:24:18 PM By: Elliot Gurney, [name], RN, CWS, [name], [name] Entered By: Elliot Gurney, [name], RN, CWS, [name] on 02/06/2022 09:04:07 -------------------------------------------------------------------------------- Multi Wound Chart Details Patient Name: Date of Service: [name], Robert Levine 02/06/2022 8:45 A M Medical Record Number: [id number] Patient Account Number: [id number] Date of Birth/Sex: Treating RN: 06-19-1955 (66 y.o. [name] Primary Care Olie Dibert: SYSTEM, PCP Other Clinician: Referring Briston Lax: Treating Ginia Rudell/Extender: RO BSO N, MICHA EL [name] in Treatment: 3 Vital Signs Height(in): 76 Pulse(bpm): 94 Weight(lbs): 267 Blood Pressure(mmHg): 172/80 Body Mass Index(BMI): 32.5 Temperature(F): 98.0 Respiratory Rate(breaths/min): 18 [1:Photos:] [N/A:N/A] Right Metatarsal head first N/A N/A Wound Location: Gradually Appeared N/A N/A Wounding Event: Diabetic Wound/Ulcer of the Lower N/A N/A Primary Etiology: Extremity Hypertension, Type II Diabetes N/A N/A Comorbid History: 04/02/2021 N/A N/A Date Acquired: 3 N/A N/A Weeks of Treatment: Open N/A N/A Wound Status: No N/A N/A Wound Recurrence: 0.9x0.8x0.3 N/A N/A Measurements L x W x D (cm) 0.565 N/A N/A A (cm) : rea 0.17 N/A N/A Volume (cm) : 11.20% N/A N/A % Reduction in A rea: 11.00% N/A N/A % Reduction in Volume: 1 Starting Position 1 (o'clock): 1 Ending Position 1 (o'clock): 0.2 Maximum Distance 1 (cm): [name] ([id number]) [id number] Page 3 of 7 Yes N/A N/A Undermining: Grade 1 N/A N/A Classification: Medium N/A N/A Exudate A mount: Serosanguineous N/A  N/A Exudate Type: red, brown N/A N/A Exudate Color: None Present (0%) N/A N/A Granulation A mount: Large (67-100%) N/A N/A Necrotic A mount: Fat Layer (Subcutaneous Tissue): Yes N/A N/A Exposed Structures: Fascia: No Tendon: No Muscle: No Joint: No Bone: No None N/A N/A Epithelialization: Treatment Notes Electronic Signature(s) Signed: 02/06/2022 5:24:18 PM By: Elliot Gurney, [name],  RN, CWS, [name], [name] Entered By: Elliot Gurney, [name], RN, CWS, [name] on 02/06/2022 09:06:05 -------------------------------------------------------------------------------- Multi-Disciplinary Care Plan Details Patient Name: Date of Service: Robert Levine Robert Levine 02/06/2022 8:45 A M Medical Record Number: [id number] Patient Account Number: [id number] Date of Birth/Sex: Treating RN: 1955/10/28 (66 y.o. [name] Primary Care Briceida Rasberry: SYSTEM, PCP Other Clinician: Referring Anisten Tomassi: Treating Arletha Marschke/Extender: RO BSO N, MICHA EL [name] in Treatment: 3 Active Inactive Abuse / Safety / Falls / Self Care Management Nursing Diagnoses: Potential for injury related to falls Goals: Patient will remain injury free related to falls Date Initiated: 01/15/2022 Target Resolution Date: 02/15/2022 Goal Status: Active Interventions: Assess Activities of Daily Living upon admission and as needed Assess fall risk on admission and as needed Assess: immobility, friction, shearing, incontinence upon admission and as needed Assess impairment of mobility on admission and as needed per policy Assess personal safety and home safety (as indicated) on admission and as needed Assess self care needs on admission and as needed Notes: Necrotic Tissue Nursing Diagnoses: Impaired tissue integrity related to necrotic/devitalized tissue Knowledge deficit related to management of necrotic/devitalized tissue Goals: Necrotic/devitalized tissue will be minimized in the wound bed [name] ([id number])  [id number] Page 4 of 7 Date Initiated: 02/06/2022 Target Resolution Date: 02/06/2022 Goal Status: Active Patient/caregiver will verbalize understanding of reason and process for debridement of necrotic tissue Date Initiated: 02/06/2022 Target Resolution Date: 02/06/2022 Goal Status: Active Interventions: Assess patient pain level pre-, during and post procedure and prior to discharge Provide education on necrotic tissue and debridement process Treatment Activities: Apply topical anesthetic as ordered : 02/06/2022 Excisional debridement : 02/06/2022 Notes: Nutrition Nursing Diagnoses: Impaired glucose control: actual or potential Goals: Patient/caregiver will maintain therapeutic glucose control Date Initiated: 01/15/2022 Target Resolution Date: 03/17/2022 Goal Status: Active Interventions: Assess HgA1c results as ordered upon admission and as needed Assess patient nutrition upon admission and as needed per policy Notes: Wound/Skin Impairment Nursing Diagnoses: Knowledge deficit related to ulceration/compromised skin integrity Goals: Patient/caregiver will verbalize understanding of skin care regimen Date Initiated: 01/15/2022 Target Resolution Date: 02/15/2022 Goal Status: Active Ulcer/skin breakdown will have a volume reduction of 30% by week 4 Date Initiated: 01/15/2022 Target Resolution Date: 03/17/2022 Goal Status: Active Ulcer/skin breakdown will have a volume reduction of 50% by week 8 Date Initiated: 01/15/2022 Target Resolution Date: 04/17/2022 Goal Status: Active Ulcer/skin breakdown will have a volume reduction of 80% by week 12 Date Initiated: 01/15/2022 Target Resolution Date: 05/18/2022 Goal Status: Active Ulcer/skin breakdown will heal within 14 weeks Date Initiated: 01/15/2022 Target Resolution Date: 06/16/2022 Goal Status: Active Interventions: Assess patient/caregiver ability to obtain necessary supplies Assess patient/caregiver  ability to perform ulcer/skin care regimen upon admission and as needed Assess ulceration(s) every visit Notes: Electronic Signature(s) Signed: 02/06/2022 5:24:18 PM By: Elliot Gurney, [name], RN, CWS, [name], [name] Entered By: Elliot Gurney, [name], RN, CWS, [name] on 02/06/2022 09:05:43 [name] ([id number]) [id number] Page 5 of 7 -------------------------------------------------------------------------------- Pain Assessment Details Patient Name: Date of Service: Robert Levine [facility] 02/06/2022 8:45 A M Medical Record Number: [id number] Patient Account Number: [id number] Date of Birth/Sex: Treating RN: 1956/03/22 (66 y.o. [name] Primary Care Amairani Shuey: SYSTEM, PCP Other Clinician: Referring Cairo Agostinelli: Treating Kaye Mitro/Extender: RO BSO N, MICHA EL [name] in Treatment: 3 Active Problems Location of Pain Severity and Description of Pain Patient Has Paino No Site Locations Pain Management and Medication Current Pain Management: Notes Patient denies pain at this time. Electronic Signature(s) Signed: 02/06/2022 5:24:18 PM By: Elliot Gurney,  [name], RN, CWS, [name], [name] Entered By: Elliot Gurney, [name], RN, CWS, [name] on 02/06/2022 51:70:01 -------------------------------------------------------------------------------- Wound Assessment Details Patient Name: Date of Service: Robert Levine [facility] 02/06/2022 8:45 A M Medical Record Number: [id number] Patient Account Number: [id number] Date of Birth/Sex: Treating RN: [birth year] (66 y.o. [name] Primary Care Heriberto Stmartin: SYSTEM, PCP Other Clinician: [name] ([id number]) [id number] Page 6 of 7 Referring Sricharan Lacomb: Treating Azeez Dunker/Extender: RO BSO N, MICHA EL [name] in Treatment: 3 Wound Status Wound Number: 1 Primary Etiology: Diabetic Wound/Ulcer of the Lower Extremity Wound Location: Right Metatarsal head first Wound Status: Open Wounding Event: Gradually Appeared Comorbid  History: Hypertension, Type II Diabetes Date Acquired: 04/02/2021 Weeks Of Treatment: 3 Clustered Wound: No Photos Wound Measurements Length: (cm) 0.9 Width: (cm) 0.8 Depth: (cm) 0.3 Area: (cm) 0.565 Volume: (cm) 0.17 % Reduction in Area: 11.2% % Reduction in Volume: 11% Epithelialization: None Undermining: Yes Starting Position (o'clock): 1 Ending Position (o'clock): 1 Maximum Distance: (cm) 0.2 Wound Description Classification: Grade 1 Exudate Amount: Medium Exudate Type: Serosanguineous Exudate Color: red, brown Foul Odor After Cleansing: No Slough/Fibrino Yes Wound Bed Granulation Amount: None Present (0%) Exposed Structure Necrotic Amount: Large (67-100%) Fascia Exposed: No Necrotic Quality: Adherent Slough Fat Layer (Subcutaneous Tissue) Exposed: Yes Tendon Exposed: No Muscle Exposed: No Joint Exposed: No Bone Exposed: No Electronic Signature(s) Signed: 02/06/2022 5:24:18 PM By: Elliot Gurney, [name], RN, CWS, [name], [name] Entered By: Elliot Gurney, [name], RN, CWS, [name] on 02/06/2022 99:35:70 -------------------------------------------------------------------------------- Vitals Details Patient Name: Date of Service: [name], Robert RGE 02/06/2022 8:45 A M Medical Record Number: [id number] Patient Account Number: [id number] Date of Birth/Sex: Treating RN: 06/30/1955 (66 y.o. Jameon, [place], [place] ([id number]) [id number] Page 7 of 7 Primary Care Nayib Remer: SYSTEM, PCP Other Clinician: Referring Ventura Hollenbeck: Treating Jag Lenz/Extender: RO BSO N, MICHA EL [name] in Treatment: 3 Vital Signs Time Taken: 08:55 Temperature (F): 98.0 Height (in): 76 Pulse (bpm): 94 Weight (lbs): 267 Respiratory Rate (breaths/min): 18 Body Mass Index (BMI): 32.5 Blood Pressure (mmHg): 172/80 Reference Range: 80 - 120 mg / dl Electronic Signature(s) Signed: 02/06/2022 5:24:18 PM By: Elliot Gurney, [name], RN, CWS, [name], [name] Entered By: Elliot Gurney, [name], RN, CWS, [name] on  02/06/2022 08:56:13

## 2022-02-07 NOTE — Progress Notes (Signed)
TAJAI, IHDE ([id number]) [id number] Page 1 of 6 Visit Report for 02/06/2022 Debridement Details Patient Name: Date of Service: Robert Levine [facility] 02/06/2022 8:45 A Levine Medical Record Number: [id number] Patient Account Number: [id number] Date of Birth/Sex: Treating RN: 1955/05/14 (66 y.o. [name] Primary Care Provider: SYSTEM, PCP Other Clinician: Referring Provider: Treating Provider/Extender: Chauncey Mann, MICHA EL [name] in Treatment: 3 Debridement Performed for Assessment: Wound #1 Right Metatarsal head first Performed By: Physician Maxwell Caul, MD Debridement Type: Debridement Severity of Tissue Pre Debridement: Fat layer exposed Level of Consciousness (Pre-procedure): Awake and Alert Pre-procedure Verification/Time Out Yes - 09:19 Taken: T Area Debrided (L x W): otal 0.9 (cm) x 0.8 (cm) = 0.72 (cm) Tissue and other material debrided: Viable, Non-Viable, Callus, Slough, Subcutaneous, Slough Level: Skin/Subcutaneous Tissue Debridement Description: Excisional Instrument: Curette Bleeding: Minimum Hemostasis Achieved: Pressure Response to Treatment: Procedure was tolerated well Level of Consciousness (Post- Awake and Alert procedure): Post Debridement Measurements of Total Wound Length: (cm) 1 Width: (cm) 0.9 Depth: (cm) 0.4 Volume: (cm) 0.283 Character of Wound/Ulcer Post Debridement: Stable Severity of Tissue Post Debridement: Fat layer exposed Post Procedure Diagnosis Same as Pre-procedure Electronic Signature(s) Signed: 02/06/2022 5:01:28 PM By: Baltazar Najjar MD Signed: 02/06/2022 5:24:18 PM By: Elliot Gurney, [name], RN, CWS, [name], [name] Entered By: Baltazar Najjar on 02/06/2022 09:30:00 -------------------------------------------------------------------------------- HPI Details Patient Name: Date of Service: Robert Levine, Robert Levine 02/06/2022 8:45 A Levine Medical Record Number: [id number] Patient Account Number: [id number] Robert Levine ([id number]) [id number] Page 2 of 6 Date of Birth/Sex: Treating RN: 1955-08-01 (66 y.o. [name] Primary Care Provider: SYSTEM, PCP Other Clinician: Referring Provider: Treating Provider/Extender: RO BSO N, MICHA EL [name] in Treatment: 3 History of Present Illness HPI Description: 01-15-2022 patient presents today as a referral from Dr. Rosetta Posner who is a local podiatrist due to a foot wound that is not getting better. The patient unfortunately has had this going on for quite some time he is not necessarily the best historian which makes it a little bit difficult but nonetheless I did review notes as well together where things stand what is going on right now has been using Vaseline over the area according to what he tells me he has had debridements with Dr. Excell Seltzer intermittently. With that being said the patient has not really been using his offloading shoe as much she does have diabetic shoes and he tells me that is primarily what has been wearing. 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 He also has high blood pressure. 01-25-2022 upon evaluation today patient's wound actually showing signs of improvement though there is still little bit of callus and some biofilm and slough buildup on the surface of the wound. I Minna perform sharp debridement to clear this away today. He tells me has been using the shoe a lot of the time though sometimes when he is at the assisted living facility he just walks around with a sock not using the shoe. I explained that he needs to make sure to always have the shoe on when he is up and moving around as can help this to heal most effectively. 02-01-2022 upon evaluation today patient appears to be doing well currently in regard to his wound from the  standpoint of this not getting worse it does not look to be  infected. With that being said unfortunately he does have an open wound that is going to require some sharp debridement today and again I am concerned that if we do not get this closed he is going to end up with this worsening in general. He has not really been wearing the postop shoe with front offloading unfortunately which means that he is not really getting much better. I think that a total contact cast is probably going to be the method in which to try to get this healed as quickly as possible. Again the sooner we get healed lessen the chance that this will develop into a more significant ulceration requiring any type of surgery. We especially would avoid any chance for amputation. 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 Here for application of TCC #1 Electronic Signature(s) Signed: 02/06/2022 5:01:28 PM By: Baltazar Najjarobson, Faven Watterson MD Entered By: Baltazar Najjarobson, Breshay Ilg on 02/06/2022 09:26:37 -------------------------------------------------------------------------------- Physical Exam Details Patient Name: Date of Service: Robert Levine, Robert Levine 02/06/2022 8:45 A Levine Medical Record Number: [id number] Patient Account Number: [id number] Date of Birth/Sex: Treating RN: 05-26-1955 (66 y.o. Arthur HolmsM) Woody, [name] Primary Care Provider: SYSTEM, PCP Other Clinician: Referring Provider: Treating Provider/Extender: RO BSO N, MICHA EL Calvert CantorG Baker, [name] in Treatment: 3 Constitutional Patient is hypertensive.. Pulse regular and within target range for patient.Marland Kitchen. Respirations regular, non-labored and within target range.. Temperature is normal and within the target range for the patient.Marland Kitchen. appears in no distress. Notes Wound exam; small wound over the right first metatarsal head raised edges with callus and thick skin and some debris on the surface. I debrided the callus skin from around the wound edges and the debris on the surface this removed what ever  undermining was present. Hemostasis with direct pressure. Postdebridement the patient's wound was redressed with Kandis MannanHydrofera Blue and then a TCC applied Electronic Signature(s) Signed: 02/06/2022 5:01:28 PM By: Baltazar Najjarobson, Starlee Corralejo MD Entered By: Baltazar Najjarobson, Imogen Maddalena on 02/06/2022 13:24:4009:28:54 Robert Levine, Robert Levine ([id number]) [id number] Page 3 of 6 -------------------------------------------------------------------------------- Problem List Details Patient Name: Date of Service: Robert FantasiaBURNHA [facility] 02/06/20[facility] 8:45 A Levine Medical Record Number: [id number] Patient Account Number: [id number] Date of Birth/Sex: Treating RN: 05-26-1955 (66 y.o. Arthur HolmsM) Woody, [name] Primary Care Provider: SYSTEM, PCP Other Clinician: Referring Provider: Treating Provider/Extender: Chauncey MannO BSO N, MICHA Adolphus BirchwoodEL G Baker, [name] in Treatment: 3 Active Problems ICD-10 Encounter Code Description Active Date MDM Diagnosis E11.621 Type 2 diabetes mellitus with foot ulcer 01/15/2022 No Yes L97.512 Non-pressure chronic ulcer of other part of right foot with fat layer exposed 01/15/2022 No Yes Z89.411 Acquired absence of right great toe 01/15/2022 No Yes N18.30 Chronic kidney disease, stage 3 unspecified 01/15/2022 No Yes I10 Essential (primary) hypertension 01/15/2022 No Yes Inactive Problems Resolved Problems Electronic Signature(s) Signed: 02/06/2022 5:01:28 PM By: Baltazar Najjarobson, Agnes Probert MD Entered By: Baltazar Najjarobson, Auburn Hester on 02/06/2022 09:25:58 -------------------------------------------------------------------------------- Progress Note Details Patient Name: Date of Service: Robert Levine, Robert Levine 02/06/2022 8:45 A Levine Medical Record Number: [id number] Patient Account Number: [id number] Date of Birth/Sex: Treating RN: 05-26-1955 [age] y.o. Arthur HolmsM) Woody, [name] Primary Care Provider: SYSTEM, PCP Other Clinician: Roxy Levine, Robert Levine ([id number]) [id number] Page 4 of 6 Referring Provider: Treating Provider/Extender: RO BSO  N, MICHA EL Calvert CantorG Baker, [name] in Treatment: 3 Subjective History of Present Illness (HPI) 01-15-2022 patient presents today as a referral from Dr. Rosetta PosnerAndrew Baker who is a local podiatrist due to a foot wound that is not getting better. The patient unfortunately has had this going on for quite some time he is  not necessarily the best historian which makes it a little bit difficult but nonetheless I did review notes as well together where things stand what is going on right now has been using Vaseline over the area according to what he tells me he has had debridements with Dr. Excell Seltzer intermittently. With that being said the patient has not really been using his offloading shoe as much she does have diabetic shoes and he tells me that is primarily what has been wearing. 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 He also has high blood pressure. 01-25-2022 upon evaluation today patient's wound actually showing signs of improvement though there is still little bit of callus and some biofilm and slough buildup on the surface of the wound. I Minna perform sharp debridement to clear this away today. He tells me has been using the shoe a lot of the time though sometimes when he is at the assisted living facility he just walks around with a sock not using the shoe. I explained that he needs to make sure to always have the shoe on when he is up and moving around as can help this to heal most effectively. 02-01-2022 upon evaluation today patient appears to be doing well currently in regard to his wound from the standpoint of this not getting worse it does not look to be infected. With that being said unfortunately he does have an open wound that is going to require some sharp debridement today and again I am concerned that if we do not get this closed he is  going to end up with this worsening in general. He has not really been wearing the postop shoe with front offloading unfortunately which means that he is not really getting much better. I think that a total contact cast is probably going to be the method in which to try to get this healed as quickly as possible. Again the sooner we get healed lessen the chance that this will develop into a more significant ulceration requiring any type of surgery. We especially would avoid any chance for amputation. 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 Here for application of TCC #1 Objective Constitutional Patient is hypertensive.. Pulse regular and within target range for patient.Marland Kitchen Respirations regular, non-labored and within target range.. Temperature is normal and within the target range for the patient.Marland Kitchen appears in no distress. Vitals Time Taken: 8:55 AM, Height: 76 in, Weight: 267 lbs, BMI: 32.5, Temperature: 98.0 F, Pulse: 94 bpm, Respiratory Rate: 18 breaths/min, Blood Pressure: 172/80 mmHg. General Notes: Wound exam; small wound over the right first metatarsal head raised edges with callus and thick skin and some debris on the surface. I debrided the callus skin from around the wound edges and the debris on the surface this removed what ever undermining was present. Hemostasis with direct pressure. Postdebridement the patient's wound was redressed with Hydrofera Blue and then a TCC applied Integumentary (Hair, Skin) Wound #1 status is Open. Original cause of wound was Gradually Appeared. The date acquired was: 04/02/2021. The wound has been in treatment 3 weeks. The wound is located on the Right Metatarsal head first. The wound measures 0.9cm length x 0.8cm width x 0.3cm depth; 0.565cm^2 area and 0.17cm^3 volume. There is Fat Layer (Subcutaneous Tissue) exposed. There is undermining starting at 1:00 and ending at 1:00 with a maximum distance of 0.2cm.  There is a medium amount of  serosanguineous drainage noted. There is no granulation within the wound bed. There is a large (67-100%) amount of necrotic tissue within the wound bed including Adherent Slough. 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 There was a Excisional Skin/Subcutaneous Tissue Debridement with a total area of 0.72 sq cm performed by Maxwell Caul, MD. With the following instrument(s): Curette to remove Viable and Non-Viable tissue/material. Material removed includes Callus, Subcutaneous Tissue, and Slough. No specimens were taken. A time out was conducted at 09:19, prior to the start of the procedure. A Minimum amount of bleeding was controlled with Pressure. The procedure was tolerated well. Post Debridement Measurements: 1cm length x 0.9cm width x 0.4cm depth; 0.283cm^3 volume. Robert Levine, Robert Levine ([id number]) [id number] Page 5 of 6 Character of Wound/Ulcer Post Debridement is stable. Severity of Tissue Post Debridement is: Fat layer exposed. Post procedure Diagnosis Wound #1: Same as Pre-Procedure Pre-procedure diagnosis of Wound #1 is a Diabetic Wound/Ulcer of the Lower Extremity located on the Right Metatarsal head first . There was a T Contact otal Cast Procedure by Maxwell Caul, MD. Post procedure Diagnosis Wound #1: Same as Pre-Procedure Plan 1. Wound debridement as noted. 2. Hydrofera Blue and TCC #1 3. Patient will return in 3 days time for the obligatory first total contact cast change Electronic Signature(s) Signed: 02/06/2022  5:01:28 PM By: Baltazar Najjar MD Entered By: Baltazar Najjar on 02/06/2022 09:29:45 -------------------------------------------------------------------------------- Total Contact Cast Details Patient Name: Date of Service: Robert Levine, Robert [facility] 02/06/2022 8:45 A Levine Medical Record Number: [id number] Patient Account Number: [id number] Date of Birth/Sex: Treating RN: Jun 07, 1955 (66 y.o. [name] Primary Care Provider: SYSTEM, PCP Other Clinician: Referring Provider: Treating Provider/Extender: RO BSO N, MICHA EL [name] in Treatment: 3 T Contact Cast Applied for Wound Assessment: otal Wound #1 Right Metatarsal head first Performed By: Physician Maxwell Caul, MD Post Procedure Diagnosis Same as Pre-procedure Electronic Signature(s) Signed: 02/06/2022 5:01:28 PM By: Baltazar Najjar MD Entered By: Baltazar Najjar on 02/06/2022 09:30:06 -------------------------------------------------------------------------------- SuperBill Details Patient Name: Date of Service: Robert Levine, Robert [facility] 02/06/2022 Medical Record Number: [id number] Patient Account Number: [id number] Date of Birth/Sex: Treating RN: 07-21-1955 (65 y.o. [name] Primary Care Provider: SYSTEM, PCP Other Clinician: Roxy Levine ([id number]) [id number] Page 6 of 6 Referring Provider: Treating Provider/Extender: RO BSO N, MICHA EL [name] in Treatment: 3 Diagnosis Coding ICD-10 Codes Code Description E11.621 Type 2 diabetes mellitus with foot ulcer L97.512 Non-pressure chronic ulcer of other part of right foot with fat layer exposed Z89.411 Acquired absence of right great toe N18.30 Chronic kidney disease, stage 3 unspecified I10 Essential (primary) hypertension Facility Procedures : CPT4 Code: [id number] Description: 11042 - DEB SUBQ TISSUE 20 SQ CM/< ICD-10 Diagnosis Description L97.512 Non-pressure chronic ulcer of other part of right foot with fat layer  exposed E11.621 Type 2 diabetes mellitus with foot ulcer Modifier: Quantity: 1 Physician Procedures : CPT4 Code Description Modifier [id number] 11042 - WC PHYS SUBQ TISS 20 SQ CM ICD-10 Diagnosis Description L97.512 Non-pressure chronic ulcer of other part of right foot with fat layer  exposed E11.621 Type 2 diabetes mellitus with foot ulcer Quantity: 1 Electronic Signature(s) Signed: 02/06/2022 5:01:28 PM By: Baltazar Najjar MD Entered By: Baltazar Najjar on 02/06/2022 09:30:30

## 2022-02-07 NOTE — Progress Notes (Signed)
DEVIAN, BARTOLOMEI ([id number]) [id number] Page 1 of 8 Visit Report for 02/01/2022 Arrival Information Details Patient Name: Date of Service: Robert Levine [facility] 02/01/2022 1:30 PM Medical Record Number: [id number] Patient Account Number: [id number] Date of Birth/Sex: Treating RN: [birth year] (66 y.o. Robert Levine Primary Care Jerel Sardina: SYSTEM, PCP Other Clinician: Betha Loa Referring Lorenza Winkleman: Treating Madason Rauls/Extender: Silvestre Moment in Treatment: 2 Visit Information History Since Last Visit All ordered tests and consults were completed: No Patient Arrived: Ambulatory Added or deleted any medications: No Arrival Time: 13:35 Any new allergies or adverse reactions: No Transfer Assistance: None Had a fall or experienced change in No Patient Requires Transmission-Based Precautions: No activities of daily living that may affect Patient Has Alerts: Yes risk of falls: Patient Alerts: DIABETIC Hospitalized since last visit: No NON COMPRESSABLE Pain Present Now: No Electronic Signature(s) Signed: 02/06/2022 5:15:48 PM By: Betha Loa Entered By: Betha Loa on 02/01/2022 13:35:58 -------------------------------------------------------------------------------- Clinic Level of Care Assessment Details Patient Name: Date of Service: Robert Levine [facility] 02/01/2022 1:30 PM Medical Record Number: [id number] Patient Account Number: [id number] Date of Birth/Sex: Treating RN: Robert Levine (66 y.o. Robert Levine Primary Care Marciana Uplinger: SYSTEM, PCP Other Clinician: Betha Loa Referring Yorel Redder: Treating Jayland Null/Extender: Silvestre Moment in Treatment: 2 Clinic Level of Care Assessment Items TOOL 1 Quantity Score []  - 0 Use when EandM and Procedure is performed on INITIAL visit ASSESSMENTS - Nursing Assessment / Reassessment []  - 0 General Physical Exam (combine w/ comprehensive assessment (listed just below) when performed  on new pt. evals) []  - 0 Comprehensive Assessment (HX, ROS, Risk Assessments, Wounds Hx, etc.) ASSESSMENTS - Wound and Skin Assessment / Reassessment []  - 0 Dermatologic / Skin Assessment (not related to wound area) ASSESSMENTS - Ostomy and/or Continence Assessment and Care ARHAN, Robert ( ) [id number] Page 2 of 8 []  - 0 Incontinence Assessment and Management []  - 0 Ostomy Care Assessment and Management (repouching, etc.) PROCESS - Coordination of Care []  - 0 Simple Patient / Family Education for ongoing care []  - 0 Complex (extensive) Patient / Family Education for ongoing care []  - 0 Staff obtains , Records, T Results / Process Orders est []  - 0 Staff telephones HHA, Nursing Homes / Clarify orders / etc []  - 0 Routine Transfer to another Facility (non-emergent condition) []  - 0 Routine Hospital Admission (non-emergent condition) []  - 0 New Admissions / / Ordering NPWT Apligraf, etc. , []  - 0 Emergency Hospital Admission (emergent condition) PROCESS - Special Needs []  - 0 Pediatric / Minor Patient Management []  - 0 Isolation Patient Management []  - 0 Hearing / Language / Visual special needs []  - 0 Assessment of Community assistance (transportation, D/C planning, etc.) []  - 0 Additional assistance / Altered mentation []  - 0 Support Surface(s) Assessment (bed, cushion, seat, etc.) INTERVENTIONS - Miscellaneous []  - 0 External ear exam []  - 0 Patient Transfer (multiple staff / [name] / Similar devices) []  - 0 Simple Staple / Suture removal (25 or less) []  - 0 Complex Staple / Suture removal (26 or more) []  - 0 Hypo/Hyperglycemic Management (do not check if billed separately) []  - 0 Ankle / Brachial Index (ABI) - do not check if billed separately Has the patient been seen at the hospital within the last three years: Yes Total Score: 0 Level Of Care: ____ Electronic Signature(s) Signed:  02/06/2022 5:15:48 PM By: Entered By: on 02/01/2022 14:26:25 -------------------------------------------------------------------------------- Lower Extremity Assessment Details Patient Name: Date  of Service: Robert Levine [facility] 02/01/2022 1:30 PM Medical Record Number: [id number] Patient Account Number: [id number] Date of Birth/Sex: Treating RN: [birth year] (66 y.o. Robert Levine Primary Care Tennelle Taflinger: SYSTEM, PCP Other Clinician: Betha Loa Referring Bern Fare: Treating Elliett Guarisco/Extender: Silvestre Moment in Treatment: 2 Edema Assessment B[Left: Robert Levine ([id number])] Robert Levine: [id number] Page 3 of 8] Assessed: [Left: No] [Right: Yes] Edema: [Left: Ye] [Right: s] Calf Left: Right: Point of Measurement: 42 cm From Medial Instep 42 cm Ankle Left: Right: Point of Measurement: 10 cm From Medial Instep 31 cm Vascular Assessment Pulses: Dorsalis Pedis Palpable: [Right:Yes] Posterior Tibial Palpable: [Right:Yes] Electronic Signature(s) Signed: 02/01/2022 2:35:39 PM By: Robert Levine, [name], RN, CWS, [name], [name] Signed: 02/06/2022 5:15:48 PM By: Betha Loa Entered By: Betha Loa on 02/01/2022 13:45:05 -------------------------------------------------------------------------------- Multi Wound Chart Details Patient Name: Date of Service: Robert Levine, Robert Levine 02/01/2022 1:30 PM Medical Record Number: [id number] Patient Account Number: [id number] Date of Birth/Sex: Treating RN: [birth year] (66 y.o. Robert Levine Primary Care Kairav Russomanno: SYSTEM, PCP Other Clinician: Betha Loa Referring Rexanne Inocencio: Treating Ahmaad Neidhardt/Extender: Silvestre Moment in Treatment: 2 Vital Signs Height(in): 76 Pulse(bpm): 73 Weight(lbs): 267 Blood Pressure(mmHg): 157/83 Body Mass Index(BMI): 32.5 Temperature(F): 98.2 Respiratory Rate(breaths/min): 18 [1:Photos:] [N/A:N/A] Right Metatarsal head first N/A N/A Wound  Location: Gradually Appeared N/A N/A Wounding Event: Diabetic Wound/Ulcer of the Lower N/A N/A Primary Etiology: Extremity Hypertension, Type II Diabetes N/A N/A Comorbid History: 04/02/2021 N/A N/A Date Acquired: 2 N/A N/A Weeks of Treatment: Open N/A N/A Wound Status: No N/A N/A Wound RecurrenceMUSA, REWERTS ([id number]) [id number] Page 4 of 8 0.8x0.7x0.2 N/A N/A Measurements L x W x D (cm) 0.44 N/A N/A A (cm) : rea 0.088 N/A N/A Volume (cm) : 30.80% N/A N/A % Reduction in A rea: 53.90% N/A N/A % Reduction in Volume: Grade 1 N/A N/A Classification: Medium N/A N/A Exudate A mount: Serosanguineous N/A N/A Exudate Type: red, brown N/A N/A Exudate Color: Large (67-100%) N/A N/A Granulation A mount: Red N/A N/A Granulation Quality: Small (1-33%) N/A N/A Necrotic A mount: Fat Layer (Subcutaneous Tissue): Yes N/A N/A Exposed Structures: Fascia: No Tendon: No Muscle: No Joint: No Bone: No None N/A N/A Epithelialization: Treatment Notes Electronic Signature(s) Signed: 02/06/2022 5:15:48 PM By: Betha Loa Entered By: Betha Loa on 02/01/2022 13:45:37 -------------------------------------------------------------------------------- Multi-Disciplinary Care Plan Details Patient Name: Date of Service: Robert Levine, Robert [facility] 02/01/2022 1:30 PM Medical Record Number: [id number] Patient Account Number: [id number] Date of Birth/Sex: Treating RN: [birth year] (66 y.o. Robert Levine Primary Care Clella Mckeel: SYSTEM, PCP Other Clinician: Betha Loa Referring Menashe Kafer: Treating Berlyn Malina/Extender: Silvestre Moment in Treatment: 2 Active Inactive Abuse / Safety / Falls / Self Care Management Nursing Diagnoses: Potential for injury related to falls Goals: Patient will remain injury free related to falls Date Initiated: 01/15/2022 Target Resolution Date: 02/15/2022 Goal Status: Active Interventions: Assess Activities of  Daily Living upon admission and as needed Assess fall risk on admission and as needed Assess: immobility, friction, shearing, incontinence upon admission and as needed Assess impairment of mobility on admission and as needed per policy Assess personal safety and home safety (as indicated) on admission and as needed Assess self care needs on admission and as needed Notes: Nutrition Nursing Diagnoses: Impaired glucose control: actual or potential DEVONTAY, CELAYA ([id number]) [id number] Page 5 of 8 Goals: Patient/caregiver will maintain therapeutic glucose control Date Initiated: 01/15/2022 Target Resolution Date: 03/17/2022 Goal Status: Active Interventions: Assess HgA1c results as ordered upon admission and as  needed Assess patient nutrition upon admission and as needed per policy Notes: Wound/Skin Impairment Nursing Diagnoses: Knowledge deficit related to ulceration/compromised skin integrity Goals: Patient/caregiver will verbalize understanding of skin care regimen Date Initiated: 01/15/2022 Target Resolution Date: 02/15/2022 Goal Status: Active Ulcer/skin breakdown will have a volume reduction of 30% by week 4 Date Initiated: 01/15/2022 Target Resolution Date: 03/17/2022 Goal Status: Active Ulcer/skin breakdown will have a volume reduction of 50% by week 8 Date Initiated: 01/15/2022 Target Resolution Date: 04/17/2022 Goal Status: Active Ulcer/skin breakdown will have a volume reduction of 80% by week 12 Date Initiated: 01/15/2022 Target Resolution Date: 05/18/2022 Goal Status: Active Ulcer/skin breakdown will heal within 14 weeks Date Initiated: 01/15/2022 Target Resolution Date: 06/16/2022 Goal Status: Active Interventions: Assess patient/caregiver ability to obtain necessary supplies Assess patient/caregiver ability to perform ulcer/skin care regimen upon admission and as needed Assess ulceration(s) every visit Notes: Electronic  Signature(s) Signed: 02/01/2022 2:35:39 PM By: Robert Levine, [name], RN, CWS, [name], [name] Signed: 02/06/2022 5:15:48 PM By: Betha Loa Entered By: Betha Loa on 02/01/2022 13:45:24 -------------------------------------------------------------------------------- Pain Assessment Details Patient Name: Date of Service: Robert Levine, Robert Levine 02/01/2022 1:30 PM Medical Record Number: [id number] Patient Account Number: [id number] Date of Birth/Sex: Treating RN: Lev[birth year] (66 y.o. Robert Levine Primary Care Julie-Ann Vanmaanen: SYSTEM, PCP Other Clinician: Betha Loa Referring Korvin Valentine: Treating Kasara Schomer/Extender: Silvestre Moment in Treatment: 2 Active Problems Location of Pain Severity and Description of Pain Patient Has Paino No Site Locations [place], [place] ([id number]) [id number] Page 6 of 8 Pain Management and Medication Current Pain Management: Electronic Signature(s) Signed: 02/01/2022 2:35:39 PM By: Robert Levine, [name], RN, CWS, [name], [name] Signed: 02/06/2022 5:15:48 PM By: Betha Loa Entered By: Betha Loa on 02/01/2022 13:38:28 -------------------------------------------------------------------------------- Patient/Caregiver Education Details Patient Name: Date of Service: Robert Levine, Robert [facility] 11/2/2023andnbsp1:30 PM Medical Record Number: [id number] Patient Account Number: [id number] Date of Birth/Gender: Treating RN: [birth year] (66 y.o. Robert Levine Primary Care Physician: SYSTEM, PCP Other Clinician: Betha Loa Referring Physician: Treating Physician/Extender: Silvestre Moment in Treatment: 2 Education Assessment Education Provided To: Patient Education Topics Provided Wound/Skin Impairment: Handouts: Other: continue wound care as directed Methods: Explain/Verbal Responses: State content correctly Electronic Signature(s) Signed: 02/06/2022 5:15:48 PM By: Betha Loa Entered By: Betha Loa on 02/01/2022  14:26:51 [name] ([id number]) [id number] Page 7 of 8 -------------------------------------------------------------------------------- Wound Assessment Details Patient Name: Date of Service: Robert Levine [facility] 02/01/2022 1:30 PM Medical Record Number: [id number] Patient Account Number: [id number] Date of Birth/Sex: Treating RN: [birth year] Levine (66 y.o. [name], [name] Primary Care Kataya Guimont: SYSTEM, PCP Other Clinician: Betha Loa Referring Camielle Sizer: Treating Akela Pocius/Extender: Silvestre Moment in Treatment: 2 Wound Status Wound Number: 1 Primary Etiology: Diabetic Wound/Ulcer of the Lower Extremity Wound Location: Right Metatarsal head first Wound Status: Open Wounding Event: Gradually Appeared Comorbid History: Hypertension, Type II Diabetes Date Acquired: 04/02/2021 Weeks Of Treatment: 2 Clustered Wound: No Photos Wound Measurements Length: (cm) 0.8 Width: (cm) 0.7 Depth: (cm) 0.2 Area: (cm) 0.44 Volume: (cm) 0.088 % Reduction in Area: 30.8% % Reduction in Volume: 53.9% Epithelialization: None Tunneling: No Undermining: No Wound Description Classification: Grade 1 Exudate Amount: Medium Exudate Type: Serosanguineous Exudate Color: red, brown Foul Odor After Cleansing: No Slough/Fibrino Yes Wound Bed Granulation Amount: Large (67-100%) Exposed Structure Granulation Quality: Red Fascia Exposed: No Necrotic Amount: Small (1-33%) Fat Layer (Subcutaneous Tissue) Exposed: Yes Necrotic Quality: Adherent Slough Tendon Exposed: No Muscle Exposed: No Joint Exposed: No Bone Exposed: No Electronic Signature(s) Signed: 02/01/2022 2:35:39 PM By:  Robert Levine, [name], RN, [employer], Radio producer, [name] Signed: 02/06/2022 5:15:48 PM By: Betha Loa Entered By: Betha Loa on 02/01/2022 13:43:47 [name] ([id number]) [id number] Page 8 of  8 -------------------------------------------------------------------------------- Vitals Details Patient Name: Date of Service: Robert Levine [facility] 02/01/2022 1:30 PM Medical Record Number: [id number] Patient Account Number: [id number] Date of Birth/Sex: Treating RN: [birth year]ine (66 y.o. Robert Levine Primary Care Muriel Hannold: SYSTEM, PCP Other Clinician: Betha Loa Referring Stormie Ventola: Treating Stevie Charter/Extender: Silvestre Moment in Treatment: 2 Vital Signs Time Taken: 13:38 Temperature (F): 98.2 Height (in): 76 Pulse (bpm): 73 Weight (lbs): 267 Respiratory Rate (breaths/min): 18 Body Mass Index (BMI): 32.5 Blood Pressure (mmHg): 157/83 Reference Range: 80 - 120 mg / dl Electronic Signature(s) Signed: 02/06/2022 5:15:48 PM By: Betha Loa Entered By: Betha Loa on 02/01/2022 13:38:14

## 2022-02-08 ENCOUNTER — Encounter: Payer: Medicare Other | Admitting: Internal Medicine

## 2022-02-08 DIAGNOSIS — E11621 Type 2 diabetes mellitus with foot ulcer: Secondary | ICD-10-CM | POA: Diagnosis not present

## 2022-02-13 NOTE — Progress Notes (Signed)
UEL, DAVIDOW ([id number]) [id number] Page 1 of 6 Visit Report for 02/08/2022 HPI Details Patient Name: Date of Service: Robert Levine [facility] 02/08/2022 1:30 PM Medical Record Number: [id number] Patient Account Number: [id number] Date of Birth/Sex: Treating RN: November 07, 1955 (66 y.o. [name] Primary Care Provider: SYSTEM, PCP Other Clinician: Betha Loa Referring Provider: Treating Provider/Extender: Chauncey Mann, MICHA EL [name] in Treatment: 3 History of Present Illness HPI Description: 01-15-2022 patient presents today as a referral from Dr. Rosetta Posner who is a local podiatrist due to a foot wound that is not getting better. The patient unfortunately has had this going on for quite some time he is not necessarily the best historian which makes it a little bit difficult but nonetheless I did review notes as well together where things stand what is going on right now has been using Vaseline over the area according to what he tells me he has had debridements with Dr. Excell Seltzer intermittently. With that being said the patient has not really been using his offloading shoe as much she does have diabetic shoes and he tells me that is primarily what has been wearing. 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 He also has high blood pressure. 01-25-2022 upon evaluation today patient's wound actually showing signs of improvement though there is still little bit of callus and some biofilm and slough buildup on the surface of the wound. I Minna perform sharp debridement to clear this away today. He tells me has been using the shoe a lot of the time though sometimes when he is at the assisted living facility he just walks around with a sock not using the shoe. I explained that he needs to make sure to  always have the shoe on when he is up and moving around as can help this to heal most effectively. 02-01-2022 upon evaluation today patient appears to be doing well currently in regard to his wound from the standpoint of this not getting worse it does not look to be infected. With that being said unfortunately he does have an open wound that is going to require some sharp debridement today and again I am concerned that if we do not get this closed he is going to end up with this worsening in general. He has not really been wearing the postop shoe with front offloading unfortunately which means that he is not really getting much better. I think that a total contact cast is probably going to be the method in which to try to get this healed as quickly as possible. Again the sooner we get healed lessen the chance that this will develop into a more significant ulceration requiring any type of surgery. We especially would avoid any chance for amputation. 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 Here for application of TCC #1 11/9; obligatory first total contact cast change. The patient had no particular problems. TCC reapplied Electronic Signature(s) Signed: 02/08/2022 3:56:39 PM By: Baltazar Najjar MD Entered By: Baltazar Najjar on 02/08/2022 14:44:20 -------------------------------------------------------------------------------- Physical Exam Details Patient Name: Date of Service: Robert Levine, [name] 02/08/2022 1:30 PM Medical Record Number: [id number] Patient Account Number: [id number] Date of Birth/Sex: Treating RN: 23-Oct-1955 (66 y.o. [name] Primary Care Provider: SYSTEM, PCP Other Clinician: Betha Loa Referring Provider: Treating Provider/Extender: RO BSO Dorris Carnes, MICHA EL Horton Marshall, Rockwell [place]  in Treatment: 3 KARTER, HELLMER ([id number]) [id number] Page 2 of 6 Constitutional Sitting or standing Blood Pressure is within target range for patient..  Pulse regular and within target range for patient.Marland Kitchen Respirations regular, non-labored and within target range.. Temperature is normal and within the target range for the patient.Marland Kitchen appears in no distress. Notes Wound exam; the wound was not examined but per our intake nurse look better. TCC reapplied in the standard fashion Electronic Signature(s) Signed: 02/08/2022 3:56:39 PM By: Baltazar Najjar MD Entered By: Baltazar Najjar on 02/08/2022 14:47:37 -------------------------------------------------------------------------------- Physician Orders Details Patient Name: Date of Service: Robert Levine, [name] 02/08/2022 1:30 PM Medical Record Number: [id number] Patient Account Number: [id number] Date of Birth/Sex: Treating RN: [birth year] (66 y.o. [name] Primary Care Provider: SYSTEM, PCP Other Clinician: Betha Loa Referring Provider: Treating Provider/Extender: RO BSO Dorris Carnes, MICHA EL [name] in Treatment: 3 Verbal / Phone Orders: No Diagnosis Coding Follow-up Appointments Return Appointment in 1 week. Home Health [facility] for wound care. May utilize formulary equivalent dressing for wound treatment orders unless otherwise specified. Home Health Nurse may visit PRN to address patients wound care needs. - [facility] [phone] Bathing/ Shower/ Hygiene Clean wound with Normal Saline or wound cleanser. May shower with wound dressing protected with water repellent cover or cast protector. No tub bath. Anesthetic (Use 'Patient Medications' Section for Anesthetic Order Entry) Lidocaine applied to wound bed Edema Control - Lymphedema / Segmental Compressive Device / Other Elevate, Exercise Daily and A void Standing for Long Periods of Time. Elevate legs to the level of the heart and pump ankles as often as possible Elevate leg(s) parallel to the floor when sitting. Off-Loading Total Contact Cast to Right Lower Extremity - TCC # 2 appiled Other: - front offloading  shoe Wound Treatment Wound #1 - Metatarsal head first Wound Laterality: Right Prim Dressing: Hydrofera Blue Ready Transfer Foam, 2.5x2.5 (in/in) 3 x Per Week ary Discharge Instructions: Apply Hydrofera Blue Ready to wound bed as directed Secondary Dressing: Coverlet Latex-Free Fabric Adhesive Dressings 3 x Per Week Discharge Instructions: 1.5 x 2 Electronic Signature(s) Signed: 02/08/2022 3:56:39 PM By: Baltazar Najjar MD Signed: 02/13/2022 7:52:55 AM By: Betha Loa Entered By: Betha Loa on 02/08/2022 14:12:04 [name] ([id number]) [id number] Page 3 of 6 -------------------------------------------------------------------------------- Problem List Details Patient Name: Date of Service: Robert Levine [facility] 02/08/2022 1:30 PM Medical Record Number: [id number] Patient Account Number: [id number] Date of Birth/Sex: Treating RN: Jun 03, 1955 (66 y.o. [name] Primary Care Provider: SYSTEM, PCP Other Clinician: Betha Loa Referring Provider: Treating Provider/Extender: Chauncey Mann, MICHA EL [name] in Treatment: 3 Active Problems ICD-10 Encounter Code Description Active Date MDM Diagnosis E11.621 Type 2 diabetes mellitus with foot ulcer 01/15/2022 No Yes L97.512 Non-pressure chronic ulcer of other part of right foot with fat layer exposed 01/15/2022 No Yes Z89.411 Acquired absence of right great toe 01/15/2022 No Yes N18.30 Chronic kidney disease, stage 3 unspecified 01/15/2022 No Yes I10 Essential (primary) hypertension 01/15/2022 No Yes Inactive Problems Resolved Problems Electronic Signature(s) Signed: 02/08/2022 3:56:39 PM By: Baltazar Najjar MD Entered By: Baltazar Najjar on 02/08/2022 14:43:28 -------------------------------------------------------------------------------- Progress Note Details Patient Name: Date of Service: Robert Levine, [name] 02/08/2022 1:30 PM [name] ([id number])  [id number] Page 4 of 6 Medical Record Number: [id number] Patient Account Number: [id number] Date of Birth/Sex: Treating RN: 01-05-1956 (66 y.o. [name] Primary Care Provider: SYSTEM, PCP Other Clinician: Betha Loa Referring Provider: Treating Provider/Extender: RO BSO N, MICHA EL Horton Marshall,  [place] in Treatment: 3 Subjective History of Present Illness (HPI) 01-15-2022 patient presents today as a referral from Dr. Rosetta Posner who is a local podiatrist due to a foot wound that is not getting better. The patient unfortunately has had this going on for quite some time he is not necessarily the best historian which makes it a little bit difficult but nonetheless I did review notes as well together where things stand what is going on right now has been using Vaseline over the area according to what he tells me he has had debridements with Dr. Excell Seltzer intermittently. With that being said the patient has not really been using his offloading shoe as much she does have diabetic shoes and he tells me that is primarily what has been wearing. 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 He also has high blood pressure. 01-25-2022 upon evaluation today patient's wound actually showing signs of improvement though there is still little bit of callus and some biofilm and slough buildup on the surface of the wound. I Minna perform sharp debridement to clear this away today. He tells me has been using the shoe a lot of the time though sometimes when he is at the assisted living facility he just walks around with a sock not using the shoe. I explained that he needs to make sure to always have the shoe on when he is up and moving around as can help this to heal most effectively. 02-01-2022 upon evaluation today patient  appears to be doing well currently in regard to his wound from the standpoint of this not getting worse it does not look to be infected. With that being said unfortunately he does have an open wound that is going to require some sharp debridement today and again I am concerned that if we do not get this closed he is going to end up with this worsening in general. He has not really been wearing the postop shoe with front offloading unfortunately which means that he is not really getting much better. I think that a total contact cast is probably going to be the method in which to try to get this healed as quickly as possible. Again the sooner we get healed lessen the chance that this will develop into a more significant ulceration requiring any type of surgery. We especially would avoid any chance for amputation. 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 Here for application of TCC #1 11/9; obligatory first total contact cast change. The patient had no particular problems. TCC reapplied Objective Constitutional Sitting or standing Blood Pressure is within target range for patient.. Pulse regular and within target range for patient.Marland Kitchen Respirations regular, non-labored and within target range.. Temperature is normal and within the target range for the patient.Marland Kitchen appears in no distress. Vitals Time Taken: 1:53 PM, Height: 76 in, Weight: 267 lbs, BMI: 32.5, Temperature: 98.3 F, Pulse: 96 bpm, Respiratory Rate: 16 breaths/min, Blood Pressure: 130/76 mmHg. General Notes: Wound exam; the wound was not examined but per our intake nurse look better. TCC reapplied in the standard fashion Integumentary (Hair, Skin) Wound #1 status is Open. Original cause of wound was Gradually Appeared. The date acquired was: 04/02/2021. The wound has been in treatment 3 weeks. The wound is located on the Right Metatarsal head first. The wound measures 0.4cm length x 0.4cm width x  0.2cm depth; 0.126cm^2 area and 0.025cm^3  volume. There is Fat Layer (Subcutaneous Tissue) exposed. There is no tunneling or undermining noted. There is a medium amount of serosanguineous drainage noted. There is no granulation within the wound bed. There is a large (67-100%) amount of necrotic tissue within the wound bed including Adherent Slough. 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 There was a T Contact otal Cast Procedure by Maxwell Caul, MD. Post procedure Diagnosis Wound #1: Same as Pre-Procedure [name] ([id number]) [id number] Page 5 of 6 Notes: TCC #2 applied. Plan Follow-up Appointments: Return Appointment in 1 week. Home Health: [facility] for wound care. May utilize formulary equivalent dressing for wound treatment orders unless otherwise specified. Home Health Nurse may visit PRN to address patientoos wound care needs. - [facility] [phone] Bathing/ Shower/ Hygiene: Clean wound with Normal Saline or wound cleanser. May shower with wound dressing protected with water repellent cover or cast protector. No tub bath. Anesthetic (Use 'Patient Medications' Section for Anesthetic Order Entry): Lidocaine applied to wound bed Edema Control - Lymphedema / Segmental Compressive Device / Other: Elevate, Exercise Daily and Avoid Standing for Long Periods of Time. Elevate legs to the level of the heart and pump ankles as often as possible Elevate leg(s) parallel to the floor when sitting. Off-Loading: T Contact Cast to Right Lower Extremity - TCC # 2 appiled otal Other: - front offloading  shoe WOUND #1: - Metatarsal head first Wound Laterality: Right Prim Dressing: Hydrofera Blue Ready Transfer Foam, 2.5x2.5 (in/in) 3 x Per Week/ ary Discharge Instructions: Apply Hydrofera Blue Ready to wound bed as directed Secondary Dressing: Coverlet Latex-Free Fabric Adhesive Dressings 3 x Per Week/ Discharge Instructions: 1.5 x 2 TCC reapplied Electronic Signature(s) Signed: 02/08/2022 3:56:39 PM By: Baltazar Najjar MD Entered By: Baltazar Najjar on 02/08/2022 14:48:01 -------------------------------------------------------------------------------- Total Contact Cast Details Patient Name: Date of Service: Robert Levine, [name] [facility] 02/08/2022 1:30 PM Medical Record Number: [id number] Patient Account Number: [id number] Date of Birth/Sex: Treating RN: 02-May-1955 (66 y.o. [name] Primary Care Provider: SYSTEM, PCP Other Clinician: Betha Loa Referring Provider: Treating Provider/Extender: Chauncey Mann, MICHA EL [name] in Treatment: 3 T Contact Cast Applied for Wound Assessment: otal Wound #1 Right Metatarsal head first Performed By: Physician Maxwell Caul, MD Post Procedure Diagnosis Same as Pre-procedure Notes TCC #2 applied Electronic Signature(s) Signed: 02/08/2022 3:56:39 PM By: Baltazar Najjar MD Signed: 02/13/2022 7:52:55 AM By: Betha Loa Entered By: Betha Loa on 02/08/2022 14:10:16 [name] ([id number]) [id number] Page 6 of 6 -------------------------------------------------------------------------------- SuperBill Details Patient Name: Date of Service: Robert Levine [facility] 02/08/2022 Medical Record Number: [id number] Patient Account Number: [id number] Date of Birth/Sex: Treating RN: June 04, 1955 (66 y.o. [name] Primary Care Provider: SYSTEM, PCP Other Clinician: Betha Loa Referring Provider: Treating Provider/Extender: Chauncey Mann, MICHA EL [name] in Treatment: 3 Diagnosis  Coding ICD-10 Codes Code Description E11.621 Type 2 diabetes mellitus with foot ulcer L97.512 Non-pressure chronic ulcer of other part of right foot with fat layer exposed Z89.411 Acquired absence of right great toe N18.30 Chronic kidney disease, stage 3 unspecified I10 Essential (primary) hypertension Facility Procedures : CPT4  Code: [id number] Description: 29445 - APPLY TOTAL CONTACT LEG CAST ICD-10 Diagnosis Description L97.512 Non-pressure chronic ulcer of other part of right foot with fat layer exposed Modifier: Quantity: 1 Physician Procedures : CPT4 Code Description Modifier [id number] 29445 - WC PHYS APPLY TOTAL CONTACT CAST ICD-10 Diagnosis Description L97.512 Non-pressure chronic ulcer of other part of right foot with fat layer exposed Quantity: 1 Electronic Signature(s) Signed: 02/08/2022 3:56:39 PM By: Baltazar Najjarobson, Myran Arcia MD Entered By: Baltazar Najjarobson, Riah Kehoe on 02/08/2022 14:48:22

## 2022-02-13 NOTE — Progress Notes (Signed)
Robert, Levine ([id number]) [id number] Page 1 of 8 Visit Report for 02/08/2022 Arrival Information Details Patient Name: Date of Service: Robert Levine [facility] 02/08/2022 1:30 PM Medical Record Number: [id number] Patient Account Number: [id number] Date of Birth/Sex: Treating RN: 05/05/1955 (66 y.o. Robert Levine, Robert Levine Primary Care Thatiana Renbarger: Levine, PCP Other Clinician: Betha Loa Referring Robert Levine: Treating Robert Levine/Extender: Robert Levine, Robert Levine in Treatment: 3 Visit Information History Since Last Visit All ordered tests and consults were completed: No Patient Arrived: Robert Levine Added or deleted any medications: No Arrival Time: 13:44 Any new allergies or adverse reactions: No Transfer Assistance: None Had a fall or experienced change in No Patient Requires Transmission-Based Precautions: No activities of daily living that may affect Patient Has Alerts: Yes risk of falls: Patient Alerts: DIABETIC Signs or symptoms of abuse/neglect since last visito No NON COMPRESSABLE Hospitalized since last visit: No Implantable device outside of the clinic excluding No cellular tissue based products placed in the center since last visit: Pain Present Now: No Electronic Signature(s) Signed: 02/13/2022 7:52:55 AM By: Betha Loa Entered By: Betha Loa on 02/08/2022 13:52:41 -------------------------------------------------------------------------------- Clinic Level of Care Assessment Details Patient Name: Date of Service: Robert Levine [facility] 02/08/2022 1:30 PM Medical Record Number: [id number] Patient Account Number: [id number] Date of Birth/Sex: Treating RN: 1955-10-31 (66 y.o. Robert Levine Primary Care Robert Levine: Levine, PCP Other Clinician: Betha Loa Referring Robert Levine: Treating Aryonna Gunnerson/Extender: Robert Levine, Robert Levine in Treatment: 3 Clinic Level of Care Assessment Items TOOL 1 Quantity Score []  - 0 Use when  EandM and Procedure is performed on INITIAL visit ASSESSMENTS - Nursing Assessment / Reassessment []  - 0 General Physical Exam (combine w/ comprehensive assessment (listed just below) when performed on new pt. evals) []  - 0 Comprehensive Assessment (HX, ROS, Risk Assessments, Wounds Hx, etc.) Robert, Levine ( ) [id number] Page 2 of 8 ASSESSMENTS - Wound and Skin Assessment / Reassessment []  - 0 Dermatologic / Skin Assessment (not related to wound area) ASSESSMENTS - Ostomy and/or Continence Assessment and Care []  - 0 Incontinence Assessment and Management []  - 0 Ostomy Care Assessment and Management (repouching, etc.) PROCESS - Coordination of Care []  - 0 Simple Patient / Family Education for ongoing care []  - 0 Complex (extensive) Patient / Family Education for ongoing care []  - 0 Staff obtains Robert Levine, Records, T Results / Process Orders est []  - 0 Staff telephones HHA, Nursing Homes / Clarify orders / etc []  - 0 Routine Transfer to another Facility (non-emergent condition) []  - 0 Routine Hospital Admission (non-emergent condition) []  - 0 New Admissions / [id number] / Ordering NPWT Apligraf, etc. , []  - 0 Emergency Hospital Admission (emergent condition) PROCESS - Special Needs []  - 0 Pediatric / Minor Patient Management []  - 0 Isolation Patient Management []  - 0 Hearing / Language / Visual special needs []  - 0 Assessment of Community assistance (transportation, D/C planning, etc.) []  - 0 Additional assistance / Altered mentation []  - 0 Support Surface(s) Assessment (bed, cushion, seat, etc.) INTERVENTIONS - Miscellaneous []  - 0 External ear exam []  - 0 Patient Transfer (multiple staff / 05-12-1994 / Similar devices) []  - 0 Simple Staple / Suture removal (25 or less) []  - 0 Complex Staple / Suture removal (26 or more) []  - 0 Hypo/Hyperglycemic Management (do not check if billed separately) []  - 0 Ankle /  Brachial Index (ABI) - do not check if billed separately Has the patient been seen at the hospital within  the last three years: Yes Total Score: 0 Level Of Care: ____ Electronic Signature(s) Signed: 02/13/2022 7:52:55 AM By: Betha LoaVenable, Robert Entered By: Betha LoaVenable, Robert on 02/08/2022 14:12:12 -------------------------------------------------------------------------------- Lower Extremity Assessment Details Patient Name: Date of Service: [facility] 1:30 PM Medical Record Number: [id number] Patient Account Number: [id number] Date of Birth/Sex: Treating RN: 1956-02-16 (66 y.o. Robert HolmsM) Robert Levine, Robert Levine Primary Care Robert Levine: Levine, PCP Other Clinician: Betha LoaVenable, Robert Referring Robert Levine: Treating Robert Levine/Extender: Robert MannO BSO N, Robert EL Calvert CantorG Baker, [name] in Treatment: 3 Robert CedarBURNHAM, Robert ([id number]) [id number] Page 3 of 8 Edema Assessment Assessed: [Left: No] [Right: Yes] Edema: [Left: Ye] [Right: s] Calf Left: Right: Point of Measurement: 42 cm From Medial Instep 42.5 cm Ankle Left: Right: Point of Measurement: 10 cm From Medial Instep 31.5 cm Vascular Assessment Pulses: Dorsalis Pedis Palpable: [Right:Yes] Electronic Signature(s) Signed: 02/08/2022 5:19:22 PM By: Elliot GurneyWoody, [name], RN, CWS, [name], [name] Signed: 02/13/2022 7:52:55 AM By: Betha LoaVenable, Robert Entered By: Betha LoaVenable, Robert on 02/08/2022 14:04:41 -------------------------------------------------------------------------------- Multi Wound Chart Details Patient Name: Date of Service: Robert Levine Levine, Robert Levine 02/08/2022 1:30 PM Medical Record Number: [id number] Patient Account Number: [id number] Date of Birth/Sex: Treating RN: 1956-02-16 38(65 y.o. Robert HolmsM) Robert Levine, Robert Levine Primary Care Rianna Lukes: Levine, PCP Other Clinician: Betha LoaVenable, Robert Referring Deven Furia: Treating Hildy Nicholl/Extender: Robert MannO BSO N, Robert EL Calvert CantorG Baker, [name] in Treatment: 3 Vital Signs Height(in): 76 Pulse(bpm): 96 Weight(lbs): 267 Blood  Pressure(mmHg): 130/76 Body Mass Index(BMI): 32.5 Temperature(F): 98.3 Respiratory Rate(breaths/min): 16 [1:Photos:] [N/A:N/A] Right Metatarsal head first N/A N/A Wound Location: Gradually Appeared N/A N/A Wounding Event: Diabetic Wound/Ulcer of the Lower N/A N/A Primary Etiology: Extremity Hypertension, Type II Diabetes N/A N/A Comorbid History: 04/02/2021 N/A N/A Date Acquired: 3 N/A N/A Weeks of Treatment: Open N/A N/A Wound StatusRoxy Levine: Demmon, Millie ([id number]) [id number] Page 4 of 8 No N/A N/A Wound Recurrence: 0.4x0.4x0.2 N/A N/A Measurements L x W x D (cm) 0.126 N/A N/A A (cm) : rea 0.025 N/A N/A Volume (cm) : 80.20% N/A N/A % Reduction in A rea: 86.90% N/A N/A % Reduction in Volume: Grade 1 N/A N/A Classification: Medium N/A N/A Exudate A mount: Serosanguineous N/A N/A Exudate Type: red, brown N/A N/A Exudate Color: None Present (0%) N/A N/A Granulation A mount: Large (67-100%) N/A N/A Necrotic A mount: Fat Layer (Subcutaneous Tissue): Yes N/A N/A Exposed Structures: Fascia: No Tendon: No Muscle: No Joint: No Bone: No None N/A N/A Epithelialization: Treatment Notes Electronic Signature(s) Signed: 02/13/2022 7:52:55 AM By: Betha LoaVenable, Robert Entered By: Betha LoaVenable, Robert on 02/08/2022 14:05:20 -------------------------------------------------------------------------------- Multi-Disciplinary Care Plan Details Patient Name: Date of Service: [facility] 1:30 PM Medical Record Number: [id number] Patient Account Number: [id number] Date of Birth/Sex: Treating RN: 1956-02-16 (66 y.o. Robert HolmsM) Robert Levine, Robert Levine Primary Care Judeth Gilles: Levine, PCP Other Clinician: Betha LoaVenable, Robert Referring Brendaliz Kuk: Treating Danali Marinos/Extender: Robert MannO BSO N, Robert EL Calvert CantorG Baker, [name] in Treatment: 3 Active Inactive Abuse / Safety / Falls / Self Care Management Nursing Diagnoses: Potential for injury related to falls Goals: Patient will  remain injury free related to falls Date Initiated: 01/15/2022 Target Resolution Date: 02/15/2022 Goal Status: Active Interventions: Assess Activities of Daily Living upon admission and as needed Assess fall risk on admission and as needed Assess: immobility, friction, shearing, incontinence upon admission and as needed Assess impairment of mobility on admission and as needed per policy Assess personal safety and home safety (as indicated) on admission and as needed Assess self care needs on admission and as needed Notes: Necrotic Tissue Nursing Diagnoses: Impaired tissue  integrity related to necrotic/devitalized tissue Robert Levine, Robert Levine ([id number]) [id number] Page 5 of 8 Knowledge deficit related to management of necrotic/devitalized tissue Goals: Necrotic/devitalized tissue will be minimized in the wound bed Date Initiated: 02/06/2022 Target Resolution Date: 02/06/2022 Goal Status: Active Patient/caregiver will verbalize understanding of reason and process for debridement of necrotic tissue Date Initiated: 02/06/2022 Target Resolution Date: 02/06/2022 Goal Status: Active Interventions: Assess patient pain level pre-, during and post procedure and prior to discharge Provide education on necrotic tissue and debridement process Treatment Activities: Apply topical anesthetic as ordered : 02/06/2022 Excisional debridement : 02/06/2022 Notes: Nutrition Nursing Diagnoses: Impaired glucose control: actual or potential Goals: Patient/caregiver will maintain therapeutic glucose control Date Initiated: 01/15/2022 Target Resolution Date: 03/17/2022 Goal Status: Active Interventions: Assess HgA1c results as ordered upon admission and as needed Assess patient nutrition upon admission and as needed per policy Notes: Wound/Skin Impairment Nursing Diagnoses: Knowledge deficit related to ulceration/compromised skin integrity Goals: Patient/caregiver will verbalize  understanding of skin care regimen Date Initiated: 01/15/2022 Target Resolution Date: 02/15/2022 Goal Status: Active Ulcer/skin breakdown will have a volume reduction of 30% by week 4 Date Initiated: 01/15/2022 Target Resolution Date: 03/17/2022 Goal Status: Active Ulcer/skin breakdown will have a volume reduction of 50% by week 8 Date Initiated: 01/15/2022 Target Resolution Date: 04/17/2022 Goal Status: Active Ulcer/skin breakdown will have a volume reduction of 80% by week 12 Date Initiated: 01/15/2022 Target Resolution Date: 05/18/2022 Goal Status: Active Ulcer/skin breakdown will heal within 14 weeks Date Initiated: 01/15/2022 Target Resolution Date: 06/16/2022 Goal Status: Active Interventions: Assess patient/caregiver ability to obtain necessary supplies Assess patient/caregiver ability to perform ulcer/skin care regimen upon admission and as needed Assess ulceration(s) every visit Notes: Electronic Signature(s) Signed: 02/08/2022 5:19:22 PM By: Elliot Gurney, [name], RN, CWS, [name], [name] Signed: 02/13/2022 7:52:55 AM By: Betha Loa Entered By: Betha Loa on 02/08/2022 14:04:47 Robert Levine ([id number]) [id number] Page 6 of 8 -------------------------------------------------------------------------------- Pain Assessment Details Patient Name: Date of Service: Robert Levine [facility] 02/08/2022 1:30 PM Medical Record Number: [id number] Patient Account Number: [id number] Date of Birth/Sex: Treating RN: [birth year] (66 y.o. Robert Levine Primary Care Docie Abramovich: Levine, PCP Other Clinician: Betha Loa Referring Caron Ode: Treating Estel Tonelli/Extender: Robert Levine, Robert Levine in Treatment: 3 Active Problems Location of Pain Severity and Description of Pain Patient Has Paino No Site Locations Pain Management and Medication Current Pain Management: Electronic Signature(s) Signed: 02/08/2022 5:19:22 PM By: Elliot Gurney, [name], RN, CWS, [name],  [name] Signed: 02/13/2022 7:52:55 AM By: Betha Loa Entered By: Betha Loa on 02/08/2022 13:54:37 -------------------------------------------------------------------------------- Wound Assessment Details Patient Name: Date of Service: Robert Levine, Robert [facility] 02/08/2022 1:30 PM Medical Record Number: [id number] Patient Account Number: [id number] Date of Birth/Sex: Treating RN: 1955/10/10 (66 y.o. Robert Levine Primary Care Creek Gan: Levine, PCP Other Clinician: Betha Loa Referring Lonzy Mato: Treating Foday Cone/Extender: Robert Levine, Robert Levine in Treatment: 3 Robert Levine ([id number]) [id number] Page 7 of 8 Wound Status Wound Number: 1 Primary Etiology: Diabetic Wound/Ulcer of the Lower Extremity Wound Location: Right Metatarsal head first Wound Status: Open Wounding Event: Gradually Appeared Comorbid History: Hypertension, Type II Diabetes Date Acquired: 04/02/2021 Weeks Of Treatment: 3 Clustered Wound: No Photos Wound Measurements Length: (cm) 0.4 Width: (cm) 0.4 Depth: (cm) 0.2 Area: (cm) 0.126 Volume: (cm) 0.025 % Reduction in Area: 80.2% % Reduction in Volume: 86.9% Epithelialization: None Tunneling: No Undermining: No Wound Description Classification: Grade 1 Exudate Amount: Medium Exudate Type: Serosanguineous Exudate Color: red, brown Foul Odor After Cleansing: No  Slough/Fibrino Yes Wound Bed Granulation Amount: None Present (0%) Exposed Structure Necrotic Amount: Large (67-100%) Fascia Exposed: No Necrotic Quality: Adherent Slough Fat Layer (Subcutaneous Tissue) Exposed: Yes Tendon Exposed: No Muscle Exposed: No Joint Exposed: No Bone Exposed: No Electronic Signature(s) Signed: 02/08/2022 5:19:22 PM By: Elliot Gurney, [name], RN, CWS, [name], [name] Signed: 02/13/2022 7:52:55 AM By: Betha Loa Entered By: Betha Loa on 02/08/2022  14:03:20 -------------------------------------------------------------------------------- Vitals Details Patient Name: Date of Service: Robert Levine, Robert Levine 02/08/2022 1:30 PM Medical Record Number: [id number] Patient Account Number: [id number] Date of Birth/Sex: Treating RN: 1955/12/12 (66 y.o. Robert Levine Primary Care Tania Steinhauser: Levine, PCP Other Clinician: Betha Loa Referring Xerxes Agrusa: Treating Oran Dillenburg/Extender: Robert Levine, Robert Levine in Treatment: 3 Vital Signs COADY, TRAIN ([id number]) [id number] Page 8 of 8 Time Taken: 13:53 Temperature (F): 98.3 Height (in): 76 Pulse (bpm): 96 Weight (lbs): 267 Respiratory Rate (breaths/min): 16 Body Mass Index (BMI): 32.5 Blood Pressure (mmHg): 130/76 Reference Range: 80 - 120 mg / dl Electronic Signature(s) Signed: 02/13/2022 7:52:55 AM By: Betha Loa Entered By: Betha Loa on 02/08/2022 13:54:34

## 2022-02-15 ENCOUNTER — Encounter: Payer: Medicare Other | Admitting: Physician Assistant

## 2022-02-15 DIAGNOSIS — E11621 Type 2 diabetes mellitus with foot ulcer: Secondary | ICD-10-CM | POA: Diagnosis not present

## 2022-02-15 NOTE — Progress Notes (Addendum)
Robert Levine ([id number]) [id number] Page 1 of 8 Visit Report for 02/15/2022 Chief Complaint Document Details Patient Name: Date of Service: Robert Levine [facility] 02/15/2022 2:15 PM Medical Record Number: [id number] Patient Account Number: [id number] Date of Birth/Sex: Treating RN: [birth year] (66 y.o. Robert Levine Primary Care Provider: SYSTEM, PCP Other Clinician: Betha Loa Referring Provider: Treating Provider/Extender: Silvestre Moment in Treatment: 4 Information Obtained from: Patient Chief Complaint Right foot ulcer at amputation site Electronic Signature(s) Signed: 02/15/2022 2:12:59 PM By: Lenda Kelp PA-C Entered By: Lenda Kelp on 02/15/2022 14:12:59 -------------------------------------------------------------------------------- Debridement Details Patient Name: Date of Service: Robert Levine, [name] [facility] 02/15/2022 2:15 PM Medical Record Number: [id number] Patient Account Number: [id number] Date of Birth/Sex: Treating RN: 1955-04-06 (66 y.o. Robert Levine Primary Care Provider: SYSTEM, PCP Other Clinician: Betha Loa Referring Provider: Treating Provider/Extender: Silvestre Moment in Treatment: 4 Debridement Performed for Assessment: Wound #1 Right Metatarsal head first Performed By: Physician Nelida Meuse., PA-C Debridement Type: Debridement Severity of Tissue Pre Debridement: Fat layer exposed Level of Consciousness (Pre-procedure): Awake and Alert Pre-procedure Verification/Time Out Yes - 15:21 Taken: Start Time: 15:21 T Area Debrided (L x W): otal 1 (cm) x 1 (cm) = 1 (cm) Tissue and other material debrided: Viable, Non-Viable, Callus, Slough, Subcutaneous, Slough Level: Skin/Subcutaneous Tissue Debridement Description: Excisional Instrument: Curette Bleeding: Minimum Hemostasis Achieved: Pressure End Time: 15:24 Response to Treatment: Procedure was tolerated well Level of Consciousness  (Post- Awake and Alert procedure): Robert Levine, Robert Levine ([id number]) [id number] Page 2 of 8 Post Debridement Measurements of Total Wound Length: (cm) 0.8 Width: (cm) 0.4 Depth: (cm) 0.2 Volume: (cm) 0.05 Character of Wound/Ulcer Post Debridement: Stable Severity of Tissue Post Debridement: Fat layer exposed Post Procedure Diagnosis Same as Pre-procedure Electronic Signature(s) Signed: 02/15/2022 5:06:55 PM By: Betha Loa Signed: 02/16/2022 7:25:15 AM By: Elliot Gurney, [name], RN, CWS, [name], [name] Signed: 02/16/2022 2:32:55 PM By: Lenda Kelp PA-C Entered By: Betha Loa on 02/15/2022 15:24:50 -------------------------------------------------------------------------------- HPI Details Patient Name: Date of Service: Robert Levine, [name] [facility] 02/15/2022 2:15 PM Medical Record Number: [id number] Patient Account Number: [id number] Date of Birth/Sex: Treating RN: 06-28-1955 (66 y.o. Robert Levine Primary Care Provider: SYSTEM, PCP Other Clinician: Betha Loa Referring Provider: Treating Provider/Extender: Silvestre Moment in Treatment: 4 History of Present Illness HPI Description: 01-15-2022 patient presents today as a referral from Dr. Rosetta Posner who is a local podiatrist due to a foot wound that is not getting better. The patient unfortunately has had this going on for quite some time he is not necessarily the best historian which makes it a little bit difficult but nonetheless I did review notes as well together where things stand what is going on right now has been using Vaseline over the area according to what he tells me he has had debridements with Dr. Excell Seltzer intermittently. With that being said the patient has not really been using his offloading shoe as much she does have diabetic shoes and he tells me that is primarily what has been wearing. 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 He also has high blood pressure. 01-25-2022 upon evaluation today patient's wound actually showing signs of improvement though there is still little bit of callus and some biofilm and slough buildup on the surface of the wound. I Minna perform sharp debridement to clear  this away today. He tells me has been using the shoe a lot of the time though sometimes when he is at the assisted living facility he just walks around with a sock not using the shoe. I explained that he needs to make sure to always have the shoe on when he is up and moving around as can help this to heal most effectively. 02-01-2022 upon evaluation today patient appears to be doing well currently in regard to his wound from the standpoint of this not getting worse it does not look to be infected. With that being said unfortunately he does have an open wound that is going to require some sharp debridement today and again I am concerned that if we do not get this closed he is going to end up with this worsening in general. He has not really been wearing the postop shoe with front offloading unfortunately which means that he is not really getting much better. I think that a total contact cast is probably going to be the method in which to try to get this healed as quickly as possible. Again the sooner we get healed lessen the chance that this will develop into a more significant ulceration requiring any type of surgery. We especially would avoid any chance for amputation. 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 Here for application of TCC #1 11/9; obligatory first total contact cast change. The patient had no particular problems. TCC reapplied 02-15-2022 upon evaluation today patient appears to be doing well currently in regard to his wound. There is some callus however buildup that is going to need to be cleared away. Fortunately  there does not appear to be any signs of infection this is actually his third cast application today although the first 2 were last week and this is already quite a bit smaller. Electronic Signature(s) Signed: 02/15/2022 3:26:54 PM By: Lenda Kelp PA-C Entered By: Lenda Kelp on 02/15/2022 15:26:54 Robert Levine ([id number]) [id number] Page 3 of 8 -------------------------------------------------------------------------------- Physical Exam Details Patient Name: Date of Service: Robert Levine [facility] 02/15/2022 2:15 PM Medical Record Number: [id number] Patient Account Number: [id number] Date of Birth/Sex: Treating RN: [birth year] (66 y.o. Robert Levine Primary Care Provider: SYSTEM, PCP Other Clinician: Betha Loa Referring Provider: Treating Provider/Extender: Silvestre Moment in Treatment: 4 Constitutional Well-nourished and well-hydrated in no acute distress. Respiratory normal breathing without difficulty. Psychiatric this patient is able to make decisions and demonstrates good insight into disease process. Alert and Oriented x 3. pleasant and cooperative. Notes Upon inspection patient's wound did require some sharp debridement there was a little bit of the wound section that was open underneath the section of callus more in the lateral in regard to the position of the wound to the foot. I did perform debridement to clear this away it is not a large area but nonetheless there was a little bit of fluid trapped. Given this cleared away I think we will definitely improve his chances of getting this to healing much more effectively and quickly. Electronic Signature(s) Signed: 02/15/2022 3:27:29 PM By: Lenda Kelp PA-C Entered By: Lenda Kelp on 02/15/2022 15:27:29 -------------------------------------------------------------------------------- Physician Orders Details Patient Name: Date of Service: Robert Levine, [name] [facility]  02/15/2022 2:15 PM Medical Record Number: [id number] Patient Account Number: [id number] Date of Birth/Sex: Treating RN: 1955/08/15 (66 y.o. Robert Levine Primary Care Provider: SYSTEM, PCP Other Clinician: Betha Loa Referring Provider: Treating Provider/Extender: Silvestre Moment in Treatment: 4  Verbal / Phone Orders: No Diagnosis Coding ICD-10 Coding Code Description E11.621 Type 2 diabetes mellitus with foot ulcer L97.512 Non-pressure chronic ulcer of other part of right foot with fat layer exposed Z89.411 Acquired absence of right great toe N18.30 Chronic kidney disease, stage 3 unspecified I10 Essential (primary) hypertension ALMA, MUEGGE ([id number]) [id number] Page 4 of 8 Follow-up Appointments Return Appointment in 1 week. Home Health [facility] for wound care. May utilize formulary equivalent dressing for wound treatment orders unless otherwise specified. Home Health Nurse may visit PRN to address patients wound care needs. - [facility] [phone] Bathing/ Shower/ Hygiene Clean wound with Normal Saline or wound cleanser. May shower with wound dressing protected with water repellent cover or cast protector. No tub bath. Anesthetic (Use 'Patient Medications' Section for Anesthetic Order Entry) Lidocaine applied to wound bed Edema Control - Lymphedema / Segmental Compressive Device / Other Elevate, Exercise Daily and A void Standing for Long Periods of Time. Elevate legs to the level of the heart and pump ankles as often as possible Elevate leg(s) parallel to the floor when sitting. Off-Loading Total Contact Cast to Right Lower Extremity - TCC # 3 appiled Other: - front offloading shoe Wound Treatment Wound #1 - Metatarsal head first Wound Laterality: Right Prim Dressing: Hydrofera Blue Ready Transfer Foam, 2.5x2.5 (in/in) 3 x Per Week ary Discharge Instructions: Apply Hydrofera Blue Ready to wound bed as  directed Secondary Dressing: Coverlet Latex-Free Fabric Adhesive Dressings 3 x Per Week Discharge Instructions: 1.5 x 2 Electronic Signature(s) Signed: 02/15/2022 5:06:55 PM By: Betha Loa Signed: 02/16/2022 2:32:55 PM By: Lenda Kelp PA-C Entered By: Betha Loa on 02/15/2022 15:25:47 -------------------------------------------------------------------------------- Problem List Details Patient Name: Date of Service: Robert Levine, [name] 02/15/2022 2:15 PM Medical Record Number: [id number] Patient Account Number: [id number] Date of Birth/Sex: Treating RN: 1955/10/09 (66 y.o. Robert Levine Primary Care Provider: SYSTEM, PCP Other Clinician: Betha Loa Referring Provider: Treating Provider/Extender: Silvestre Moment in Treatment: 4 Active Problems ICD-10 Encounter Code Description Active Date MDM Diagnosis E11.621 Type 2 diabetes mellitus with foot ulcer 01/15/2022 No Yes L97.512 Non-pressure chronic ulcer of other part of right foot with fat layer exposed 01/15/2022 No Yes Z89.411 Acquired absence of right great toe 01/15/2022 No Yes LUCILE, DIDONATO ([id number]) [id number] Page 5 of 8 N18.30 Chronic kidney disease, stage 3 unspecified 01/15/2022 No Yes I10 Essential (primary) hypertension 01/15/2022 No Yes Inactive Problems Resolved Problems Electronic Signature(s) Signed: 02/15/2022 2:12:54 PM By: Lenda Kelp PA-C Entered By: Lenda Kelp on 02/15/2022 14:12:54 -------------------------------------------------------------------------------- Progress Note Details Patient Name: Date of Service: Robert Levine, [name] [facility] 02/15/2022 2:15 PM Medical Record Number: [id number] Patient Account Number: [id number] Date of Birth/Sex: Treating RN: [birth year] (66 y.o. Robert Levine Primary Care Provider: SYSTEM, PCP Other Clinician: Betha Loa Referring Provider: Treating Provider/Extender: Silvestre Moment in  Treatment: 4 Subjective Chief Complaint Information obtained from Patient Right foot ulcer at amputation site History of Present Illness (HPI) 01-15-2022 patient presents today as a referral from Dr. Rosetta Posner who is a local podiatrist due to a foot wound that is not getting better. The patient unfortunately has had this going on for quite some time he is not necessarily the best historian which makes it a little bit difficult but nonetheless I did review notes as well together where things stand what is going on right now has been using Vaseline over the area according to what he tells me he has had debridements with Dr. Excell Seltzer  intermittently. With that being said the patient has not really been using his offloading shoe as much she does have diabetic shoes and he tells me that is primarily what has been wearing. 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 He also has high blood pressure. 01-25-2022 upon evaluation today patient's wound actually showing signs of improvement though there is still little bit of callus and some biofilm and slough buildup on the surface of the wound. I Minna perform sharp debridement to clear this away today. He tells me has been using the shoe a lot of the time though sometimes when he is at the assisted living facility he just walks around with a sock not using the shoe. I explained that he needs to make sure to always have the shoe on when he is up and moving around as can help this to heal most effectively. 02-01-2022 upon evaluation today patient appears to be doing well currently in regard to his wound from the standpoint of this not getting worse it does not look to be infected. With that being said unfortunately he does have an open wound that is going to require some sharp debridement today and again I  am concerned that if we do not get this closed he is going to end up with this worsening in general. He has not really been wearing the postop shoe with front offloading unfortunately which means that he is not really getting much better. I think that a total contact cast is probably going to be the method in which to try to get this healed as quickly as possible. Again the sooner we get healed lessen the chance that this will develop into a more significant ulceration requiring any type of surgery. We especially would avoid any chance for amputation. 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 Here for application of TCC #1 11/9; obligatory first total contact cast change. The patient had no particular problems. TCC reapplied 02-15-2022 upon evaluation today patient appears to be doing well currently in regard to his wound. There is some callus however buildup that is going to need to be cleared away. Fortunately there does not appear to be any signs of infection this is actually his third cast application today although the first 2 were last week and this is already quite a bit smaller. Robert Levine, Robert Levine ([id number]) [id number] Page 6 of 8 Objective Constitutional Well-nourished and well-hydrated in no acute distress. Vitals Time Taken: 2:51 PM, Height: 76 in, Weight: 267 lbs, BMI: 32.5, Temperature: 98.5 F, Pulse: 79 bpm, Respiratory Rate: 16 breaths/min, Blood Pressure: 143/66 mmHg. Respiratory normal breathing without difficulty. Psychiatric this patient is able to make decisions and demonstrates good insight into disease process. Alert and Oriented x 3. pleasant and cooperative. General Notes: Upon inspection patient's wound did require some sharp debridement there was a little bit of the wound section that was open underneath the section of callus more in the lateral in regard to the position of the wound to the foot. I did perform debridement to clear  this away it is not a large area but nonetheless there was a little bit of fluid trapped. Given this cleared away I think we will definitely improve his chances of getting this to healing much more effectively and quickly. Integumentary (Hair, Skin) Wound #1 status is Open. Original cause of wound was  Gradually Appeared. The date acquired was: 04/02/2021. The wound has been in treatment 4 weeks. The wound is located on the Right Metatarsal head first. The wound measures 0.4cm length x 0.4cm width x 0.2cm depth; 0.126cm^2 area and 0.025cm^3 volume. There is Fat Layer (Subcutaneous Tissue) exposed. There is no tunneling or undermining noted. There is a medium amount of serosanguineous drainage noted. There is no granulation within the wound bed. There is a large (67-100%) amount of necrotic tissue within the wound bed including Adherent Slough. 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 There was a Excisional Skin/Subcutaneous Tissue Debridement with a total area of 1 sq cm performed by Nelida MeuseStone, Phoenix Riesen E., PA-C. With the following instrument(s): Curette to remove Viable and Non-Viable tissue/material. Material removed includes Callus, Subcutaneous Tissue, and Slough. A time out was conducted at 15:21, prior to the start of the procedure. A Minimum amount of bleeding was controlled with Pressure. The procedure was tolerated well. Post Debridement Measurements: 0.8cm length x 0.4cm width x 0.2cm depth; 0.05cm^3 volume. Character of Wound/Ulcer Post Debridement is stable. Severity  of Tissue Post Debridement is: Fat layer exposed. Post procedure Diagnosis Wound #1: Same as Pre-Procedure Pre-procedure diagnosis of Wound #1 is a Diabetic Wound/Ulcer of the Lower Extremity located on the Right Metatarsal head first . There was a T Contact otal Cast Procedure by Nelida MeuseStone, Don Giarrusso E., PA-C. Post procedure Diagnosis Wound #1: Same as Pre-Procedure Plan Follow-up Appointments: Return Appointment in 1 week. [facility] May utilize formulary equivalent dressing for wound treatment orders unless otherwise specified. Home Health Nurse may visit PRN to address patientoos wound care needs. - [facility] [phone] Bathing/ Shower/ Hygiene: Clean wound with Normal Saline or wound cleanser. May shower with wound dressing protected with water repellent cover or cast protector. No tub bath. Anesthetic (Use 'Patient Medications' Section for Anesthetic Order Entry): Lidocaine applied to wound bed Edema Control - Lymphedema / Segmental Compressive Device / OtherRoxy Levine: Trainer, Mahamadou ([id number]) [id number] Page 7 of 8 Elevate, Exercise Daily and Avoid Standing for Long Periods of Time. Elevate legs to the level of the heart and pump ankles as often as possible Elevate leg(s) parallel to the floor when sitting. Off-Loading: T Contact Cast to Right Lower Extremity - TCC # 3 appiled otal Other: - front offloading shoe WOUND #1: - Metatarsal head first Wound Laterality: Right Prim Dressing: Hydrofera Blue Ready Transfer Foam, 2.5x2.5 (in/in) 3 x Per Week/ ary Discharge Instructions: Apply Hydrofera Blue Ready to wound bed as directed Secondary Dressing: Coverlet Latex-Free Fabric Adhesive Dressings 3 x Per Week/ Discharge Instructions: 1.5 x 2 1. I would recommend currently that we have the patient continue to monitor for any signs of worsening or infection. With that being said I am definitely trying to keep things moving in  the right direction he seems to really be doing excellent and overall I think were under good control here with the cast. 2. I did a reapply the total contact cast I feel like this is doing a good job and overall I think we are on the right track at this point. We  will see patient back for reevaluation in 1 week here in the clinic. If anything worsens or changes patient will contact our office for additional recommendations. Electronic Signature(s) Signed: 02/15/2022 3:28:11 PM By: Lenda Kelp PA-C Entered By: Lenda Kelp on 02/15/2022 15:28:11 -------------------------------------------------------------------------------- Total Contact Cast Details Patient Name: Date of Service: Robert Levine [facility] 02/15/2022 2:15 PM Medical Record Number: [id number] Patient Account Number: [id number] Date of Birth/Sex: Treating RN: 07-07-1955 (66 y.o. Robert Levine Primary Care Provider: SYSTEM, PCP Other Clinician: Betha Loa Referring Provider: Treating Provider/Extender: Silvestre Moment in Treatment: 4 T Contact Cast Applied for Wound Assessment: otal Wound #1 Right Metatarsal head first Performed By: Physician Nelida Meuse., PA-C Post Procedure Diagnosis Same as Pre-procedure Electronic Signature(s) Signed: 02/15/2022 5:06:55 PM By: Betha Loa Signed: 02/16/2022 2:32:55 PM By: Lenda Kelp PA-C Entered By: Betha Loa on 02/15/2022 15:25:27 -------------------------------------------------------------------------------- SuperBill Details Patient Name: Date of Service: Robert Levine, [name] 02/15/2022 Robert Levine ([id number]) [id number] Page 8 of 8 Medical Record Number: [id number] Patient Account Number: [id number] Date of Birth/Sex: Treating RN: [birth year] (66 y.o. Robert Levine Primary Care Provider: SYSTEM, PCP Other Clinician: Betha Loa Referring Provider: Treating Provider/Extender: Silvestre Moment in Treatment: 4 Diagnosis Coding ICD-10 Codes Code Description [phone] Type 2 diabetes mellitus with foot ulcer L97.512 Non-pressure chronic ulcer of other part of right foot with fat layer exposed Z89.411 Acquired absence of right great toe N18.30 Chronic kidney disease, stage 3 unspecified I10 Essential (primary) hypertension Facility Procedures : CPT4 Code: [id number] Description: 11042 - DEB SUBQ TISSUE 20 SQ CM/< ICD-10 Diagnosis Description L97.512 Non-pressure chronic ulcer of other part of right foot with fat layer exposed Modifier: Quantity: 1 Physician Procedures : CPT4 Code Description Modifier [id number] 11042 - WC PHYS SUBQ TISS 20 SQ CM ICD-10 Diagnosis Description L97.512 Non-pressure chronic ulcer of other part of right foot with fat layer exposed Quantity: 1 Electronic Signature(s) Signed: 02/15/2022 3:28:20 PM By: Lenda Kelp PA-C Entered By: Lenda Kelp on 02/15/2022 15:28:20

## 2022-02-15 NOTE — Progress Notes (Addendum)
JOWELL, SHORTER ([id number]) [id number] Page 1 of 9 Visit Report for 02/15/2022 Arrival Information Details Patient Name: Date of Service: Robert Levine [facility] 02/15/2022 2:15 PM Medical Record Number: [id number] Patient Account Number: [id number] Date of Birth/Sex: Treating RN: 1955/05/23 (66 y.o. Isac Sarna, Maudie Mercury Primary Care Prynce Jacober: SYSTEM, PCP Other Clinician: Massie Kluver Referring Darriona Dehaas: Treating Sadarius Norman/Extender: Garry Heater in Treatment: 4 Visit Information History Since Last Visit All ordered tests and consults were completed: No Patient Arrived: Gilford Rile Added or deleted any medications: No Arrival Time: 14:50 Any new allergies or adverse reactions: No Transfer Assistance: None Had a fall or experienced change in No Patient Requires Transmission-Based Precautions: No activities of daily living that may affect Patient Has Alerts: Yes risk of falls: Patient Alerts: DIABETIC Signs or symptoms of abuse/neglect since last visito No NON COMPRESSABLE Hospitalized since last visit: No Implantable device outside of the clinic excluding No cellular tissue based products placed in the center since last visit: Pain Present Now: No Electronic Signature(s) Signed: 02/15/2022 5:06:55 PM By: Massie Kluver Entered By: Massie Kluver on 02/15/2022 14:51:05 -------------------------------------------------------------------------------- Clinic Level of Care Assessment Details Patient Name: Date of Service: Robert Levine [facility] 02/15/2022 2:15 PM Medical Record Number: [id number] Patient Account Number: [id number] Date of Birth/Sex: Treating RN: 12/16/1955 (66 y.o. [name] Primary Care Alecia Doi: SYSTEM, PCP Other Clinician: Massie Kluver Referring Jaquetta Currier: Treating Ameka Krigbaum/Extender: Garry Heater in Treatment: 4 Clinic Level of Care Assessment Items TOOL 1 Quantity Score []  - 0 Use when EandM and Procedure  is performed on INITIAL visit ASSESSMENTS - Nursing Assessment / Reassessment []  - 0 General Physical Exam (combine w/ comprehensive assessment (listed just below) when performed on new pt. evals) []  - 0 Comprehensive Assessment (HX, ROS, Risk Assessments, Wounds Hx, etc.) TADEUS, QUINN ([id number]) [id number] Page 2 of 9 ASSESSMENTS - Wound and Skin Assessment / Reassessment []  - 0 Dermatologic / Skin Assessment (not related to wound area) ASSESSMENTS - Ostomy and/or Continence Assessment and Care []  - 0 Incontinence Assessment and Management []  - 0 Ostomy Care Assessment and Management (repouching, etc.) PROCESS - Coordination of Care []  - 0 Simple Patient / Family Education for ongoing care []  - 0 Complex (extensive) Patient / Family Education for ongoing care []  - 0 Staff obtains Programmer, systems, Records, T Results / Process Orders est []  - 0 Staff telephones HHA, Nursing Homes / Clarify orders / etc []  - 0 Routine Transfer to another Facility (non-emergent condition) []  - 0 Routine Hospital Admission (non-emergent condition) []  - 0 New Admissions / Biomedical engineer / Ordering NPWT Apligraf, etc. , []  - 0 Emergency Hospital Admission (emergent condition) PROCESS - Special Needs []  - 0 Pediatric / Minor Patient Management []  - 0 Isolation Patient Management []  - 0 Hearing / Language / Visual special needs []  - 0 Assessment of Community assistance (transportation, D/C planning, etc.) []  - 0 Additional assistance / Altered mentation []  - 0 Support Surface(s) Assessment (bed, cushion, seat, etc.) INTERVENTIONS - Miscellaneous []  - 0 External ear exam []  - 0 Patient Transfer (multiple staff / Civil Service fast streamer / Similar devices) []  - 0 Simple Staple / Suture removal (25 or less) []  - 0 Complex Staple / Suture removal (26 or more) []  - 0 Hypo/Hyperglycemic Management (do not check if billed separately) []  - 0 Ankle / Brachial Index (ABI)  - do not check if billed separately Has the patient been seen at the hospital within the last three years: Yes Total Score: 0  Level Of Care: ____ Electronic Signature(s) Signed: 02/15/2022 5:06:55 PM By: Massie Kluver Entered By: Massie Kluver on 02/15/2022 15:25:52 -------------------------------------------------------------------------------- Encounter Discharge Information Details Patient Name: Date of Service: [name], [name] [facility] 02/15/2022 2:15 PM Medical Record Number: [id number] Patient Account Number: [id number] Date of Birth/Sex: Treating RN: 13-Jul-1955 (66 y.o. [name] Primary Care Mert Dietrick: SYSTEM, PCP Other Clinician: Massie Kluver Referring Johnny Gorter: Treating Winifred Bodiford/Extender: Garry Heater in Treatment: 4 [place], Iona Beard ([id number]) [id number] Page 3 of 9 Encounter Discharge Information Items Post Procedure Vitals Discharge Condition: Stable Temperature (F): 98.5 Ambulatory Status: Walker Pulse (bpm): 79 Discharge Destination: Home Respiratory Rate (breaths/min): 18 Transportation: Other Blood Pressure (mmHg): 143/66 Accompanied By: self Schedule Follow-up Appointment: Yes Clinical Summary of Care: Electronic Signature(s) Signed: 02/15/2022 5:06:55 PM By: Massie Kluver Entered By: Massie Kluver on 02/15/2022 17:01:41 -------------------------------------------------------------------------------- Lower Extremity Assessment Details Patient Name: Date of Service: Robert Levine [facility] 02/15/2022 2:15 PM Medical Record Number: [id number] Patient Account Number: [id number] Date of Birth/Sex: Treating RN: 1955-10-08 (66 y.o. [name] Primary Care Varina Hulon: SYSTEM, PCP Other Clinician: Massie Kluver Referring Malene Blaydes: Treating Melquan Ernsberger/Extender: Garry Heater in Treatment: 4 Edema Assessment Assessed: [Left: No] [Right: Yes] Edema: [Left: Ye] [Right: s] Calf Left: Right: Point of  Measurement: 42 cm From Medial Instep 44.4 cm Ankle Left: Right: Point of Measurement: 10 cm From Medial Instep 32 cm Vascular Assessment Pulses: Dorsalis Pedis Palpable: [Right:Yes] Electronic Signature(s) Signed: 02/15/2022 5:06:55 PM By: Massie Kluver Signed: 02/16/2022 7:25:15 AM By: Gretta Cool, [name], RN, CWS, [name], [name] Entered By: Massie Kluver on 02/15/2022 15:07:35 Vicenta Dunning ([id number]) [id number] Page 4 of 9 -------------------------------------------------------------------------------- Multi Wound Chart Details Patient Name: Date of Service: Robert Levine [facility] 02/15/2022 2:15 PM Medical Record Number: [id number] Patient Account Number: [id number] Date of Birth/Sex: Treating RN: 1956/03/11 (66 y.o. [name] Primary Care Shawntelle Ungar: SYSTEM, PCP Other Clinician: Massie Kluver Referring Fabio Wah: Treating Flay Ghosh/Extender: Garry Heater in Treatment: 4 Vital Signs Height(in): 3 Pulse(bpm): 59 Weight(lbs): 267 Blood Pressure(mmHg): 143/66 Body Mass Index(BMI): 32.5 Temperature(F): 98.5 Respiratory Rate(breaths/min): 16 [1:Photos:] [N/A:N/A] Right Metatarsal head first N/A N/A Wound Location: Gradually Appeared N/A N/A Wounding Event: Diabetic Wound/Ulcer of the Lower N/A N/A Primary Etiology: Extremity Hypertension, Type II Diabetes N/A N/A Comorbid History: 04/02/2021 N/A N/A Date Acquired: 4 N/A N/A Weeks of Treatment: Open N/A N/A Wound Status: No N/A N/A Wound Recurrence: 0.4x0.4x0.2 N/A N/A Measurements L x W x D (cm) 0.126 N/A N/A A (cm) : rea 0.025 N/A N/A Volume (cm) : 80.20% N/A N/A % Reduction in A rea: 86.90% N/A N/A % Reduction in Volume: Grade 1 N/A N/A Classification: Medium N/A N/A Exudate A mount: Serosanguineous N/A N/A Exudate Type: red, brown N/A N/A Exudate Color: None Present (0%) N/A N/A Granulation A mount: Large (67-100%) N/A N/A Necrotic A mount: Fat Layer  (Subcutaneous Tissue): Yes N/A N/A Exposed Structures: Fascia: No Tendon: No Muscle: No Joint: No Bone: No None N/A N/A Epithelialization: Treatment Notes Electronic Signature(s) Signed: 02/15/2022 5:06:55 PM By: Massie Kluver Entered By: Massie Kluver on 02/15/2022 15:07:52 Vicenta Dunning ([id number]) [id number] Page 5 of 9 -------------------------------------------------------------------------------- Multi-Disciplinary Care Plan Details Patient Name: Date of Service: Robert Levine [facility] 02/15/2022 2:15 PM Medical Record Number: [id number] Patient Account Number: [id number] Date of Birth/Sex: Treating RN: January 19, 1956 (66 y.o. [name] Primary Care Yamaris Cummings: SYSTEM, PCP Other Clinician: Massie Kluver Referring Dakari Cregger: Treating Jacquese Cassarino/Extender: Garry Heater in Treatment: 4 Active Inactive Abuse /  Safety / Falls / Self Care Management Nursing Diagnoses: Potential for injury related to falls Goals: Patient will remain injury free related to falls Date Initiated: 01/15/2022 Target Resolution Date: 02/15/2022 Goal Status: Active Interventions: Assess Activities of Daily Living upon admission and as needed Assess fall risk on admission and as needed Assess: immobility, friction, shearing, incontinence upon admission and as needed Assess impairment of mobility on admission and as needed per policy Assess personal safety and home safety (as indicated) on admission and as needed Assess self care needs on admission and as needed Notes: Necrotic Tissue Nursing Diagnoses: Impaired tissue integrity related to necrotic/devitalized tissue Knowledge deficit related to management of necrotic/devitalized tissue Goals: Necrotic/devitalized tissue will be minimized in the wound bed Date Initiated: 02/06/2022 Target Resolution Date: 02/06/2022 Goal Status: Active Patient/caregiver will verbalize understanding of reason and process  for debridement of necrotic tissue Date Initiated: 02/06/2022 Target Resolution Date: 02/06/2022 Goal Status: Active Interventions: Assess patient pain level pre-, during and post procedure and prior to discharge Provide education on necrotic tissue and debridement process Treatment Activities: Apply topical anesthetic as ordered : 02/06/2022 Excisional debridement : 02/06/2022 Notes: Nutrition Nursing Diagnoses: Impaired glucose control: actual or potential Goals: Patient/caregiver will maintain therapeutic glucose control Date Initiated: 01/15/2022 Target Resolution Date: 03/17/2022 JOJUAN, ALVERSON ([id number]) [id number] Page 6 of 9 Goal Status: Active Interventions: Assess HgA1c results as ordered upon admission and as needed Assess patient nutrition upon admission and as needed per policy Notes: Wound/Skin Impairment Nursing Diagnoses: Knowledge deficit related to ulceration/compromised skin integrity Goals: Patient/caregiver will verbalize understanding of skin care regimen Date Initiated: 01/15/2022 Target Resolution Date: 02/15/2022 Goal Status: Active Ulcer/skin breakdown will have a volume reduction of 30% by week 4 Date Initiated: 01/15/2022 Target Resolution Date: 03/17/2022 Goal Status: Active Ulcer/skin breakdown will have a volume reduction of 50% by week 8 Date Initiated: 01/15/2022 Target Resolution Date: 04/17/2022 Goal Status: Active Ulcer/skin breakdown will have a volume reduction of 80% by week 12 Date Initiated: 01/15/2022 Target Resolution Date: 05/18/2022 Goal Status: Active Ulcer/skin breakdown will heal within 14 weeks Date Initiated: 01/15/2022 Target Resolution Date: 06/16/2022 Goal Status: Active Interventions: Assess patient/caregiver ability to obtain necessary supplies Assess patient/caregiver ability to perform ulcer/skin care regimen upon admission and as needed Assess ulceration(s) every  visit Notes: Electronic Signature(s) Signed: 02/15/2022 5:06:55 PM By: Massie Kluver Signed: 02/16/2022 7:25:15 AM By: Gretta Cool, [name], RN, CWS, [name], [name] Entered By: Massie Kluver on 02/15/2022 15:07:40 -------------------------------------------------------------------------------- Pain Assessment Details Patient Name: Date of Service: [name], [name] [facility] 02/15/2022 2:15 PM Medical Record Number: [id number] Patient Account Number: [id number] Date of Birth/Sex: Treating RN: [birth year] (66 y.o. [name] Primary Care Shone Leventhal: SYSTEM, PCP Other Clinician: Massie Kluver Referring Shallyn Constancio: Treating Shanicka Oldenkamp/Extender: Garry Heater in Treatment: 4 Active Problems Location of Pain Severity and Description of Pain Patient Has Paino No Site Locations Walker, Iona Beard ([id number]) [id number] Page 7 of 9 Pain Management and Medication Current Pain Management: Electronic Signature(s) Signed: 02/15/2022 5:06:55 PM By: Massie Kluver Signed: 02/16/2022 7:25:15 AM By: Gretta Cool, [name], RN, CWS, [name], [name] Entered By: Massie Kluver on 02/15/2022 14:53:44 -------------------------------------------------------------------------------- Patient/Caregiver Education Details Patient Name: Date of Service: [name], [name] [facility] 11/16/2023andnbsp2:15 PM Medical Record Number: [id number] Patient Account Number: [id number] Date of Birth/Gender: Treating RN: 1955-05-11 (66 y.o. [name] Primary Care Physician: SYSTEM, PCP Other Clinician: Massie Kluver Referring Physician: Treating Physician/Extender: Garry Heater in Treatment: 4 Education Assessment Education Provided To: Patient Education Topics Provided Wound/Skin  Impairment: Handouts: Other: continue wound care as directed Methods: Explain/Verbal Responses: State content correctly Electronic Signature(s) Signed: 02/15/2022 5:06:55 PM By: Betha Loa Entered By: Betha Loa on 02/15/2022 17:00:04 [name] ([id number]) [id number] Page 8 of 9 -------------------------------------------------------------------------------- Wound Assessment Details Patient Name: Date of Service: [name] 02/15/2022 2:15 PM Medical Record Number: [id number] Patient Account Number: [id number] Date of Birth/Sex: Treating RN: 04-Oct-1955 (66 y.o. [name], [name] Primary Care Jazlynne Milliner: SYSTEM, PCP Other Clinician: Betha Loa Referring Kitiara Hintze: Treating Mikya Don/Extender: Silvestre Moment in Treatment: 4 Wound Status Wound Number: 1 Primary Etiology: Diabetic Wound/Ulcer of the Lower Extremity Wound Location: Right Metatarsal head first Wound Status: Open Wounding Event: Gradually Appeared Comorbid History: Hypertension, Type II Diabetes Date Acquired: 04/02/2021 Weeks Of Treatment: 4 Clustered Wound: No Photos Wound Measurements Length: (cm) 0.4 Width: (cm) 0.4 Depth: (cm) 0.2 Area: (cm) 0.12 Volume: (cm) 0.02 % Reduction in Area: 80.2% % Reduction in Volume: 86.9% Epithelialization: None 6 Tunneling: No 5 Undermining: No Wound Description Classification: Grade 1 Exudate Amount: Medium Exudate Type: Serosanguineous Exudate Color: red, brown Foul Odor After Cleansing: No Slough/Fibrino Yes Wound Bed Granulation Amount: None Present (0%) Exposed Structure Necrotic Amount: Large (67-100%) Fascia Exposed: No Necrotic Quality: Adherent Slough Fat Layer (Subcutaneous Tissue) Exposed: Yes Tendon Exposed: No Muscle Exposed: No Joint Exposed: No Bone Exposed: No Treatment Notes Wound #1 (Metatarsal head first) Wound Laterality: Right Cleanser Peri-Wound Care Topical JOAOVICTOR, KRONE ([id number]) [id number] Page 9 of 9 Primary Dressing Hydrofera Blue Ready Transfer Foam, 2.5x2.5 (in/in) Discharge Instruction: Apply Hydrofera Blue Ready to wound bed as directed Secondary  Dressing Coverlet Latex-Free Fabric Adhesive Dressings Discharge Instruction: 1.5 x 2 Secured With Compression Wrap Compression Stockings Add-Ons Electronic Signature(s) Signed: 02/15/2022 5:06:55 PM By: Betha Loa Signed: 02/16/2022 7:25:15 AM By: Elliot Gurney, [name], RN, CWS, [name], [name] Entered By: Betha Loa on 02/15/2022 15:05:57 -------------------------------------------------------------------------------- Vitals Details Patient Name: Date of Service: [name], [name] [facility] 02/15/2022 2:15 PM Medical Record Number: [id number] Patient Account Number: [id number] Date of Birth/Sex: Treating RN: 1955/08/20 (66 y.o. [name] Primary Care Taquita Demby: SYSTEM, PCP Other Clinician: Betha Loa Referring Norvell Caswell: Treating [place]/Extender: Silvestre Moment in Treatment: 4 Vital Signs Time Taken: 14:51 Temperature (F): 98.5 Height (in): 76 Pulse (bpm): 79 Weight (lbs): 267 Respiratory Rate (breaths/min): 16 Body Mass Index (BMI): 32.5 Blood Pressure (mmHg): 143/66 Reference Range: 80 - 120 mg / dl Electronic Signature(s) Signed: 02/15/2022 5:06:55 PM By: Betha Loa Entered By: Betha Loa on 02/15/2022 14:53:28

## 2022-02-19 ENCOUNTER — Encounter: Payer: Medicare Other | Admitting: Physician Assistant

## 2022-02-19 DIAGNOSIS — E11621 Type 2 diabetes mellitus with foot ulcer: Secondary | ICD-10-CM | POA: Diagnosis not present

## 2022-02-19 NOTE — Progress Notes (Addendum)
VISHAL, VARALLO ([id number]) [id number] Page 1 of 8 Visit Report for 02/19/2022 Arrival Information Details Patient Name: Date of Service: Robert Levine [facility] 02/19/2022 2:00 PM Medical Record Number: [id number] Patient Account Number: [id number] Date of Birth/Sex: Treating RN: [birth year] (66 y.o. [name]) Carlene Coria Primary Care Morgon Pamer: SYSTEM, PCP Other Clinician: Referring Corena Tilson: Treating Takeisha Cianci/Extender: Garry Heater in Treatment: 5 Visit Information History Since Last Visit All ordered tests and consults were completed: No Patient Arrived: Robert Levine: No Arrival Time: 13:58 Any new allergies or adverse reactions: No Accompanied By: self Had a fall or experienced change in No Transfer Assistance: None activities of daily living that may affect Patient Identification Verified: Yes risk of falls: Secondary Verification Process Completed: Yes Signs or symptoms of abuse/neglect since last visito No Patient Requires Transmission-Based Precautions: No Hospitalized since last visit: No Patient Has Alerts: Yes Implantable device outside of the clinic excluding No Patient Alerts: DIABETIC cellular tissue based products placed in the center NON [place] since last visit: Has Dressing in Place as Prescribed: Yes Has Footwear/Offloading in Place as Prescribed: Yes Right: T Contact Cast otal Pain Present Now: No Electronic Signature(s) Signed: 02/19/2022 2:58:44 PM By: Carlene Coria RN Entered By: Carlene Coria on 02/19/2022 14:03:47 -------------------------------------------------------------------------------- Clinic Level of Care Assessment Details Patient Name: Date of Service: Robert Levine [facility] 02/19/2022 2:00 PM Medical Record Number: [id number] Patient Account Number: [id number] Date of Birth/Sex: Treating RN: 09/07/1955 (66 y.o. [name]) Carlene Coria Primary Care Nataliyah Packham: SYSTEM, PCP Other  Clinician: Referring Etna Forquer: Treating Chatara Lucente/Extender: Garry Heater in Treatment: 5 Clinic Level of Care Assessment Items TOOL 1 Quantity Score []  - 0 Use when EandM and Procedure is performed on INITIAL visit ASSESSMENTS - Nursing Assessment / Reassessment []  - 0 General Physical Exam (combine w/ comprehensive assessment (listed just below) when performed on new pt. [address] ([id number]) [id number] Page 2 of 8 []  - 0 Comprehensive Assessment (HX, ROS, Risk Assessments, Wounds Hx, etc.) ASSESSMENTS - Wound and Skin Assessment / Reassessment []  - 0 Dermatologic / Skin Assessment (not related to wound area) ASSESSMENTS - Ostomy and/or Continence Assessment and Care []  - 0 Incontinence Assessment and Management []  - 0 Ostomy Care Assessment and Management (repouching, etc.) PROCESS - Coordination of Care []  - 0 Simple Patient / Family Education for ongoing care []  - 0 Complex (extensive) Patient / Family Education for ongoing care []  - 0 Staff obtains Programmer, systems, Records, T Results / Process Orders est []  - 0 Staff telephones HHA, Nursing Homes / Clarify orders / etc []  - 0 Routine Transfer to another Facility (non-emergent condition) []  - 0 Routine Hospital Admission (non-emergent condition) []  - 0 New Admissions / Biomedical engineer / Ordering NPWT Apligraf, etc. , []  - 0 Emergency Hospital Admission (emergent condition) PROCESS - Special Needs []  - 0 Pediatric / Minor Patient Management []  - 0 Isolation Patient Management []  - 0 Hearing / Language / Visual special needs []  - 0 Assessment of Community assistance (transportation, D/C planning, etc.) []  - 0 Additional assistance / Altered mentation []  - 0 Support Surface(s) Assessment (bed, cushion, seat, etc.) INTERVENTIONS - Miscellaneous []  - 0 External ear exam []  - 0 Patient Transfer (multiple staff / Civil Service fast streamer / Similar devices) []  -  0 Simple Staple / Suture removal (25 or less) []  - 0 Complex Staple / Suture removal (26 or more) []  - 0 Hypo/Hyperglycemic Management (do not check if billed separately) []  - 0 Ankle /  Brachial Index (ABI) - do not check if billed separately Has the patient been seen at the hospital within the last three years: Yes Total Score: 0 Level Of Care: ____ Electronic Signature(s) Signed: 02/19/2022 2:58:44 PM By: Yevonne Pax RN Entered By: Yevonne Pax on 02/19/2022 14:50:53 -------------------------------------------------------------------------------- Encounter Discharge Information Details Patient Name: Date of Service: Robert Levine, Robert Levine 02/19/2022 2:00 PM Medical Record Number: [id number] Patient Account Number: [id number] Date of Birth/Sex: Treating RN: [birth year] ([address] y.o. Robert Levine) Yevonne Pax [place], [place] ([id number]) [id number] Page 3 of 8 Primary Care Xavi Tomasik: SYSTEM, PCP Other Clinician: Referring Eiman Maret: Treating Alecxander Mainwaring/Extender: Silvestre Moment in Treatment: 5 Encounter Discharge Information Items Post Procedure Vitals Discharge Condition: Stable Temperature (F): 98 Ambulatory Status: Ambulatory Pulse (bpm): 72 Discharge Destination: Home Respiratory Rate (breaths/min): 18 Transportation: Private Auto Blood Pressure (mmHg): 164/84 Accompanied By: self Schedule Follow-up Appointment: Yes Clinical Summary of Care: Electronic Signature(s) Signed: 02/19/2022 2:52:38 PM By: Yevonne Pax RN Entered By: Yevonne Pax on 02/19/2022 14:52:38 -------------------------------------------------------------------------------- Lower Extremity Assessment Details Patient Name: Date of Service: Robert Levine [facility] 02/19/2022 2:00 PM Medical Record Number: [id number] Patient Account Number: [id number] Date of Birth/Sex: Treating RN: Dec 30, 1955 (66 y.o. Robert Levine) Yevonne Pax Primary Care Jeilani Grupe: SYSTEM, PCP Other Clinician: Referring  Marvyn Torrez: Treating Drayven Marchena/Extender: Silvestre Moment in Treatment: 5 Edema Assessment Assessed: [Left: No] [Right: No] Edema: [Left: Ye] [Right: s] Calf Left: Right: Point of Measurement: 42 cm From Medial Instep 44 cm Ankle Left: Right: Point of Measurement: 10 cm From Medial Instep 31 cm Electronic Signature(s) Signed: 02/19/2022 2:58:44 PM By: Yevonne Pax RN Entered By: Yevonne Pax on 02/19/2022 14:12:23 Multi Wound Chart Details -------------------------------------------------------------------------------- [name] ([id number]) [id number] Page 4 of 8 Patient Name: Date of Service: Robert Levine [facility] 02/19/2022 2:00 PM Medical Record Number: [id number] Patient Account Number: [id number] Date of Birth/Sex: Treating RN: 09-Sep-1955 (66 y.o. Robert Levine) Yevonne Pax Primary Care Zeda Gangwer: SYSTEM, PCP Other Clinician: Referring Zori Benbrook: Treating Dorothy Landgrebe/Extender: Silvestre Moment in Treatment: 5 Vital Signs Height(in): 76 Pulse(bpm): 72 Weight(lbs): 267 Blood Pressure(mmHg): 164/74 Body Mass Index(BMI): 32.5 Temperature(F): 98 Respiratory Rate(breaths/min): 18 Wound Assessments Wound Number: 1 N/A N/A Photos: N/A N/A Right Metatarsal head first N/A N/A Wound Location: Gradually Appeared N/A N/A Wounding Event: Diabetic Wound/Ulcer of the Lower N/A N/A Primary Etiology: Extremity Hypertension, Type II Diabetes N/A N/A Comorbid History: 04/02/2021 N/A N/A Date Acquired: 5 N/A N/A Weeks of Treatment: Open N/A N/A Wound Status: No N/A N/A Wound Recurrence: 0.3x0.2x0.2 N/A N/A Measurements L x W x D (cm) 0.047 N/A N/A A (cm) : rea 0.009 N/A N/A Volume (cm) : 92.60% N/A N/A % Reduction in A rea: 95.30% N/A N/A % Reduction in Volume: Grade 1 N/A N/A Classification: Medium N/A N/A Exudate A mount: Serosanguineous N/A N/A Exudate Type: red, brown N/A N/A Exudate Color: Medium (34-66%) N/A  N/A Granulation A mount: Medium (34-66%) N/A N/A Necrotic A mount: Fat Layer (Subcutaneous Tissue): Yes N/A N/A Exposed Structures: Fascia: No Tendon: No Muscle: No Joint: No Bone: No None N/A N/A Epithelialization: Treatment Notes Electronic Signature(s) Signed: 02/19/2022 2:58:44 PM By: Yevonne Pax RN Entered By: Yevonne Pax on 02/19/2022 14:12:49 -------------------------------------------------------------------------------- Multi-Disciplinary Care Plan Details Patient Name: Date of Service: Robert Levine, Robert Levine 02/19/2022 2:00 PM Medical Record Number: [id number] Patient Account Number: [id number] DONYA, TOMARO ([id number]) [phone].pdf Page 5 of 8 Date of Birth/Sex: Treating RN: 12-24-1955 (66 y.o. Robert Levine) Yevonne Pax Primary Care Ahad Colarusso: Other Clinician: SYSTEM, PCP Referring Kelicia Youtz: Treating  Tulio Facundo/Extender: Garry Heater in Treatment: 5 Active Inactive Nutrition Nursing Diagnoses: Impaired glucose control: actual or potential Goals: Patient/caregiver will maintain therapeutic glucose control Date Initiated: 01/15/2022 Target Resolution Date: 03/17/2022 Goal Status: Active Interventions: Assess HgA1c results as ordered upon admission and as needed Assess patient nutrition upon admission and as needed per policy Notes: Wound/Skin Impairment Nursing Diagnoses: Knowledge deficit related to ulceration/compromised skin integrity Goals: Patient/caregiver will verbalize understanding of skin care regimen Date Initiated: 01/15/2022 Target Resolution Date: 02/15/2022 Goal Status: Active Ulcer/skin breakdown will have a volume reduction of 30% by week 4 Date Initiated: 01/15/2022 Target Resolution Date: 03/17/2022 Goal Status: Active Ulcer/skin breakdown will have a volume reduction of 50% by week 8 Date Initiated: 01/15/2022 Target Resolution Date: 04/17/2022 Goal Status: Active Ulcer/skin breakdown will have a  volume reduction of 80% by week 12 Date Initiated: 01/15/2022 Target Resolution Date: 05/18/2022 Goal Status: Active Ulcer/skin breakdown will heal within 14 weeks Date Initiated: 01/15/2022 Target Resolution Date: 06/16/2022 Goal Status: Active Interventions: Assess patient/caregiver ability to obtain necessary supplies Assess patient/caregiver ability to perform ulcer/skin care regimen upon admission and as needed Assess ulceration(s) every visit Notes: Electronic Signature(s) Signed: 02/19/2022 2:58:44 PM By: Carlene Coria RN Entered By: Carlene Coria on 02/19/2022 14:12:42 -------------------------------------------------------------------------------- Pain Assessment Details Patient Name: Date of Service: Robert Levine [facility] 02/19/2022 2:00 PM Medical Record Number: [id number] Patient Account Number: [id number] Robert, Levine ([id number]) [phone].pdf Page 6 of 8 Date of Birth/Sex: Treating RN: [birth year] (66 y.o. [name]) Carlene Coria Primary Care Abagayle Klutts: SYSTEM, PCP Other Clinician: Referring Daanya Lanphier: Treating Damascus Feldpausch/Extender: Garry Heater in Treatment: 5 Active Problems Location of Pain Severity and Description of Pain Patient Has Paino No Site Locations Pain Management and Medication Current Pain Management: Electronic Signature(s) Signed: 02/19/2022 2:58:44 PM By: Carlene Coria RN Entered By: Carlene Coria on 02/19/2022 14:04:09 -------------------------------------------------------------------------------- Patient/Caregiver Education Details Patient Name: Date of Service: Robert Levine, Robert Levine 11/20/2023andnbsp2:00 PM Medical Record Number: [id number] Patient Account Number: [id number] Date of Birth/Gender: Treating RN: 1955-08-18 (66 y.o. [name]) Carlene Coria Primary Care Physician: SYSTEM, PCP Other Clinician: Referring Physician: Treating Physician/Extender: Garry Heater in Treatment: 5 Education  Assessment Education Provided To: Patient Education Topics Provided Wound/Skin Impairment: Methods: Explain/Verbal Responses: State content correctly Electronic Signature(s) Signed: 02/19/2022 2:58:44 PM By: Carlene Coria RN Scheryl Darter, Robert Levine ([id number]) [id number] Page 7 of 8 Entered By: Carlene Coria on 02/19/2022 14:51:08 -------------------------------------------------------------------------------- Wound Assessment Details Patient Name: Date of Service: Robert Levine [facility] 02/19/2022 2:00 PM Medical Record Number: [id number] Patient Account Number: [id number] Date of Birth/Sex: Treating RN: 11-09-1955 (66 y.o. [name]) Carlene Coria Primary Care Yuepheng Schaller: SYSTEM, PCP Other Clinician: Referring Naydeen Speirs: Treating Arshia Spellman/Extender: Garry Heater in Treatment: 5 Wound Status Wound Number: 1 Primary Etiology: Diabetic Wound/Ulcer of the Lower Extremity Wound Location: Right Metatarsal head first Wound Status: Open Wounding Event: Gradually Appeared Comorbid History: Hypertension, Type II Diabetes Date Acquired: 04/02/2021 Weeks Of Treatment: 5 Clustered Wound: No Photos Wound Measurements Length: (cm) 0.3 Width: (cm) 0.2 Depth: (cm) 0.2 Area: (cm) 0.047 Volume: (cm) 0.009 % Reduction in Area: 92.6% % Reduction in Volume: 95.3% Epithelialization: None Tunneling: No Undermining: No Wound Description Classification: Grade 1 Exudate Amount: Medium Exudate Type: Serosanguineous Exudate Color: red, brown Foul Odor After Cleansing: No Slough/Fibrino Yes Wound Bed Granulation Amount: Medium (34-66%) Exposed Structure Necrotic Amount: Medium (34-66%) Fascia Exposed: No Necrotic Quality: Adherent Slough Fat Layer (Subcutaneous Tissue) Exposed: Yes Tendon Exposed: No Muscle Exposed: No Joint Exposed: No  Bone Exposed: No Treatment Notes Wound #1 (Metatarsal head first) Wound Laterality: Right Cleanser Robert Levine, Robert Levine ([id number])  [id number] Page 8 of 8 Peri-Wound Care Topical Primary Dressing Hydrofera Blue Ready Transfer Foam, 2.5x2.5 (in/in) Discharge Instruction: Apply Hydrofera Blue Ready to wound bed as directed Secondary Dressing ABD Pad 5x9 (in/in) Discharge Instruction: Cover with ABD pad Secured With Medipore T - 31M Medipore H Soft Cloth Surgical T ape ape, 2x2 (in/yd) Compression Wrap Compression Stockings Add-Ons Electronic Signature(s) Signed: 02/19/2022 2:58:44 PM By: Carlene Coria RN Entered By: Carlene Coria on 02/19/2022 14:11:43 -------------------------------------------------------------------------------- Vitals Details Patient Name: Date of Service: Robert Levine, Robert Levine 02/19/2022 2:00 PM Medical Record Number: [id number] Patient Account Number: [id number] Date of Birth/Sex: Treating RN: January 12, 1956 (66 y.o. [name]) Carlene Coria Primary Care Haili Donofrio: SYSTEM, PCP Other Clinician: Referring Shalice Woodring: Treating Malijah Lietz/Extender: Garry Heater in Treatment: 5 Vital Signs Time Taken: 14:03 Temperature (F): 98 Height (in): 76 Pulse (bpm): 72 Weight (lbs): 267 Respiratory Rate (breaths/min): 18 Body Mass Index (BMI): 32.5 Blood Pressure (mmHg): 164/74 Reference Range: 80 - 120 mg / dl Electronic Signature(s) Signed: 02/19/2022 2:58:44 PM By: Carlene Coria RN Entered By: Carlene Coria on 02/19/2022 14:04:04

## 2022-02-19 NOTE — Progress Notes (Signed)
Robert Levine, Robert Levine ([id number]) [id number] Page 1 of 7 Visit Report for 02/19/2022 Chief Complaint Document Details Patient Name: Date of Service: Robert Levine [facility] 02/19/2022 2:00 PM Medical Record Number: [id number] Patient Account Number: [id number] Date of Birth/Sex: Treating RN: 26-Nov-1955 (66 y.o. Robert Levine) Carlene Coria Primary Care Provider: SYSTEM, PCP Other Clinician: Referring Provider: Treating Provider/Extender: Garry Heater in Treatment: 5 Information Obtained from: Patient Chief Complaint Right foot ulcer at amputation site Electronic Signature(s) Signed: 02/19/2022 1:54:59 PM By: Worthy Keeler PA-C Entered By: Worthy Keeler on 02/19/2022 13:54:58 -------------------------------------------------------------------------------- Debridement Details Patient Name: Date of Service: Robert Levine, Robert Levine [facility] 02/19/2022 2:00 PM Medical Record Number: [id number] Patient Account Number: [id number] Date of Birth/Sex: Treating RN: 1955/04/30 (66 y.o. Robert Levine) Carlene Coria Primary Care Provider: SYSTEM, PCP Other Clinician: Referring Provider: Treating Provider/Extender: Garry Heater in Treatment: 5 Debridement Performed for Assessment: Wound #1 Right Metatarsal head first Performed By: Physician Tommie Sams., PA-C Debridement Type: Debridement Severity of Tissue Pre Debridement: Fat layer exposed Level of Consciousness (Pre-procedure): Awake and Alert Pre-procedure Verification/Time Out Yes - 14:26 Taken: Start Time: 14:26 T Area Debrided (L x W): otal 0.5 (cm) x 0.5 (cm) = 0.25 (cm) Tissue and other material debrided: Viable, Non-Viable, Slough, Subcutaneous, Skin: Epidermis, Slough Level: Skin/Subcutaneous Tissue Debridement Description: Excisional Instrument: Curette Bleeding: Minimum Hemostasis Achieved: Pressure End Time: 14:29 Procedural Pain: 0 Post Procedural Pain: 0 Response to Treatment: Procedure was  tolerated well Vicenta Dunning ([id number]) [id number] Page 2 of 7 Level of Consciousness (Post- Awake and Alert procedure): Post Debridement Measurements of Total Wound Length: (cm) 0.3 Width: (cm) 0.2 Depth: (cm) 0.2 Volume: (cm) 0.009 Character of Wound/Ulcer Post Debridement: Improved Severity of Tissue Post Debridement: Fat layer exposed Post Procedure Diagnosis Same as Pre-procedure Electronic Signature(s) Signed: 02/19/2022 2:58:44 PM By: Carlene Coria RN Signed: 02/20/2022 8:14:16 AM By: Worthy Keeler PA-C Entered By: Carlene Coria on 02/19/2022 14:28:38 -------------------------------------------------------------------------------- HPI Details Patient Name: Date of Service: Robert Levine, Robert Levine [facility] 02/19/2022 2:00 PM Medical Record Number: [id number] Patient Account Number: [id number] Date of Birth/Sex: Treating RN: [birth year] (66 y.o. Robert Levine Primary Care Provider: SYSTEM, PCP Other Clinician: Referring Provider: Treating Provider/Extender: Garry Heater in Treatment: 5 History of Present Illness HPI Description: 01-15-2022 patient presents today as a referral from Dr. Caroline More who is a local podiatrist due to a foot wound that is not getting better. The patient unfortunately has had this going on for quite some time he is not necessarily the best historian which makes it a little bit difficult but nonetheless I did review notes as well together where things stand what is going on right now has been using Vaseline over the area according to what he tells me he has had debridements with Dr. Luana Shu intermittently. With that being said the patient has not really been using his offloading shoe as much she does have diabetic shoes and he tells me that is primarily what has been wearing. 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 He also has high blood pressure. 01-25-2022 upon evaluation today patient's wound actually showing signs of improvement though there is still little bit of callus and some biofilm and slough buildup on the surface of the wound. I Minna perform sharp debridement to clear this away today. He tells me has been using  the shoe a lot of the time though sometimes when he is at the assisted living facility he just walks around with a sock not using the shoe. I explained that he needs to make sure to always have the shoe on when he is up and moving around as can help this to heal most effectively. 02-01-2022 upon evaluation today patient appears to be doing well currently in regard to his wound from the standpoint of this not getting worse it does not look to be infected. With that being said unfortunately he does have an open wound that is going to require some sharp debridement today and again I am concerned that if we do not get this closed he is going to end up with this worsening in general. He has not really been wearing the postop shoe with front offloading unfortunately which means that he is not really getting much better. I think that a total contact cast is probably going to be the method in which to try to get this healed as quickly as possible. Again the sooner we get healed lessen the chance that this will develop into a more significant ulceration requiring any type of surgery. We especially would avoid any chance for amputation. 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 Here for application of TCC #1 [id number]; obligatory first total contact cast change. The patient had no particular problems. TCC reapplied 02-15-2022 upon evaluation today patient appears to be doing well currently in regard to his wound. There is some callus however buildup that is going to need to be cleared away. Fortunately there does not appear to be any signs  of infection this is actually his third cast application today although the first 2 were last week and this is already quite a bit smaller. 02-19-2022 upon evaluation today patient's wound actually is showing signs of doing well with the cast he is actually making good progress. Fortunately I do not see any evidence of infection locally or systemically at this time which is great news overall I think he is on the right track. Electronic Signature(s) Signed: 02/19/2022 2:47:40 PM By: Darlina Rumpf, Iona Beard ([id number]) PM By: Worthy Keeler PA-C [phone].pdf Page 3 of 7 Signed: 02/19/2022 2:47:40 Entered By: Worthy Keeler on 02/19/2022 14:47:40 -------------------------------------------------------------------------------- Physical Exam Details Patient Name: Date of Service: Robert Levine [facility] 02/19/2022 2:00 PM Medical Record Number: [id number] Patient Account Number: [id number] Date of Birth/Sex: Treating RN: 04-25-1955 (66 y.o. Robert Levine) Carlene Coria Primary Care Provider: SYSTEM, PCP Other Clinician: Referring Provider: Treating Provider/Extender: Garry Heater in Treatment: 5 Constitutional Well-nourished and well-hydrated in no acute distress. Respiratory normal breathing without difficulty. Psychiatric this patient is able to make decisions and demonstrates good insight into disease process. Alert and Oriented x 3. pleasant and cooperative. Notes Patient's wound bed actually showed signs of good granulation and epithelization at this point. I did perform sharp debridement clearway some of the necrotic debris tolerated that today without complication postdebridement the wound bed appears to be doing much better. Electronic Signature(s) Signed: 02/19/2022 2:48:00 PM By: Worthy Keeler PA-C Entered By: Worthy Keeler on 02/19/2022  14:48:00 -------------------------------------------------------------------------------- Physician Orders Details Patient Name: Date of Service: Robert Levine, Robert Levine [facility] 02/19/2022 2:00 PM Medical Record Number: [id number] Patient Account Number: [id number] Date of Birth/Sex: Treating RN: 09-Mar-1956 (66 y.o. 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 Bathing/ Shower/ Hygiene Clean wound with Normal Saline or wound cleanser. May shower with wound dressing protected with water repellent cover or cast protector. No tub bath. Anesthetic (Use 'Patient Medications' Section for Anesthetic Order Entry) Lidocaine applied to wound bed Edema Control - Lymphedema / Segmental Compressive Device / Other Elevate, Exercise Daily and A void Standing for Long Periods of Time. Elevate legs to the level of the heart and pump ankles as often as possible Elevate leg(s) parallel to the floor when sitting. Off-Loading Total Contact Cast to Right Lower Extremity - TCC # 4 appiled size 3 Wound Treatment Wound #1 - Metatarsal head first Wound Laterality: Right Prim Dressing: Hydrofera Blue Ready Transfer Foam, 2.5x2.5 (in/in) 1 x Per Week/30 Days ary Discharge Instructions: Apply Hydrofera Blue Ready to wound bed as directed Secondary Dressing: ABD Pad 5x9 (in/in) 1 x Per Week/30 Days Discharge Instructions: Cover with ABD  pad Secured With: Medipore T - 74M Medipore H Soft Cloth Surgical T ape ape, 2x2 (in/yd) 1 x Per Week/30 Days Electronic Signature(s) Signed: 02/19/2022 2:58:44 PM By: Yevonne Pax RN Signed: 02/20/2022 8:14:16 AM By: Lenda Kelp PA-C Entered By: Yevonne Pax on 02/19/2022 14:26:07 -------------------------------------------------------------------------------- Problem List Details Patient Name: Date of Service: Robert Levine, Robert Levine [facility] 02/19/2022 2:00 PM Medical Record Number: [id number] Patient Account Number: [id number] Date of Birth/Sex: Treating RN: [birth year] (66 y.o. 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 01/15/2022 No Yes Robert Levine, Robert Levine ([id number]) [id number] Page 5 of 7 N18.30 Chronic kidney disease, stage 3 unspecified 01/15/2022 No Yes I10 Essential (primary) hypertension 01/15/2022 No Yes Inactive Problems Resolved Problems Electronic Signature(s) Signed: 02/19/2022 1:54:50 PM By: Lenda Kelp PA-C Entered By: Lenda Kelp on 02/19/2022 13:54:50 -------------------------------------------------------------------------------- Progress Note Details Patient Name: Date of Service: Robert Levine, Robert Levine [facility] 02/19/2022 2:00 PM Medical Record Number: [id number] Patient Account Number: [id number] Date of Birth/Sex: Treating RN: 02-12-1956 (66 y.o. Robert Levine) Yevonne Pax Primary Care Provider: SYSTEM, PCP Other Clinician: Referring Provider: Treating Provider/Extender: Silvestre Moment in Treatment: 5 Subjective Chief  Complaint Information obtained from Patient Right foot ulcer at amputation site History of Present Illness (HPI) 01-15-2022 patient presents today as a referral from Dr. Rosetta Posner who is a local podiatrist due to a foot wound that is not getting better. The patient unfortunately has had this going on for quite some time he is not necessarily the best historian which makes it a little bit difficult but nonetheless I did review notes as well together where things stand what is going on right now has been using Vaseline over the area according to what he tells me he has had debridements with Dr. Excell Seltzer intermittently. With that being said the patient has not really been using his offloading shoe as much she does have  diabetic shoes and he tells me that is primarily what has been wearing. 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 He also has high blood pressure. 01-25-2022 upon evaluation today patient's wound actually showing signs of improvement though there is still little bit of callus and some biofilm and slough buildup on the surface of the wound. I Minna perform sharp debridement to clear this away today. He tells me has been using the shoe a lot of the time though sometimes when he is at the assisted living facility he just walks around with a sock not using the shoe. I explained that he needs to make sure to always have the shoe on when he is up and moving around as can help this to heal most effectively. 02-01-2022 upon evaluation today patient appears to be doing well currently in regard to his wound from the standpoint of this not getting worse it does not look to be infected. With that being said unfortunately he does have an open wound that is going to require some sharp debridement today and again I am concerned that if we do not get  this closed he is going to end up with this worsening in general. He has not really been wearing the postop shoe with front offloading unfortunately which means that he is not really getting much better. I think that a total contact cast is probably going to be the method in which to try to get this healed as quickly as possible. Again the sooner we get healed lessen the chance that this will develop into a more significant ulceration requiring any type of surgery. We especially would avoid any chance for amputation. 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 Here for application of TCC #1 [id number]; obligatory first total contact cast change. The patient had no particular problems. TCC reapplied 02-15-2022 upon evaluation today patient appears to be doing well currently in regard to his wound. There is some callus however buildup that is going to need to be cleared away. Fortunately there does not appear to be any signs of infection this is actually his third cast application today although the first 2 were last week and this is already quite a bit smaller. 02-19-2022 upon evaluation today patient's wound actually is showing signs of doing well with the cast he is actually making good progress. Fortunately I do not see any evidence of infection locally or systemically at this time which is great news overall I think he is on the right track. Robert Levine, Robert Levine ([id number]) [id number] Page 6 of 7 Objective Constitutional Well-nourished and well-hydrated in no acute distress. Vitals Time Taken: 2:03 PM, Height: 76 in, Weight: 267 lbs, BMI: 32.5, Temperature: 98 F, Pulse: 72 bpm, Respiratory Rate: 18 breaths/min, Blood Pressure: 164/74 mmHg. Respiratory normal breathing without difficulty. Psychiatric this patient is able to make decisions and demonstrates good insight into disease process. Alert and Oriented x 3. pleasant and cooperative. General Notes: Patient's  wound bed actually showed signs of good granulation and epithelization at this point. I did perform sharp debridement clearway some of the necrotic debris tolerated that today without complication postdebridement the wound bed appears to be doing much better. Integumentary (Hair, Skin) Wound #1 status is Open. Original cause of wound was Gradually Appeared. The date acquired was: 04/02/2021. The wound has been in treatment 5 weeks. The wound  is located on the Right Metatarsal head first. The wound measures 0.3cm length x 0.2cm width x 0.2cm depth; 0.047cm^2 area and 0.009cm^3 volume. There is Fat Layer (Subcutaneous Tissue) exposed. There is no tunneling or undermining noted. There is a medium amount of serosanguineous drainage noted. There is medium (34-66%) granulation within the wound bed. There is a medium (34-66%) amount of necrotic tissue within the wound bed including Adherent Slough. 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 There was a Excisional Skin/Subcutaneous Tissue Debridement with a total area of 0.25 sq cm performed by Tommie Sams., PA-C. With the following instrument(s): Curette to remove Viable and Non-Viable tissue/material. Material removed includes Subcutaneous Tissue, Slough, and Skin: Epidermis. No specimens were taken. A time out was conducted at 14:26, prior to the start of the procedure. A Minimum amount of bleeding was controlled with Pressure. The procedure was tolerated well with a pain level of 0 throughout and a pain level of 0 following  the procedure. Post Debridement Measurements: 0.3cm length x 0.2cm width x 0.2cm depth; 0.009cm^3 volume. Character of Wound/Ulcer Post Debridement is improved. Severity of Tissue Post Debridement is: Fat layer exposed. Post procedure Diagnosis Wound #1: Same as Pre-Procedure Plan Follow-up Appointments: Return Appointment in 1 week. Bathing/ Shower/ Hygiene: Clean wound with Normal Saline or wound cleanser. May shower with wound dressing protected with water repellent cover or cast protector. No tub bath. Anesthetic (Use 'Patient Medications' Section for Anesthetic Order Entry): Lidocaine applied to wound bed Edema Control - Lymphedema / Segmental Compressive Device / Other: Elevate, Exercise Daily and Avoid Standing for Long Periods of Time. Elevate legs to the level of the heart and pump ankles as often as possible Elevate leg(s) parallel to the floor when sitting. Off-Loading: T Contact Cast to Right Lower Extremity - TCC # 4 appiled size [address] Robert Levine, Robert Levine ([id number]) [id number] Page 7 of 7 WOUND #1: - Metatarsal head first Wound Laterality: Right Prim Dressing: Hydrofera Blue Ready Transfer Foam, 2.5x2.5 (in/in) 1 x Per Week/30 Days ary Discharge Instructions: Apply Hydrofera Blue Ready to wound bed as directed Secondary Dressing: ABD Pad 5x9 (in/in) 1 x Per Week/30 Days Discharge Instructions: Cover with ABD pad Secured With: Medipore T - 11M Medipore H Soft Cloth Surgical T ape ape, 2x2 (in/yd) 1 x Per Week/30 Days 1. I am going to suggest that we have the patient continue to monitor for any signs of infection or worsening. Also if anything changes he knows and let me know such as increased pain but I do believe that we should reapply the cast. He is in agreement that plan. I did switch him to a 3 though instead of 4 I think before was too big for him he was right on the brink between the 2 were either could be used at 17 cm the 3 for him much  better today. 2. I am also can recommend that we continue with the [facility] which I do believe is doing excellent at this point. We will see patient back for reevaluation in 1 week here in the clinic. If anything worsens or changes patient will contact  our office for additional recommendations. Electronic Signature(s) Signed: 02/19/2022 2:48:36 PM By: Worthy Keeler PA-C Entered By: Worthy Keeler on 02/19/2022 14:48:36 -------------------------------------------------------------------------------- SuperBill Details Patient Name: Date of Service: Robert Levine, Robert Levine [facility] 02/19/2022 Medical Record Number: [id number] Patient Account Number: [id number] Date of Birth/Sex: Treating RN: [birth year] (66 y.o. 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 N18.30 Chronic kidney disease, stage 3 unspecified I10 Essential (primary) hypertension Facility Procedures : CPT4 Code: [id number] Description: [id number] - DEB SUBQ TISSUE 20 SQ CM/< ICD-10 Diagnosis Description L97.512 Non-pressure chronic ulcer of other part of right foot with fat layer exposed Modifier: Quantity: 1 Physician Procedures : CPT4 Code Description Modifier [id number] - WC PHYS SUBQ TISS 20 SQ CM ICD-10 Diagnosis Description L97.512 Non-pressure chronic ulcer of other part of right foot with fat layer exposed Quantity: 1 Electronic Signature(s) Signed: 02/19/2022 2:48:42 PM By: Worthy Keeler PA-C Entered By: Worthy Keeler on 02/19/2022 14:48:42

## 2022-02-26 ENCOUNTER — Encounter: Payer: Medicare Other | Admitting: Physician Assistant

## 2022-02-26 DIAGNOSIS — E11621 Type 2 diabetes mellitus with foot ulcer: Secondary | ICD-10-CM | POA: Diagnosis not present

## 2022-02-27 NOTE — Progress Notes (Signed)
TAKI, CURRIER ([id number]) [id number] Page 1 of 6 Visit Report for 02/26/2022 Chief Complaint Document Details Patient Name: Date of Service: Robert Levine [facility] 02/26/2022 8:00 [place] Record Number: [id number] Patient Account Number: [id number] Date of Birth/Sex: Treating RN: 1956/03/29 (66 y.o. [name]) Carlene Coria Primary Care Provider: SYSTEM, PCP Other Clinician: Referring Provider: Treating Provider/Extender: Garry Heater in Treatment: 6 Information Obtained from: Patient Chief Complaint Right foot ulcer at amputation site Electronic Signature(s) Signed: 02/26/2022 8:36:56 AM By: Worthy Keeler PA-C Entered By: Worthy Keeler on 02/26/2022 08:36:56 -------------------------------------------------------------------------------- HPI Details Patient Name: Date of Service: Robert Levine, [name] [facility] 02/26/2022 8:00 [place] Record Number: [id number] Patient Account Number: [id number] Date of Birth/Sex: Treating RN: [birth year] (66 y.o. [name]) Carlene Coria Primary Care Provider: SYSTEM, PCP Other Clinician: Referring Provider: Treating Provider/Extender: Garry Heater in Treatment: 6 History of Present Illness HPI Description: 01-15-2022 patient presents today as a referral from Dr. Caroline More who is a local podiatrist due to a foot wound that is not getting better. The patient unfortunately has had this going on for quite some time he is not necessarily the best historian which makes it a little bit difficult but nonetheless I did review notes as well together where things stand what is going on right now has been using Vaseline over the area according to what he tells me he has had debridements with Dr. Luana Shu intermittently. With that being said the patient has not really been using his offloading shoe as much she does have diabetic shoes and he tells me that is primarily what has been wearing. 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 He also has high blood pressure. 01-25-2022 upon evaluation today patient's wound actually showing signs of improvement though there is still little bit of callus and some biofilm and slough buildup on the surface of the wound. I Minna perform sharp debridement to clear this away today. He tells me has been using the shoe a lot of the time though sometimes when he is at the assisted living facility he just walks around with a sock not using the shoe. I explained that he needs to make sure to always have the shoe on when he is up and moving around as can help this to heal most effectively. 02-01-2022 upon evaluation today patient appears to be doing well currently in regard to his wound from the standpoint of this not getting worse it does not look to be infected. With that being said unfortunately he does have an open wound that is going to require some sharp debridement today and again I am concerned that if we do not get this closed he is going to end up with this worsening in general. He has not really been wearing the postop shoe with front DELPHIS, BORAK ([id number]) [id number] Page 2 of 6 offloading unfortunately which means that he is not really getting much better. I think that a total contact cast is probably going to be the method in which to try to get this healed as quickly as possible. Again the sooner we get healed lessen the chance that this will develop into a more significant ulceration requiring any type of surgery. We especially would avoid any chance for amputation. 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 Here for application of TCC #  1 11/9; obligatory first total contact cast change. The patient had no particular problems. TCC reapplied 02-15-2022 upon evaluation  today patient appears to be doing well currently in regard to his wound. There is some callus however buildup that is going to need to be cleared away. Fortunately there does not appear to be any signs of infection this is actually his third cast application today although the first 2 were last week and this is already quite a bit smaller. 02-19-2022 upon evaluation today patient's wound actually is showing signs of doing well with the cast he is actually making good progress. Fortunately I do not see any evidence of infection locally or systemically at this time which is great news overall I think he is on the right track. 02-26-2022 upon evaluation today patient actually appears to be completely healed which is great news. Fortunately I see no evidence of active infection at this time locally or systemically which is great news as well. Electronic Signature(s) Signed: 02/26/2022 9:29:59 AM By: Lenda Kelp PA-C Entered By: Lenda Kelp on 02/26/2022 09:29:59 -------------------------------------------------------------------------------- Physical Exam Details Patient Name: Date of Service: Robert Levine [facility] 02/26/2022 8:00 A M Medical Record Number: [id number] Patient Account Number: [id number] Date of Birth/Sex: Treating RN: 1955/08/07 (66 y.o. Robert Levine) Yevonne Pax Primary Care Provider: SYSTEM, PCP Other Clinician: Referring Provider: Treating Provider/Extender: Silvestre Moment in Treatment: 6 Constitutional Well-nourished and well-hydrated in no acute distress. Respiratory normal breathing without difficulty. Psychiatric this patient is able to make decisions and demonstrates good insight into disease process. Alert and Oriented x 3. pleasant and cooperative. Notes Upon inspection patient's wound bed actually showed signs of complete epithelization which is great news and overall I am extremely pleased with where we stand I do believe that he is headed in the right  direction here. Electronic Signature(s) Signed: 02/26/2022 9:30:16 AM By: Lenda Kelp PA-C Entered By: Lenda Kelp on 02/26/2022 09:30:16 Physician Orders Details -------------------------------------------------------------------------------- [name] ([id number]) [id number] Page 3 of 6 Patient Name: Date of Service: Robert Levine [facility] 02/26/2022 8:00 A M Medical Record Number: [id number] Patient Account Number: [id number] Date of Birth/Sex: Treating RN: April 14, 1955 (66 y.o. Robert Levine) Yevonne Pax Primary Care Provider: SYSTEM, PCP Other Clinician: Referring Provider: Treating Provider/Extender: Silvestre Moment in Treatment: 6 Verbal / Phone Orders: No Diagnosis Coding Discharge From [facility] Services Discharge from [facility] Treatment Complete - apply AandD ointment Wear compression garments daily. Put garments on first thing when you wake up and remove them before bed. Moisturize legs daily after removing compression garments. - apply AandD ointment Electronic Signature(s) Signed: 02/26/2022 4:14:29 PM By: Lenda Kelp PA-C Signed: 02/27/2022 4:00:23 PM By: Yevonne Pax RN Entered By: Yevonne Pax on 02/26/2022 08:26:31 -------------------------------------------------------------------------------- Problem List Details Patient Name: Date of Service: Robert Levine, [name] Robert Levine 02/26/2022 8:00 A M Medical Record Number: [id number] Patient Account Number: [id number] Date of Birth/Sex: Treating RN: 1955/12/19 (66 y.o. Robert Levine) Yevonne Pax Primary Care Provider: SYSTEM, PCP Other Clinician: Referring Provider: Treating Provider/Extender: Silvestre Moment in Treatment: 6 Active Problems ICD-10 Encounter Code Description Active Date MDM Diagnosis E11.621 Type 2 diabetes mellitus with foot ulcer 01/15/2022 No Yes L97.512 Non-pressure chronic ulcer of other part of right foot with fat layer exposed 01/15/2022 No  Yes Z89.411 Acquired absence of right great toe 01/15/2022 No Yes N18.30 Chronic kidney disease, stage 3 unspecified 01/15/2022 No Yes I10 Essential (primary) hypertension 01/15/2022 No Yes Inactive Problems Resolved Problems  KEEDEN, LANGENBACH ([id number]) [id number] Page 4 of 6 Electronic Signature(s) Signed: 02/26/2022 8:36:54 AM By: Worthy Keeler PA-C Entered By: Worthy Keeler on 02/26/2022 08:36:53 -------------------------------------------------------------------------------- Progress Note Details Patient Name: Date of Service: Robert Levine, [name] [facility] 02/26/2022 8:00 A M Medical Record Number: [id number] Patient Account Number: [id number] Date of Birth/Sex: Treating RN: 09-23-1955 (66 y.o. [name]) Carlene Coria Primary Care Provider: SYSTEM, PCP Other Clinician: Referring Provider: Treating Provider/Extender: Garry Heater in Treatment: 6 Subjective Chief Complaint Information obtained from Patient Right foot ulcer at amputation site History of Present Illness (HPI) 01-15-2022 patient presents today as a referral from Dr. Caroline More who is a local podiatrist due to a foot wound that is not getting better. The patient unfortunately has had this going on for quite some time he is not necessarily the best historian which makes it a little bit difficult but nonetheless I did review notes as well together where things stand what is going on right now has been using Vaseline over the area according to what he tells me he has had debridements with Dr. Luana Shu intermittently. With that being said the patient has not really been using his offloading shoe as much she does have diabetic shoes and he tells me that is primarily what has been wearing. 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 He also has  high blood pressure. 01-25-2022 upon evaluation today patient's wound actually showing signs of improvement though there is still little bit of callus and some biofilm and slough buildup on the surface of the wound. I Minna perform sharp debridement to clear this away today. He tells me has been using the shoe a lot of the time though sometimes when he is at the assisted living facility he just walks around with a sock not using the shoe. I explained that he needs to make sure to always have the shoe on when he is up and moving around as can help this to heal most effectively. 02-01-2022 upon evaluation today patient appears to be doing well currently in regard to his wound from the standpoint of this not getting worse it does not look to be infected. With that being said unfortunately he does have an open wound that is going to require some sharp debridement today and again I am concerned that if we do not get this closed he is going to end up with this worsening in general. He has not really been wearing the postop shoe with front offloading unfortunately which means that he is not really getting much better. I think that a total contact cast is probably going to be the method in which to try to get this healed as quickly as possible. Again the sooner we get healed lessen the chance that this will develop into a more significant ulceration requiring any type of surgery. We especially would avoid any chance for amputation. 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 Here for application of TCC #1 [id number]; obligatory first total contact cast change. The patient had no particular problems. TCC reapplied 02-15-2022 upon evaluation today patient appears to be doing well currently in regard to his wound. There is some callus however buildup that is going to need to be cleared away. Fortunately there does not appear to be any signs of infection this is actually his third cast application today  although  the first 2 were last week and this is already quite a bit smaller. 02-19-2022 upon evaluation today patient's wound actually is showing signs of doing well with the cast he is actually making good progress. Fortunately I do not see any evidence of infection locally or systemically at this time which is great news overall I think he is on the right track. 02-26-2022 upon evaluation today patient actually appears to be completely healed which is great news. Fortunately I see no evidence of active infection at this time locally or systemically which is great news as well. Objective Constitutional Well-nourished and well-hydrated in no acute distress. Vitals Time Taken: 8:09 AM, Height: 76 in, Weight: 267 lbs, BMI: 32.5, Temperature: 97.8 F, Pulse: 77 bpm, Respiratory Rate: 18 breaths/min, Blood Pressure: DARRYON, GUARINI ([id number]) [id number] Page 5 of 6 154/77 mmHg. Respiratory normal breathing without difficulty. Psychiatric this patient is able to make decisions and demonstrates good insight into disease process. Alert and Oriented x 3. pleasant and cooperative. General Notes: Upon inspection patient's wound bed actually showed signs of complete epithelization which is great news and overall I am extremely pleased with where we stand I do believe that he is headed in the right direction here. Integumentary (Hair, Skin) Wound #1 status is Healed - Epithelialized. Original cause of wound was Gradually Appeared. The date acquired was: 04/02/2021. The wound has been in treatment 6 weeks. The wound is located on the Right Metatarsal head first. The wound measures 0cm length x 0cm width x 0cm depth; 0cm^2 area and 0cm^3 volume. There is no tunneling or undermining noted. There is a none present amount of drainage noted. There is no granulation within the wound bed. There is no necrotic tissue within the wound bed. 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 Put garments on first thing when you wake up and remove them before bed. Moisturize legs daily after removing compression garments. - apply AandD ointment 1. I am going to recommend that we have the patient continue with the wound care measures as before and he is in agreement with the plan this includes the use of the appropriate careful monitoring of his foot I do not think he needs the cast any longer but he is good to go back into his shoe. 2. I am good recommend as well that he continue to monitor for any evidence of worsening infection if anything changes he knows contact the office and let me know. I would recommend AandD ointment to his feet. 3. I would also suggest that he should be wearing compression garments we given measurements for that today. We will see patient back for reevaluation in 1 we will see the patient back for follow-up visit as needed. Electronic Signature(s) Signed: 02/26/2022 9:30:55 AM By: Worthy Keeler PA-C Entered By: Worthy Keeler on 02/26/2022 09:30:55 -------------------------------------------------------------------------------- SuperBill Details Patient Name: Date of Service: Robert Levine, [name] [facility] 02/26/2022 Medical Record Number: [id number] Patient Account Number: [id number] Date of Birth/Sex: Treating RN: 1955/12/22 (66 y.o. [name]) Carlene Coria Primary Care Provider: SYSTEM, PCP Other Clinician: Referring Provider: Treating Provider/Extender: Garry Heater in Treatment: 6 Vicenta Dunning ([id number]) [id number] Page 6 of 6 Diagnosis  Coding ICD-10  Codes Code Description E11.621 Type 2 diabetes mellitus with foot ulcer L97.512 Non-pressure chronic ulcer of other part of right foot with fat layer exposed Z89.411 Acquired absence of right great toe N18.30 Chronic kidney disease, stage 3 unspecified I10 Essential (primary) hypertension Facility Procedures : CPT4 Code: [id number] Description: [id number] - WOUND CARE VISIT-LEV 2 EST PT Modifier: Quantity: 1 Physician Procedures : CPT4 Code Description Modifier [id number] - WC PHYS LEVEL 3 - EST PT ICD-10 Diagnosis Description E11.621 Type 2 diabetes mellitus with foot ulcer L97.512 Non-pressure chronic ulcer of other part of right foot with fat layer exposed Z89.411 Acquired  absence of right great toe N18.30 Chronic kidney disease, stage 3 unspecified Quantity: 1 Electronic Signature(s) Signed: 02/26/2022 9:31:23 AM By: Worthy Keeler PA-C Entered By: Worthy Keeler on 02/26/2022 09:31:23

## 2022-02-27 NOTE — Progress Notes (Signed)
KAREY, STUCKI ([id number]) [id number] Page 1 of 8 Visit Report for 02/26/2022 Arrival Information Details Patient Name: Date of Service: [name] [facility] 02/26/2022 8:00 A M Medical Record Number: [id number] Patient Account Number: [id number] Date of Birth/Sex: Treating RN: 10/14/1955 (66 y.o. [name]) Yevonne Pax Primary Care Gerardine Peltz: SYSTEM, PCP Other Clinician: Referring Onyx Edgley: Treating Lisseth Brazeau/Extender: Silvestre Moment in Treatment: 6 Visit Information History Since Last Visit All ordered tests and consults were completed: No Patient Arrived: [name] Added or deleted any medications: No Arrival Time: 08:04 Any new allergies or adverse reactions: No Accompanied By: self Had a fall or experienced change in No Transfer Assistance: None activities of daily living that may affect Patient Identification Verified: Yes risk of falls: Secondary Verification Process Completed: Yes Signs or symptoms of abuse/neglect since last visito No Patient Requires Transmission-Based Precautions: No Hospitalized since last visit: No Patient Has Alerts: Yes Implantable device outside of the clinic excluding No Patient Alerts: DIABETIC cellular tissue based products placed in the center NON COMPRESSABLE since last visit: Has Dressing in Place as Prescribed: Yes Has Footwear/Offloading in Place as Prescribed: Yes Left: T Contact Cast otal Pain Present Now: No Electronic Signature(s) Signed: 02/27/2022 4:00:23 PM By: Yevonne Pax RN Entered By: Yevonne Pax on 02/26/2022 08:09:13 -------------------------------------------------------------------------------- Clinic Level of Care Assessment Details Patient Name: Date of Service: [name] [facility] 02/26/2022 8:00 A M Medical Record Number: [id number] Patient Account Number: [id number] Date of Birth/Sex: Treating RN: 1955-10-04 (66 y.o. [name]) Yevonne Pax Primary Care Deldrick Linch: SYSTEM, PCP Other  Clinician: Referring Aryaa Bunting: Treating Ayona Yniguez/Extender: Silvestre Moment in Treatment: 6 Clinic Level of Care Assessment Items TOOL 4 Quantity Score X- 1 0 Use when only an EandM is performed on FOLLOW-UP visit ASSESSMENTS - Nursing Assessment / Reassessment X- 1 10 Reassessment of Co-morbidities (includes updates in patient status) GLOVER, CAPANO ([id number]) [id number] Page 2 of 8 X- 1 5 Reassessment of Adherence to Treatment Plan ASSESSMENTS - Wound and Skin A ssessment / Reassessment X - Simple Wound Assessment / Reassessment - one wound 1 5 []  - 0 Complex Wound Assessment / Reassessment - multiple wounds []  - 0 Dermatologic / Skin Assessment (not related to wound area) ASSESSMENTS - Focused Assessment []  - 0 Circumferential Edema Measurements - multi extremities []  - 0 Nutritional Assessment / Counseling / Intervention []  - 0 Lower Extremity Assessment (monofilament, tuning fork, pulses) []  - 0 Peripheral Arterial Disease Assessment (using hand held doppler) ASSESSMENTS - Ostomy and/or Continence Assessment and Care []  - 0 Incontinence Assessment and Management []  - 0 Ostomy Care Assessment and Management (repouching, etc.) PROCESS - Coordination of Care X - Simple Patient / Family Education for ongoing care 1 15 []  - 0 Complex (extensive) Patient / Family Education for ongoing care []  - 0 Staff obtains , Records, T Results / Process Orders est []  - 0 Staff telephones HHA, Nursing Homes / Clarify orders / etc []  - 0 Routine Transfer to another Facility (non-emergent condition) []  - 0 Routine Hospital Admission (non-emergent condition) []  - 0 New Admissions / / Ordering NPWT Apligraf, etc. , []  - 0 Emergency Hospital Admission (emergent condition) X- 1 10 Simple Discharge Coordination []  - 0 Complex (extensive) Discharge Coordination PROCESS - Special Needs []  - 0 Pediatric /  Minor Patient Management []  - 0 Isolation Patient Management []  - 0 Hearing / Language / Visual special needs []  - 0 Assessment of Community assistance (transportation, D/C planning, etc.) []  - 0 Additional assistance /  Altered mentation []  - 0 Support Surface(s) Assessment (bed, cushion, seat, etc.) INTERVENTIONS - Wound Cleansing / Measurement X - Simple Wound Cleansing - one wound 1 5 []  - 0 Complex Wound Cleansing - multiple wounds X- 1 5 Wound Imaging (photographs - any number of wounds) []  - 0 Wound Tracing (instead of photographs) X- 1 5 Simple Wound Measurement - one wound []  - 0 Complex Wound Measurement - multiple wounds INTERVENTIONS - Wound Dressings []  - 0 Small Wound Dressing one or multiple wounds []  - 0 Medium Wound Dressing one or multiple wounds []  - 0 Large Wound Dressing one or multiple wounds []  - 0 Application of Medications - topical []  - 0 Application of Medications - injection ( ) [id number] Page 3 of 8 INTERVENTIONS - Miscellaneous []  - 0 External ear exam []  - 0 Specimen Collection (cultures, biopsies, blood, body fluids, etc.) []  - 0 Specimen(s) / Culture(s) sent or taken to Lab for analysis []  - 0 Patient Transfer (multiple staff / Lift / Similar devices) []  - 0 Simple Staple / Suture removal (25 or less) []  - 0 Complex Staple / Suture removal (26 or more) []  - 0 Hypo / Hyperglycemic Management (close monitor of Blood Glucose) []  - 0 Ankle / Brachial Index (ABI) - do not check if billed separately X- 1 5 Vital Signs Has the patient been seen at the hospital within the last three years: Yes Total Score: 65 Level Of Care: New/Established - Level 2 Electronic Signature(s) Signed: 02/27/2022 4:00:23 PM By: RN Entered By: on 02/26/2022 08:26:54 -------------------------------------------------------------------------------- Encounter Discharge  Information Details Patient Name: Date of Service: , [name] 02/26/2022 8:00 A M Medical Record Number: [id number] Patient Account Number: [id number] Date of Birth/Sex: Treating RN: [birth year] (66 y.o. ) Primary Care Annmargaret Decaprio: SYSTEM, PCP Other Clinician: Referring Aanshi Batchelder: Treating Chalmer Zheng/Extender: Michiel Sites in Treatment: 6 Encounter Discharge Information Items Discharge Condition: Stable Ambulatory Status: Walker Discharge Destination: Home Transportation: Private Auto Accompanied By: self Schedule Follow-up Appointment: Yes Clinical Summary of Care: Electronic Signature(s) Signed: 02/27/2022 4:00:23 PM By: RN Entered By: on 02/26/2022 08:27:34 03/01/2022 (Yevonne Pax) [id number] Page 4 of 8 -------------------------------------------------------------------------------- Lower Extremity Assessment Details Patient Name: Date of Service: 02/28/2022 [facility] 02/26/2022 8:00 A M Medical Record Number: [id number] Patient Account Number: [id number] Date of Birth/Sex: Treating RN: [birth year] (66 y.o. [name]) Yevonne Pax Primary Care Dekota Shenk: SYSTEM, PCP Other Clinician: Referring Maximillion Gill: Treating Bowie Doiron/Extender: Silvestre Moment in Treatment: 6 Edema Assessment Assessed: [Left: No] [Right: No] Edema: [Left: Yes] [Right: Yes] Calf Left: Right: Point of Measurement: 42 cm From Medial Instep 42 cm 38 cm Ankle Left: Right: Point of Measurement: 10 cm From Medial Instep 30 cm 30 cm Knee To Floor Left: Right: From Medial Instep 52 cm 52 cm Vascular Assessment Pulses: Dorsalis Pedis Palpable: [Left:Yes] Electronic Signature(s) Signed: 02/27/2022 4:00:23 PM By: Yevonne Pax RN Entered By: Yevonne Pax on 02/26/2022 08:21:07 -------------------------------------------------------------------------------- Multi Wound Chart Details Patient Name: Date of  Service: [name], [name] 02/26/2022 8:00 A M Medical Record Number: [id number] Patient Account Number: [name] Date of Birth/Sex: Treating RN: 1955/12/17 (66 y.o. [id number]) [id number] Primary Care Ottie Tillery: SYSTEM, PCP Other Clinician: Referring Sherman Lipuma: Treating Lorisa Scheid/Extender: 02/16/1956 in Treatment: 6 Vital Signs Height(in): 76 Pulse(bpm): 77 Weight(lbs): 267 Blood Pressure(mmHg): 154/77 Body Mass Index(BMI): 32.5 Temperature(F): 97.8 Respiratory Rate(breaths/min): 18 [1:Photos:] [N/A:N/A] Right Metatarsal head first N/A  N/A Wound Location: Gradually Appeared N/A N/A Wounding Event: Diabetic Wound/Ulcer of the Lower N/A N/A Primary Etiology: Extremity Hypertension, Type II Diabetes N/A N/A Comorbid History: 04/02/2021 N/A N/A Date Acquired: 6 N/A N/A Weeks of Treatment: Open N/A N/A Wound Status: No N/A N/A Wound Recurrence: Grade 1 N/A N/A Classification: None Present N/A N/A Exudate A mount: None Present (0%) N/A N/A Granulation A mount: None Present (0%) N/A N/A Necrotic A mount: Fascia: No N/A N/A Exposed Structures: Fat Layer (Subcutaneous Tissue): No Tendon: No Muscle: No Joint: No Bone: No Large (67-100%) N/A N/A Epithelialization: Treatment Notes Electronic Signature(s) Signed: 02/27/2022 4:00:23 PM By: Yevonne PaxEpps, Carrie RN Entered By: Yevonne PaxEpps, Carrie on 02/26/2022 08:21:18 -------------------------------------------------------------------------------- Multi-Disciplinary Care Plan Details Patient Name: Date of Service: Claretta FraiseBURNHA M, [name] 02/26/20[facility] 8:00 A M Medical Record Number: [id number] Patient Account Number: [id number] Date of Birth/Sex: Treating RN: 26-Sep-1955 (66 y.o. Judie PetitM) Yevonne PaxEpps, Carrie Primary Care Enedelia Martorelli: SYSTEM, PCP Other Clinician: Referring Mihailo Sage: Treating Mahamed Zalewski/Extender: Silvestre MomentStone, Hoyt Baker, [name] in Treatment: 6 Active Inactive Electronic Signature(s) Signed: 02/27/2022 4:00:23 PM By: Yevonne PaxEpps,  Carrie RN Entered By: Yevonne PaxEpps, Carrie on 02/26/2022 08:24:46 Roxy CedarBURNHAM, Jameire ([id number]) [id number] Page 6 of 8 -------------------------------------------------------------------------------- Pain Assessment Details Patient Name: Date of Service: Monte FantasiaBURNHA M[name]26[facility] 8:00 A M Medical Record Number: [id number] Patient Account Number: [id number] Date of Birth/Sex: Treating RN: 26-Sep-1955 (66 y.o. Judie PetitM) Yevonne PaxEpps, Carrie Primary Care Carolos Fecher: SYSTEM, PCP Other Clinician: Referring Aubriauna Riner: Treating Tywanda Rice/Extender: Silvestre MomentStone, Hoyt Baker, [name] in Treatment: 6 Active Problems Location of Pain Severity and Description of Pain Patient Has Paino No Site Locations Pain Management and Medication Current Pain Management: Electronic Signature(s) Signed: 02/27/2022 4:00:23 PM By: Yevonne PaxEpps, Carrie RN Entered By: Yevonne PaxEpps, Carrie on 02/26/2022 08:09:34 -------------------------------------------------------------------------------- Patient/Caregiver Education Details Patient Name: Date of Service: Claretta FraiseBURNHA M, [name] 11/27/2023andnbsp8:00 A M Medical Record Number: [id number] Patient Account Number: [id number] Date of Birth/Gender: Treating RN: 26-Sep-1955 (66 y.o. Judie PetitM) Yevonne PaxEpps, Carrie Primary Care Physician: SYSTEM, PCP Other Clinician: Referring Physician: Treating Physician/Extender: Silvestre MomentStone, Hoyt Baker, [name] in Treatment: 6 Roxy CedarBURNHAM, Rudransh ([id number]) [id number] Page 7 of 8 Education Assessment Education Provided To: Patient Education Topics Provided Wound/Skin Impairment: Methods: Explain/Verbal Responses: State content correctly Electronic Signature(s) Signed: 02/27/2022 4:00:23 PM By: Yevonne PaxEpps, Carrie RN Entered By: Yevonne PaxEpps, Carrie on 02/26/2022 08:27:08 -------------------------------------------------------------------------------- Wound Assessment Details Patient Name: Date of Service: Claretta FraiseBURNHA M, [name] 02/26/2022 8:00 A M Medical  Record Number: [id number] Patient Account Number: [id number] Date of Birth/Sex: Treating RN: 26-Sep-1955 (66 y.o. Judie PetitM) Yevonne PaxEpps, Carrie Primary Care Blima Jaimes: SYSTEM, PCP Other Clinician: Referring Kamilia Carollo: Treating Estella Malatesta/Extender: Silvestre MomentStone, Hoyt Baker, [name] in Treatment: 6 Wound Status Wound Number: 1 Primary Etiology: Diabetic Wound/Ulcer of the Lower Extremity Wound Location: Right Metatarsal head first Wound Status: Healed - Epithelialized Wounding Event: Gradually Appeared Comorbid History: Hypertension, Type II Diabetes Date Acquired: 04/02/2021 Weeks Of Treatment: 6 Clustered Wound: No Photos Wound Measurements Length: (cm) Width: (cm) Depth: (cm) Area: (cm) Volume: (cm) 0 % Reduction in Area: 100% 0 % Reduction in Volume: 100% 0 Epithelialization: Large (67-100%) 0 Tunneling: No 0 Undermining: No Wound Description Classification: Grade 1 Exudate Amount: None Present Roxy CedarBURNHAM, Jafari ([id number]) Wound Bed Granulation Amount: None Present (0%) Necrotic Amount: None Present (0%) Foul Odor After Cleansing: No Slough/Fibrino No [id number] Page 8 of 8 Exposed Structure Fascia Exposed: No Fat Layer (Subcutaneous Tissue) Exposed: No Tendon Exposed: No Muscle Exposed: No Joint Exposed: No Bone Exposed: No Treatment Notes Wound #1 (Metatarsal head first) Wound Laterality: Right Cleanser Peri-Wound  Care Topical Primary Dressing Secondary Dressing Secured With Compression Wrap Compression Stockings Add-Ons Electronic Signature(s) Signed: 02/27/2022 4:00:23 PM By: Yevonne Pax RN Entered By: Yevonne Pax on 02/26/2022 08:24:27 -------------------------------------------------------------------------------- Vitals Details Patient Name: Date of Service: [name], [name] 02/26/2022 8:00 A M Medical Record Number: [id number] Patient Account Number: [id number] Date of Birth/Sex: Treating RN: 1955-11-24 (66 y.o. [name]) Yevonne Pax Primary Care Tymira Horkey: SYSTEM, PCP Other Clinician: Referring Oshae Simmering: Treating Trek Kimball/Extender: Silvestre Moment in Treatment: 6 Vital Signs Time Taken: 08:09 Temperature (F): 97.8 Height (in): 76 Pulse (bpm): 77 Weight (lbs): 267 Respiratory Rate (breaths/min): 18 Body Mass Index (BMI): 32.5 Blood Pressure (mmHg): 154/77 Reference Range: 80 - 120 mg / dl Electronic Signature(s) Signed: 02/27/2022 4:00:23 PM By: Yevonne Pax RN Entered By: Yevonne Pax on 02/26/2022 02:11:15

## 2022-03-05 ENCOUNTER — Ambulatory Visit: Payer: Medicare Other | Admitting: Physician Assistant

## 2022-04-28 ENCOUNTER — Other Ambulatory Visit: Payer: Self-pay

## 2022-04-28 DIAGNOSIS — E860 Dehydration: Secondary | ICD-10-CM | POA: Diagnosis not present

## 2022-04-28 DIAGNOSIS — R739 Hyperglycemia, unspecified: Secondary | ICD-10-CM | POA: Diagnosis present

## 2022-04-28 LAB — URINALYSIS, ROUTINE W REFLEX MICROSCOPIC
Bacteria, UA: NONE SEEN
Bilirubin Urine: NEGATIVE
Glucose, UA: >=500 mg/dL — AB
Ketones, ur: NEGATIVE mg/dL
Leukocytes,Ua: NEGATIVE
Nitrite: NEGATIVE
Protein, ur: 30 mg/dL — AB
Specific Gravity, Urine: 1.013 (ref 1.005–1.030)
Squamous Epithelial / HPF: NONE SEEN /HPF (ref 0–5)
pH: 6 (ref 5.0–8.0)

## 2022-04-28 LAB — CBC
HCT: 37 % — ABNORMAL LOW (ref 39.0–52.0)
Hemoglobin: 12.3 g/dL — ABNORMAL LOW (ref 13.0–17.0)
MCH: 30.1 pg (ref 26.0–34.0)
MCHC: 33.2 g/dL (ref 30.0–36.0)
MCV: 90.7 fL (ref 80.0–100.0)
Platelets: 211 10*3/uL (ref 150–400)
RBC: 4.08 MIL/uL — ABNORMAL LOW (ref 4.22–5.81)
RDW: 12.6 % (ref 11.5–15.5)
WBC: 6.8 10*3/uL (ref 4.0–10.5)
nRBC: 0 % (ref 0.0–0.2)

## 2022-04-28 LAB — CBG MONITORING, ED: Glucose-Capillary: 357 mg/dL — ABNORMAL HIGH (ref 70–99)

## 2022-04-28 LAB — BASIC METABOLIC PANEL
Anion gap: 8 (ref 5–15)
BUN: 32 mg/dL — ABNORMAL HIGH (ref 8–23)
CO2: 25 mmol/L (ref 22–32)
Calcium: 8.7 mg/dL — ABNORMAL LOW (ref 8.9–10.3)
Chloride: 98 mmol/L (ref 98–111)
Creatinine, Ser: 2.18 mg/dL — ABNORMAL HIGH (ref 0.61–1.24)
GFR, Estimated: 32 mL/min — ABNORMAL LOW (ref >60)
Glucose, Bld: 409 mg/dL — ABNORMAL HIGH (ref 70–99)
Potassium: 4.4 mmol/L (ref 3.5–5.1)
Sodium: 131 mmol/L — ABNORMAL LOW (ref 135–145)

## 2022-04-28 NOTE — ED Triage Notes (Signed)
Pt to ED from [facility] by EMS for hyperglycemia. Pt advised he doesn't know what kind of insulin he takes but he gets it 3x a day with meals. Today he got breakfast and lunch but didn't get dinner dose. Per staff , they are out of his prescribed insulin and had called it in to the pharmacy at 7pm and they were unable to get delivered at this. Pt is CAOx4 and in no acute distress,

## 2022-04-28 NOTE — ED Notes (Signed)
Current BGL 357

## 2022-04-28 NOTE — ED Triage Notes (Signed)
FIRST NURSE NOTE:  Pt arrived via [facility] from [facility], with CBG 515 with EMS, takes insulin in AM and did not get in evening dose.  18G LAC  500cc NS with EMS  VSS

## 2022-04-29 ENCOUNTER — Emergency Department
Admission: EM | Admit: 2022-04-29 | Discharge: 2022-04-29 | Disposition: A | Discharge status: Home / Self Care | Payer: Medicare Other | Attending: Emergency Medicine | Admitting: Emergency Medicine

## 2022-04-29 DIAGNOSIS — R739 Hyperglycemia, unspecified: Secondary | ICD-10-CM

## 2022-04-29 DIAGNOSIS — E860 Dehydration: Secondary | ICD-10-CM

## 2022-04-29 MED ORDER — INSULIN NPH (HUMAN) (ISOPHANE) 100 UNIT/ML ~~LOC~~ SUSP
30.0000 [IU] | SUBCUTANEOUS | Status: AC
  Administered 2022-04-29: 30 [IU] via SUBCUTANEOUS
  Filled 2022-04-29: qty 10

## 2022-04-29 MED ORDER — INSULIN ASPART 100 UNIT/ML IJ SOLN
3.0000 [IU] | Freq: Once | INTRAMUSCULAR | Status: AC
Start: 2022-04-29 — End: 2022-04-29
  Administered 2022-04-29: 3 [IU] via SUBCUTANEOUS
  Filled 2022-04-29: qty 1

## 2022-04-29 NOTE — ED Notes (Signed)
Discharge instructions explained to patient at this time. Patient states they understand and agree.   

## 2022-04-29 NOTE — ED Notes (Signed)
called to [facility] for transport to facility [facility]/rep:blanca.Marland Kitchen

## 2022-04-29 NOTE — ED Provider Notes (Signed)
[facility] Provider Note    Event Date/Time   First MD Initiated Contact with Patient 04/29/22 [phone]     (approximate)   History   Hyperglycemia   HPI  Robert Levine is a 67 y.o. male who presents for evaluation of hyperglycemia.  He comes from [facility] assisted living facility.  He is supposed to get long-acting insulin twice a day as well as short acting insulin 3 times a day.  Reportedly Robert Levine ran out of the patient's insulin (uncertain which kind) and was unable to get it from the pharmacy so they sent him to the ED.  He said that he feels fine.  He said "I can feel good when my sugar is high, and I can feel good when my sugar is low".  He admits that it is likely higher than usual tonight because he went to a get together in [facility] and ate a bunch of barbecue.  He said he does not feel dizzy or lightheaded, has no nausea or vomiting, no visual abnormalities, no chest pain and no shortness of breath.     Physical Exam   Triage Vital Signs: ED Triage Vitals [04/28/22 2258]  Enc Vitals Group     BP (!) 160/82     Pulse Rate 80     Resp 16     Temp 98 F (36.7 C)     Temp Source Oral     SpO2 99 %     Weight 121.1 kg (267 lb)     Height 1.956 m (6\' 5" )     Head Circumference      Peak Flow      Pain Score 0     Pain Loc      Pain Edu?      Excl. in [place]?     Most recent vital signs: Vitals:   04/29/22 0149 04/29/22 0151  BP: (!) 151/81 (!) 151/81  Pulse:  78  Resp:  18  Temp:  98.1 F (36.7 C)  SpO2:  98%     General: Awake, no distress.  CV:  Good peripheral perfusion.  Regular rate and rhythm. Resp:  Normal effort. Speaking easily and comfortably, no accessory muscle usage nor intercostal retractions.   Abd:  No distention.  Other:  No focal neurological deficits.  Normal mood and affect, pleasant and conversant, no concerns.   ED Results / Procedures / Treatments   Labs (all labs ordered are listed, but only  abnormal results are displayed) Labs Reviewed  BASIC METABOLIC PANEL - Abnormal; Notable for the following components:      Result Value   Sodium 131 (*)    Glucose, Bld 409 (*)    BUN 32 (*)    Creatinine, Ser 2.18 (*)    Calcium 8.7 (*)    GFR, Estimated 32 (*)    All other components within normal limits  CBC - Abnormal; Notable for the following components:   RBC 4.08 (*)    Hemoglobin 12.3 (*)    HCT 37.0 (*)    All other components within normal limits  URINALYSIS, ROUTINE W REFLEX MICROSCOPIC - Abnormal; Notable for the following components:   Color, Urine STRAW (*)    APPearance CLEAR (*)    Glucose, UA >=500 (*)    Hgb urine dipstick SMALL (*)    Protein, ur 30 (*)    All other components within normal limits  CBG MONITORING, ED - Abnormal; Notable for  the following components:   Glucose-Capillary 357 (*)    All other components within normal limits  CBG MONITORING, ED      PROCEDURES:  Critical Care performed: No  Procedures   MEDICATIONS ORDERED IN ED: Medications  insulin NPH Human (NOVOLIN N) injection 30 Units (30 Units Subcutaneous Given 04/29/22 0145)  insulin aspart (novoLOG) injection 3 Units (3 Units Subcutaneous Given 04/29/22 0120)     IMPRESSION / MDM / ASSESSMENT AND PLAN / ED COURSE  I reviewed the triage vital signs and the nursing notes.                              Differential diagnosis includes, but is not limited to, asymptomatic hyperglycemia, DKA, HHS, AKI, other electrolyte or metabolic abnormality.  Patient's presentation is most consistent with acute presentation with potential threat to life or bodily function.  Labs/studies ordered: BMP, CBC, CBG, urinalysis. Interventions/Medications given: Novolin N 30 units subcutaneous, NovoLog 3 units subcutaneous.  Patient's vital signs are stable and within normal limits.  Mental status is normal.  His creatinine is slightly elevated above his baseline which seems to be anywhere  between 1.4 and 1.9.  He said that he has an appointment coming up with his nephrologist soon.  He acknowledges that he has had some dietary indiscretions today but said that he is perfectly capable of drinking and more water.  He has had no increased output (vomiting or diarrhea).  I suggested we give him some IV fluids and he said that he does not want any.  He drank 2 cups of water (at our request) while he is here and he said he would just like to get his insulin and go back to his facility.  I think that is reasonable and he has a capacity to make his own decisions.  I checked his medical record and verified the dosing of his intermediate acting and fast acting insulin and ordered 30 units of the intermediate and 3 units of the fast acting.  Given that his blood glucose is 409 I think this is very reasonable.  The patient very much wants to go home and is appropriate to do so.  I will discharge him for outpatient follow-up.  I gave my usual and customary return precautions.      FINAL CLINICAL IMPRESSION(S) / ED DIAGNOSES   Final diagnoses:  Hyperglycemia  Dehydration     Rx / DC Orders   ED Discharge Orders     None        Note:  This document was prepared using Dragon voice recognition software and may include unintentional dictation errors.   Hinda Kehr, MD 04/29/22 [phone]

## 2022-04-29 NOTE — Discharge Instructions (Addendum)
We gave you a dose of your Novolin N (30 units) (the longer acting insulin) and 3 units of fast-acting insulin (Novolog).  Please drink plenty of water as your labs suggest that you are a little bit dehydrated but not severely so.  Continue on your regular medication regimen and follow-up with your regular doctor at the next available opportunity.

## 2022-05-31 ENCOUNTER — Encounter: Payer: Self-pay | Admitting: Ophthalmology

## 2022-06-05 NOTE — Discharge Instructions (Signed)

## 2022-06-07 ENCOUNTER — Encounter: Payer: Self-pay | Admitting: Ophthalmology

## 2022-06-07 ENCOUNTER — Ambulatory Visit: Payer: Medicare Other | Admitting: Anesthesiology

## 2022-06-07 ENCOUNTER — Ambulatory Visit
Admission: RE | Admit: 2022-06-07 | Discharge: 2022-06-07 | Disposition: A | Discharge status: Home / Self Care | Payer: Medicare Other | Attending: Ophthalmology | Admitting: Ophthalmology

## 2022-06-07 ENCOUNTER — Encounter
Admission: RE | Disposition: A | Discharge status: Home / Self Care | Payer: Self-pay | Source: Home / Self Care | Attending: Ophthalmology

## 2022-06-07 ENCOUNTER — Other Ambulatory Visit: Payer: Self-pay

## 2022-06-07 DIAGNOSIS — Z7984 Long term (current) use of oral hypoglycemic drugs: Secondary | ICD-10-CM | POA: Diagnosis not present

## 2022-06-07 DIAGNOSIS — I129 Hypertensive chronic kidney disease with stage 1 through stage 4 chronic kidney disease, or unspecified chronic kidney disease: Secondary | ICD-10-CM | POA: Insufficient documentation

## 2022-06-07 DIAGNOSIS — H2512 Age-related nuclear cataract, left eye: Secondary | ICD-10-CM | POA: Insufficient documentation

## 2022-06-07 DIAGNOSIS — E1136 Type 2 diabetes mellitus with diabetic cataract: Secondary | ICD-10-CM | POA: Insufficient documentation

## 2022-06-07 DIAGNOSIS — N183 Chronic kidney disease, stage 3 unspecified: Secondary | ICD-10-CM | POA: Insufficient documentation

## 2022-06-07 DIAGNOSIS — E1122 Type 2 diabetes mellitus with diabetic chronic kidney disease: Secondary | ICD-10-CM | POA: Insufficient documentation

## 2022-06-07 DIAGNOSIS — Z794 Long term (current) use of insulin: Secondary | ICD-10-CM | POA: Insufficient documentation

## 2022-06-07 HISTORY — DX: Gout, unspecified: M10.9

## 2022-06-07 HISTORY — DX: Chronic kidney disease, stage 3 unspecified: N18.30

## 2022-06-07 HISTORY — DX: Mild cognitive impairment of uncertain or unknown etiology: G31.84

## 2022-06-07 HISTORY — DX: Essential (primary) hypertension: I10

## 2022-06-07 HISTORY — DX: Presence of dental prosthetic device (complete) (partial): Z97.2

## 2022-06-07 HISTORY — DX: Depression, unspecified: F32.A

## 2022-06-07 HISTORY — PX: CATARACT EXTRACTION W/PHACO: SHX586

## 2022-06-07 LAB — GLUCOSE, CAPILLARY: Glucose-Capillary: 113 mg/dL — ABNORMAL HIGH (ref 70–99)

## 2022-06-07 SURGERY — PHACOEMULSIFICATION, CATARACT, WITH IOL INSERTION
Anesthesia: Monitor Anesthesia Care | Site: Eye | Laterality: Left

## 2022-06-07 MED ORDER — SIGHTPATH DOSE#1 NA HYALUR & NA CHOND-NA HYALUR IO KIT
PACK | INTRAOCULAR | Status: DC | PRN
  Administered 2022-06-07: 1 via OPHTHALMIC

## 2022-06-07 MED ORDER — BRIMONIDINE TARTRATE-TIMOLOL 0.2-0.5 % OP SOLN
OPHTHALMIC | Status: DC | PRN
  Administered 2022-06-07: 1 [drp] via OPHTHALMIC

## 2022-06-07 MED ORDER — TETRACAINE HCL 0.5 % OP SOLN
1.0000 [drp] | OPHTHALMIC | Status: DC | PRN
  Administered 2022-06-07 (×3): 1 [drp] via OPHTHALMIC

## 2022-06-07 MED ORDER — SIGHTPATH DOSE#1 BSS IO SOLN
INTRAOCULAR | Status: DC | PRN
  Administered 2022-06-07: 94 mL via OPHTHALMIC

## 2022-06-07 MED ORDER — LACTATED RINGERS IV SOLN: INTRAVENOUS | Status: DC

## 2022-06-07 MED ORDER — LIDOCAINE HCL (PF) 2 % IJ SOLN
INTRAOCULAR | Status: DC | PRN
  Administered 2022-06-07: 4 mL via INTRAOCULAR

## 2022-06-07 MED ORDER — MIDAZOLAM HCL 2 MG/2ML IJ SOLN
INTRAMUSCULAR | Status: DC | PRN
  Administered 2022-06-07 (×2): 1 mg via INTRAVENOUS

## 2022-06-07 MED ORDER — MOXIFLOXACIN HCL 0.5 % OP SOLN
OPHTHALMIC | Status: DC | PRN
  Administered 2022-06-07: .2 mL via OPHTHALMIC

## 2022-06-07 MED ORDER — ARMC OPHTHALMIC DILATING DROPS
1.0000 | OPHTHALMIC | Status: DC | PRN
  Administered 2022-06-07 (×3): 1 via OPHTHALMIC

## 2022-06-07 MED ORDER — FENTANYL CITRATE (PF) 100 MCG/2ML IJ SOLN
INTRAMUSCULAR | Status: DC | PRN
  Administered 2022-06-07: 50 ug via INTRAVENOUS

## 2022-06-07 MED ORDER — SIGHTPATH DOSE#1 BSS IO SOLN
INTRAOCULAR | Status: DC | PRN
  Administered 2022-06-07: 15 mL via INTRAOCULAR

## 2022-06-07 SURGICAL SUPPLY — 22 items
CANNULA ANT/CHMB 27G (MISCELLANEOUS) IMPLANT
CANNULA ANT/CHMB 27GA (MISCELLANEOUS) IMPLANT
CATARACT SUITE SIGHTPATH (MISCELLANEOUS) ×1 IMPLANT
DISSECTOR HYDRO NUCLEUS 50X22 (MISCELLANEOUS) ×1 IMPLANT
DRSG TEGADERM 2-3/8X2-3/4 SM (GAUZE/BANDAGES/DRESSINGS) ×1 IMPLANT
FEE CATARACT SUITE SIGHTPATH (MISCELLANEOUS) ×1 IMPLANT
GLOVE SURG SYN 7.5  E (GLOVE) ×1
GLOVE SURG SYN 7.5 E (GLOVE) ×1 IMPLANT
GLOVE SURG SYN 7.5 PF PI (GLOVE) ×1 IMPLANT
GLOVE SURG SYN 8.5  E (GLOVE) ×1
GLOVE SURG SYN 8.5 E (GLOVE) ×1 IMPLANT
GLOVE SURG SYN 8.5 PF PI (GLOVE) ×1 IMPLANT
LENS IOL TECNIS EYHANCE 23.5 (Intraocular Lens) IMPLANT
NDL FILTER BLUNT 18X1 1/2 (NEEDLE) IMPLANT
NEEDLE FILTER BLUNT 18X1 1/2 (NEEDLE) IMPLANT
PACK VIT ANT 23G (MISCELLANEOUS) IMPLANT
RING MALYGIN (MISCELLANEOUS) IMPLANT
SUT ETHILON 10-0 CS-B-6CS-B-6 (SUTURE)
SUTURE EHLN 10-0 CS-B-6CS-B-6 (SUTURE) IMPLANT
SYR 3ML LL SCALE MARK (SYRINGE) IMPLANT
SYR 5ML LL (SYRINGE) IMPLANT
WATER STERILE IRR 250ML POUR (IV SOLUTION) ×1 IMPLANT

## 2022-06-07 NOTE — Anesthesia Preprocedure Evaluation (Signed)
Anesthesia Evaluation  Patient identified by MRN, date of birth, ID band Patient awake    Reviewed: Allergy & Precautions, H&P , NPO status , Patient's Chart, lab work & pertinent test results, reviewed documented beta blocker date and time   History of Anesthesia Complications Negative for: history of anesthetic complications  Airway Mallampati: II  TM Distance: >3 FB Neck ROM: full    Dental no notable dental hx. (+) Caps, Dental Advidsory Given, Poor Dentition, Missing, Chipped   Pulmonary neg pulmonary ROS   Pulmonary exam normal breath sounds clear to auscultation       Cardiovascular Exercise Tolerance: Good hypertension, (-) angina (-) Past MI and (-) Cardiac Stents Normal cardiovascular exam(-) dysrhythmias (-) Valvular Problems/Murmurs Rhythm:regular Rate:Normal     Neuro/Psych  PSYCHIATRIC DISORDERS  Depression    negative neurological ROS     GI/Hepatic negative GI ROS, Neg liver ROS,,,  Endo/Other  diabetes    Renal/GU CRFRenal disease  negative genitourinary   Musculoskeletal   Abdominal   Peds  Hematology negative hematology ROS (+)   Anesthesia Other Findings Past Medical History: No date: CKD (chronic kidney disease), stage III (HCC) No date: Depression No date: Diabetes mellitus without complication (HCC) No date: Gout No date: Hypertension No date: Mild cognitive impairment No date: Wears dentures     Comment:  full upper   Reproductive/Obstetrics negative OB ROS                             Anesthesia Physical Anesthesia Plan  ASA: 3  Anesthesia Plan: MAC   Post-op Pain Management:    Induction: Intravenous  PONV Risk Score and Plan: 1 and Midazolam and Treatment may vary due to age or medical condition  Airway Management Planned: Natural Airway and Nasal Cannula  Additional Equipment:   Intra-op Plan:   Post-operative Plan:   Informed Consent: I  have reviewed the patients History and Physical, chart, labs and discussed the procedure including the risks, benefits and alternatives for the proposed anesthesia with the patient or authorized representative who has indicated his/her understanding and acceptance.     Dental Advisory Given  Plan Discussed with: Anesthesiologist, CRNA and Surgeon  Anesthesia Plan Comments:         Anesthesia Quick Evaluation

## 2022-06-07 NOTE — H&P (Signed)
[facility]   Primary Care Physician:  Belva Agee, NP Ophthalmologist: Dr. Merleen Nicely  Pre-Procedure History & Physical: HPI:  Robert Levine is a 67 y.o. male here for cataract surgery.   Past Medical History:  Diagnosis Date   CKD (chronic kidney disease), stage III ([place])    Depression    Diabetes mellitus without complication ([place])    Gout    Hypertension    Mild cognitive impairment    Wears dentures    full upper    Past Surgical History:  Procedure Laterality Date   COLONOSCOPY     TOE AMPUTATION Right     Prior to Admission medications   Medication Sig Start Date End Date Taking? Authorizing Provider  allopurinol (ZYLOPRIM) 100 MG tablet Take 100 mg by mouth daily. 05/03/21  Yes [provider]  amLODipine (NORVASC) 5 MG tablet Take 5 mg by mouth daily.   Yes [provider]  atorvastatin (LIPITOR) 20 MG tablet Take 20 mg by mouth daily.   Yes [provider]  Cholecalciferol (VITAMIN D3) 50 MCG (2000 UT) TABS Take 2,000 Units by mouth daily.   Yes [provider]  docusate sodium (COLACE) 100 MG capsule Take 100 mg by mouth 2 (two) times daily.   Yes [provider]  gabapentin (NEURONTIN) 100 MG capsule Take 100 mg by mouth 3 (three) times daily.   Yes [provider]  insulin glargine (LANTUS) 100 UNIT/ML injection Inject 30 Units into the skin at bedtime.   Yes [provider]  losartan (COZAAR) 25 MG tablet Take 25 mg by mouth daily. 05/03/21  Yes [provider]  metformin (FORTAMET) 500 MG (OSM) 24 hr tablet Take 500 mg by mouth daily with breakfast.   Yes [provider]  polyethylene glycol (MIRALAX / GLYCOLAX) 17 g packet Take 17 g by mouth 2 (two) times daily. 05/03/21  Yes [provider]  Semaglutide, 1 MG/DOSE, (OZEMPIC, 1 MG/DOSE,) 4 MG/3ML SOPN Inject 1 mg into the skin once a week.   Yes [provider]  Skin Protectants, Misc. (MINERIN CREME EX)  Apply topically daily.   Yes [provider]  Jay Schlichter Oil (ARTIFICIAL TEARS) ointment Place into both eyes as needed.   Yes [provider]  insulin NPH Human (NOVOLIN N) 100 UNIT/ML injection Inject 30 Units into the skin 2 (two) times daily.    [provider]  NOVOLOG FLEXPEN 100 UNIT/ML FlexPen Inject 3 Units into the skin 3 (three) times daily. 05/05/21   [provider]    Allergies as of 05/22/2022 - Review Complete 04/28/2022  Allergen Reaction Noted   Empagliflozin  01/17/2018    History reviewed. No pertinent family history.  Social History   Socioeconomic History   Marital status: Single    Spouse name: Not on file   Number of children: Not on file   Years of education: Not on file   Highest education level: Not on file  Occupational History   Not on file  Tobacco Use   Smoking status: Never   Smokeless tobacco: Never  Vaping Use   Vaping Use: Never used  Substance and Sexual Activity   Alcohol use: Not Currently   Drug use: Not on file   Sexual activity: Not on file  Other Topics Concern   Not on file  Social History Narrative   Not on file   Social Determinants of Health   Financial Resource Strain: Not on file  Food Insecurity: Not on file  Transportation Needs: Not on file  Physical Activity: Not on file  Stress: Not on file  Social Connections: Not on file  Intimate Partner Violence: Not on file    Review of Systems: See HPI, otherwise negative ROS  Physical Exam: Ht '6\' 4"'$  (1.93 m)   Wt 122 kg   BMI 32.74 kg/m  General:   Alert, cooperative in NAD Head:  Normocephalic and atraumatic. Respiratory:  Normal work of breathing. Cardiovascular:  RRR  Impression/Plan: Robert Levine is here for cataract surgery.  Risks, benefits, limitations, and alternatives regarding cataract surgery have been reviewed with the patient.  Questions have been answered.  All parties agreeable.   Norvel Richards, MD  06/07/2022, 11:40 AM

## 2022-06-07 NOTE — Transfer of Care (Signed)
Immediate Anesthesia Transfer of Care Note  Patient: Robert Levine  Procedure(s) Performed: CATARACT EXTRACTION PHACO AND INTRAOCULAR LENS PLACEMENT (IOC) LEFT VISION BLUE DIABETIC 16.81 01:24.6 (Left: Eye)  Patient Location: PACU  Anesthesia Type: MAC  Level of Consciousness: awake, alert  and patient cooperative  Airway and Oxygen Therapy: Patient Spontanous Breathing and Patient connected to supplemental oxygen  Post-op Assessment: Post-op Vital signs reviewed, Patient's Cardiovascular Status Stable, Respiratory Function Stable, Patent Airway and No signs of Nausea or vomiting  Post-op Vital Signs: Reviewed and stable  Complications: No notable events documented.

## 2022-06-07 NOTE — Op Note (Signed)
OPERATIVE NOTE  Robert Levine [id number] 06/07/2022   PREOPERATIVE DIAGNOSIS: Nuclear sclerotic cataract left eye. H25.12   POSTOPERATIVE DIAGNOSIS: Nuclear sclerotic cataract left eye. H25.12   PROCEDURE:  Phacoemusification with posterior chamber intraocular lens placement of the left eye  Ultrasound time: Procedure(s) with comments: CATARACT EXTRACTION PHACO AND INTRAOCULAR LENS PLACEMENT (IOC) LEFT VISION BLUE DIABETIC 16.81 01:24.6 (Left) - Diabetic  LENS:   Implant Name Type Inv. Item Serial No. Manufacturer Lot No. LRB No. Used Action  LENS IOL TECNIS EYHANCE 23.5 - [id number] Intraocular Lens LENS IOL TECNIS EYHANCE 23.5 [id number] [employer]  Left 1 Implanted      SURGEON:  Courtney Heys. Lazarus Salines, MD   ANESTHESIA:  Topical with tetracaine drops, augmented with 1% preservative-free intracameral lidocaine.   COMPLICATIONS:  None.   DESCRIPTION OF PROCEDURE:  The patient was identified in the holding room and transported to the operating room and placed in the supine position under the operating microscope.  The left eye was identified as the operative eye, which was prepped and draped in the usual sterile ophthalmic fashion.   A 1 millimeter clear-corneal paracentesis was made inferotemporally. Preservative-free 1% lidocaine mixed with 1:1,000 bisulfite-free aqueous solution of epinephrine was injected into the anterior chamber. The anterior chamber was then filled with Viscoat viscoelastic. A 2.4 millimeter keratome was used to make a clear-corneal incision superotemporally. A curvilinear capsulorrhexis was made with a cystotome and capsulorrhexis forceps. Balanced salt solution was used to hydrodissect and hydrodelineate the nucleus. Phacoemulsification was then used to remove the lens nucleus and epinucleus. The remaining cortex was then removed using the irrigation and aspiration handpiece. Provisc was then placed into the capsular bag to distend it for lens placement. A +23.50 D DIB00  intraocular lens was then injected into the capsular bag. The remaining viscoelastic was aspirated.   Wounds were hydrated with balanced salt solution.  The anterior chamber was inflated to a physiologic pressure with balanced salt solution.  No wound leaks were noted. Vigamox was injected intracamerally.  Timolol and Brimonidine drops were applied to the eye.  The patient was taken to the recovery room in stable condition without complications of anesthesia or surgery.  Maryann Alar [place] 06/07/2022, 12:58 PM

## 2022-06-07 NOTE — Anesthesia Postprocedure Evaluation (Signed)
Anesthesia Post Note  Patient: Robert Levine  Procedure(s) Performed: CATARACT EXTRACTION PHACO AND INTRAOCULAR LENS PLACEMENT (IOC) LEFT VISION BLUE DIABETIC 16.81 01:24.6 (Left: Eye)  Patient location during evaluation: PACU Anesthesia Type: MAC Level of consciousness: awake and alert Pain management: pain level controlled Vital Signs Assessment: post-procedure vital signs reviewed and stable Respiratory status: spontaneous breathing, nonlabored ventilation, respiratory function stable and patient connected to nasal cannula oxygen Cardiovascular status: stable and blood pressure returned to baseline Postop Assessment: no apparent nausea or vomiting Anesthetic complications: no   No notable events documented.   Last Vitals:  Vitals:   06/07/22 1259 06/07/22 1303  BP: 105/72 112/73  Pulse: 77 76  Resp: 14 14  Temp: (!) 36.3 C (!) 36.3 C  SpO2: 98% 97%    Last Pain:  Vitals:   06/07/22 1303  TempSrc:   PainSc: 0-No pain                 Martha Clan

## 2022-06-08 ENCOUNTER — Other Ambulatory Visit: Payer: Self-pay

## 2022-06-08 ENCOUNTER — Encounter: Payer: Self-pay | Admitting: Ophthalmology

## 2022-06-21 NOTE — Discharge Instructions (Signed)

## 2022-06-25 ENCOUNTER — Encounter: Payer: Self-pay | Admitting: Ophthalmology

## 2022-06-25 ENCOUNTER — Encounter
Admission: RE | Disposition: A | Discharge status: Home / Self Care | Payer: Self-pay | Source: Home / Self Care | Attending: Ophthalmology

## 2022-06-25 ENCOUNTER — Ambulatory Visit
Admission: RE | Admit: 2022-06-25 | Discharge: 2022-06-25 | Disposition: A | Discharge status: Home / Self Care | Payer: Medicare Other | Attending: Ophthalmology | Admitting: Ophthalmology

## 2022-06-25 ENCOUNTER — Ambulatory Visit: Payer: Medicare Other | Admitting: General Practice

## 2022-06-25 ENCOUNTER — Other Ambulatory Visit: Payer: Self-pay

## 2022-06-25 DIAGNOSIS — Z7985 Long-term (current) use of injectable non-insulin antidiabetic drugs: Secondary | ICD-10-CM | POA: Insufficient documentation

## 2022-06-25 DIAGNOSIS — E1136 Type 2 diabetes mellitus with diabetic cataract: Secondary | ICD-10-CM | POA: Diagnosis present

## 2022-06-25 DIAGNOSIS — I129 Hypertensive chronic kidney disease with stage 1 through stage 4 chronic kidney disease, or unspecified chronic kidney disease: Secondary | ICD-10-CM | POA: Insufficient documentation

## 2022-06-25 DIAGNOSIS — N183 Chronic kidney disease, stage 3 unspecified: Secondary | ICD-10-CM | POA: Insufficient documentation

## 2022-06-25 DIAGNOSIS — H2511 Age-related nuclear cataract, right eye: Secondary | ICD-10-CM | POA: Insufficient documentation

## 2022-06-25 DIAGNOSIS — Z794 Long term (current) use of insulin: Secondary | ICD-10-CM | POA: Insufficient documentation

## 2022-06-25 DIAGNOSIS — Z7984 Long term (current) use of oral hypoglycemic drugs: Secondary | ICD-10-CM | POA: Insufficient documentation

## 2022-06-25 DIAGNOSIS — E1122 Type 2 diabetes mellitus with diabetic chronic kidney disease: Secondary | ICD-10-CM | POA: Insufficient documentation

## 2022-06-25 DIAGNOSIS — E119 Type 2 diabetes mellitus without complications: Secondary | ICD-10-CM

## 2022-06-25 HISTORY — PX: CATARACT EXTRACTION W/PHACO: SHX586

## 2022-06-25 LAB — GLUCOSE, CAPILLARY: Glucose-Capillary: 133 mg/dL — ABNORMAL HIGH (ref 70–99)

## 2022-06-25 SURGERY — PHACOEMULSIFICATION, CATARACT, WITH IOL INSERTION
Anesthesia: Monitor Anesthesia Care | Site: Eye | Laterality: Right

## 2022-06-25 MED ORDER — SIGHTPATH DOSE#1 BSS IO SOLN
INTRAOCULAR | Status: DC | PRN
  Administered 2022-06-25: 73 mL via OPHTHALMIC

## 2022-06-25 MED ORDER — FENTANYL CITRATE (PF) 100 MCG/2ML IJ SOLN
INTRAMUSCULAR | Status: DC | PRN
  Administered 2022-06-25: 50 ug via INTRAVENOUS

## 2022-06-25 MED ORDER — SIGHTPATH DOSE#1 BSS IO SOLN
INTRAOCULAR | Status: DC | PRN
  Administered 2022-06-25: 15 mL

## 2022-06-25 MED ORDER — BRIMONIDINE TARTRATE-TIMOLOL 0.2-0.5 % OP SOLN
OPHTHALMIC | Status: DC | PRN
  Administered 2022-06-25: 1 [drp]

## 2022-06-25 MED ORDER — MOXIFLOXACIN HCL 0.5 % OP SOLN
OPHTHALMIC | Status: DC | PRN
  Administered 2022-06-25: .2 mL via OPHTHALMIC

## 2022-06-25 MED ORDER — TETRACAINE HCL 0.5 % OP SOLN
1.0000 [drp] | OPHTHALMIC | Status: DC | PRN
  Administered 2022-06-25 (×3): 1 [drp] via OPHTHALMIC

## 2022-06-25 MED ORDER — ARMC OPHTHALMIC DILATING DROPS
1.0000 | OPHTHALMIC | Status: DC | PRN
  Administered 2022-06-25 (×3): 1 via OPHTHALMIC

## 2022-06-25 MED ORDER — MIDAZOLAM HCL 2 MG/2ML IJ SOLN
INTRAMUSCULAR | Status: DC | PRN
  Administered 2022-06-25: 2 mg via INTRAVENOUS

## 2022-06-25 MED ORDER — SIGHTPATH DOSE#1 NA HYALUR & NA CHOND-NA HYALUR IO KIT
PACK | INTRAOCULAR | Status: DC | PRN
  Administered 2022-06-25: 1 via OPHTHALMIC

## 2022-06-25 MED ORDER — LIDOCAINE HCL (PF) 2 % IJ SOLN
INTRAOCULAR | Status: DC | PRN
  Administered 2022-06-25: 1 mL via INTRAOCULAR

## 2022-06-25 SURGICAL SUPPLY — 22 items
CANNULA ANT/CHMB 27G (MISCELLANEOUS) IMPLANT
CANNULA ANT/CHMB 27GA (MISCELLANEOUS) IMPLANT
CATARACT SUITE SIGHTPATH (MISCELLANEOUS) ×1 IMPLANT
DISSECTOR HYDRO NUCLEUS 50X22 (MISCELLANEOUS) ×1 IMPLANT
DRSG TEGADERM 2-3/8X2-3/4 SM (GAUZE/BANDAGES/DRESSINGS) ×1 IMPLANT
FEE CATARACT SUITE SIGHTPATH (MISCELLANEOUS) ×1 IMPLANT
GLOVE SURG SYN 7.5  E (GLOVE) ×1
GLOVE SURG SYN 7.5 E (GLOVE) ×1 IMPLANT
GLOVE SURG SYN 7.5 PF PI (GLOVE) ×1 IMPLANT
GLOVE SURG SYN 8.5  E (GLOVE) ×1
GLOVE SURG SYN 8.5 E (GLOVE) ×1 IMPLANT
GLOVE SURG SYN 8.5 PF PI (GLOVE) ×1 IMPLANT
LENS IOL TECNIS EYHANCE 21.0 (Intraocular Lens) IMPLANT
NDL FILTER BLUNT 18X1 1/2 (NEEDLE) IMPLANT
NEEDLE FILTER BLUNT 18X1 1/2 (NEEDLE) IMPLANT
PACK VIT ANT 23G (MISCELLANEOUS) IMPLANT
RING MALYGIN 7.0 (MISCELLANEOUS) IMPLANT
SUT ETHILON 10-0 CS-B-6CS-B-6 (SUTURE)
SUTURE EHLN 10-0 CS-B-6CS-B-6 (SUTURE) IMPLANT
SYR 3ML LL SCALE MARK (SYRINGE) IMPLANT
SYR 5ML LL (SYRINGE) IMPLANT
WATER STERILE IRR 250ML POUR (IV SOLUTION) ×1 IMPLANT

## 2022-06-25 NOTE — Anesthesia Preprocedure Evaluation (Addendum)
Anesthesia Evaluation  Patient identified by MRN, date of birth, ID band Patient awake    Reviewed: Allergy & Precautions, H&P , NPO status , Patient's Chart, lab work & pertinent test results, reviewed documented beta blocker date and time   History of Anesthesia Complications Negative for: history of anesthetic complications  Airway Mallampati: II  TM Distance: >3 FB Neck ROM: full    Dental no notable dental hx. (+) Caps, Dental Advidsory Given, Poor Dentition, Missing, Chipped   Pulmonary neg pulmonary ROS   Pulmonary exam normal breath sounds clear to auscultation       Cardiovascular Exercise Tolerance: Good hypertension, (-) angina (-) Past MI and (-) Cardiac Stents Normal cardiovascular exam(-) dysrhythmias (-) Valvular Problems/Murmurs Rhythm:regular Rate:Normal     Neuro/Psych  PSYCHIATRIC DISORDERS  Depression    negative neurological ROS     GI/Hepatic negative GI ROS, Neg liver ROS,,,  Endo/Other  diabetes    Renal/GU CRFRenal disease  negative genitourinary   Musculoskeletal   Abdominal   Peds  Hematology negative hematology ROS (+)   Anesthesia Other Findings Past Medical History: No date: CKD (chronic kidney disease), stage III ([facility]) No date: Depression No date: Diabetes mellitus without complication ([facility]) No date: Gout No date: Hypertension No date: Mild cognitive impairment No date: Wears dentures     Comment:  full upper   Reproductive/Obstetrics negative OB ROS                             Anesthesia Physical Anesthesia Plan  ASA: 3  Anesthesia Plan: MAC   Post-op Pain Management:    Induction: Intravenous  PONV Risk Score and Plan: 1 and Midazolam, Treatment may vary due to age or medical condition and TIVA  Airway Management Planned: Natural Airway and Nasal Cannula  Additional Equipment:   Intra-op Plan:   Post-operative Plan:   Informed  Consent: I have reviewed the patients History and Physical, chart, labs and discussed the procedure including the risks, benefits and alternatives for the proposed anesthesia with the patient or authorized representative who has indicated his/her understanding and acceptance.     Dental Advisory Given  Plan Discussed with: Anesthesiologist, CRNA and Surgeon  Anesthesia Plan Comments: (Patient consented for risks of anesthesia including but not limited to:  - adverse reactions to medications - damage to eyes, teeth, lips or other oral mucosa - nerve damage due to positioning  - sore throat or hoarseness - Damage to heart, brain, nerves, lungs, other parts of body or loss of life  Patient voiced understanding.)        Anesthesia Quick Evaluation

## 2022-06-25 NOTE — Op Note (Signed)
OPERATIVE NOTE  Robert Levine [id number] 06/25/2022   PREOPERATIVE DIAGNOSIS: Nuclear sclerotic cataract right eye. H25.11   POSTOPERATIVE DIAGNOSIS: Nuclear sclerotic cataract right eye. H25.11   PROCEDURE:  Phacoemusification with posterior chamber intraocular lens placement of the right eye  Ultrasound time: Procedure(s) with comments: CATARACT EXTRACTION PHACO AND INTRAOCULAR LENS PLACEMENT (IOC) RIGHT VISION BLUE DIABETIC (Right) - 9.49 0:51.0  LENS:   Implant Name Type Inv. Item Serial No. Manufacturer Lot No. LRB No. Used Action  LENS IOL TECNIS EYHANCE 21.0 - [id number] Intraocular Lens LENS IOL TECNIS EYHANCE 21.0 [id number] [employer]  Right 1 Implanted      SURGEON:  Courtney Heys. Lazarus Salines, MD   ANESTHESIA:  Topical with tetracaine drops, augmented with 1% preservative-free intracameral lidocaine.   COMPLICATIONS:  None.   DESCRIPTION OF PROCEDURE:  The patient was identified in the holding room and transported to the operating room and placed in the supine position under the operating microscope.  The right eye was identified as the operative eye, which was prepped and draped in the usual sterile ophthalmic fashion.   A 1 millimeter clear-corneal paracentesis was made superotemporally. Preservative-free 1% lidocaine mixed with 1:1,000 bisulfite-free aqueous solution of epinephrine was injected into the anterior chamber. The anterior chamber was then filled with Viscoat viscoelastic. A 2.4 millimeter keratome was used to make a clear-corneal incision inferotemporally. A curvilinear capsulorrhexis was made with a cystotome and capsulorrhexis forceps. Balanced salt solution was used to hydrodissect and hydrodelineate the nucleus. Phacoemulsification was then used to remove the lens nucleus and epinucleus. The remaining cortex was then removed using the irrigation and aspiration handpiece. Provisc was then placed into the capsular bag to distend it for lens placement. A +21.00 D DIB00  intraocular lens was then injected into the capsular bag. The remaining viscoelastic was aspirated.   Wounds were hydrated with balanced salt solution.  The anterior chamber was inflated to a physiologic pressure with balanced salt solution.  No wound leaks were noted. Vigamox was injected intracamerally.  Timolol and Brimonidine drops were applied to the eye.  The patient was taken to the recovery room in stable condition without complications of anesthesia or surgery.  [name] 06/25/2022, 8:08 AM

## 2022-06-25 NOTE — Anesthesia Postprocedure Evaluation (Signed)
Anesthesia Post Note  Patient: Robert Levine  Procedure(s) Performed: CATARACT EXTRACTION PHACO AND INTRAOCULAR LENS PLACEMENT (IOC) RIGHT VISION BLUE DIABETIC (Right: Eye)  Patient location during evaluation: PACU Anesthesia Type: MAC Level of consciousness: awake and alert Pain management: pain level controlled Vital Signs Assessment: post-procedure vital signs reviewed and stable Respiratory status: spontaneous breathing, nonlabored ventilation, respiratory function stable and patient connected to nasal cannula oxygen Cardiovascular status: stable and blood pressure returned to baseline Postop Assessment: no apparent nausea or vomiting Anesthetic complications: no  No notable events documented.   Last Vitals:  Vitals:   06/25/22 0809 06/25/22 0814  BP: 111/72 120/76  Pulse: 77 71  Resp: (!) 9 15  Temp: (!) 36.3 C (!) 36.3 C  SpO2: 99% 100%    Last Pain:  Vitals:   06/25/22 0814  TempSrc:   PainSc: 0-No pain                 Dimas Millin

## 2022-06-25 NOTE — Transfer of Care (Signed)
Immediate Anesthesia Transfer of Care Note  Patient: Robert Levine  Procedure(s) Performed: CATARACT EXTRACTION PHACO AND INTRAOCULAR LENS PLACEMENT (IOC) RIGHT VISION BLUE DIABETIC (Right: Eye)  Patient Location: PACU  Anesthesia Type: MAC  Level of Consciousness: awake, alert  and patient cooperative  Airway and Oxygen Therapy: Patient Spontanous Breathing and Patient connected to supplemental oxygen  Post-op Assessment: Post-op Vital signs reviewed, Patient's Cardiovascular Status Stable, Respiratory Function Stable, Patent Airway and No signs of Nausea or vomiting  Post-op Vital Signs: Reviewed and stable  Complications: No notable events documented.

## 2022-06-25 NOTE — H&P (Signed)
[facility]   Primary Care Physician:  Belva Agee, NP Ophthalmologist: Dr. Merleen Nicely  Pre-Procedure History & Physical: HPI:  Robert Levine is a 67 y.o. male here for cataract surgery.   Past Medical History:  Diagnosis Date   CKD (chronic kidney disease), stage III ([place])    Depression    Diabetes mellitus without complication ([place])    Gout    Hypertension    Mild cognitive impairment    Wears dentures    full upper    Past Surgical History:  Procedure Laterality Date   CATARACT EXTRACTION W/PHACO Left 06/07/2022   Procedure: CATARACT EXTRACTION PHACO AND INTRAOCULAR LENS PLACEMENT ([place]) LEFT VISION BLUE DIABETIC 16.81 01:24.6;  Surgeon: Norvel Richards, MD;  Location: [facility];  Service: Ophthalmology;  Laterality: Left;  Diabetic   COLONOSCOPY     TOE AMPUTATION Right     Prior to Admission medications   Medication Sig Start Date End Date Taking? Authorizing Provider  amLODipine (NORVASC) 5 MG tablet Take 5 mg by mouth daily.   Yes [provider]  atorvastatin (LIPITOR) 20 MG tablet Take 20 mg by mouth daily.   Yes [provider]  Cholecalciferol (VITAMIN D3) 50 MCG (2000 UT) TABS Take 2,000 Units by mouth daily.   Yes [provider]  docusate sodium (COLACE) 100 MG capsule Take 100 mg by mouth 2 (two) times daily.   Yes [provider]  gabapentin (NEURONTIN) 100 MG capsule Take 100 mg by mouth 3 (three) times daily.   Yes [provider]  insulin glargine (LANTUS) 100 UNIT/ML injection Inject 30 Units into the skin at bedtime.   Yes [provider]  insulin NPH Human (NOVOLIN N) 100 UNIT/ML injection Inject 30 Units into the skin 2 (two) times daily.   Yes [provider]  losartan (COZAAR) 25 MG tablet Take 25 mg by mouth daily. 05/03/21  Yes [provider]  metformin (FORTAMET) 500 MG (OSM) 24 hr tablet Take 500 mg by mouth daily with breakfast.   Yes [provider]  NOVOLOG FLEXPEN 100 UNIT/ML FlexPen Inject 3 Units into the skin 3 (three) times daily. 05/05/21  Yes [provider]  polyethylene glycol (MIRALAX / GLYCOLAX) 17 g packet Take 17 g by mouth 2 (two) times daily. 05/03/21  Yes [provider]  Semaglutide, 1 MG/DOSE, (OZEMPIC, 1 MG/DOSE,) 4 MG/3ML SOPN Inject 1 mg into the skin once a week.   Yes [provider]  Skin Protectants, Misc. (MINERIN CREME EX) Apply topically daily.   Yes [provider]  Jay Schlichter Oil (ARTIFICIAL TEARS) ointment Place into both eyes as needed.   Yes [provider]  allopurinol (ZYLOPRIM) 100 MG tablet Take 100 mg by mouth daily. 05/03/21   [provider]    Allergies as of 05/22/2022 - Review Complete 04/28/2022  Allergen Reaction Noted   Empagliflozin  01/17/2018    History reviewed. No pertinent family history.  Social History   Socioeconomic History   Marital status: Single    Spouse name: Not on file   Number of children: Not on file   Years of education: Not on file   Highest education level: Not on file  Occupational History   Not on file  Tobacco Use   Smoking status: Never   Smokeless tobacco: Never  Vaping Use   Vaping Use: Never used  Substance and Sexual Activity   Alcohol use: Not Currently   Drug use:  Not on file   Sexual activity: Not on file  Other Topics Concern   Not on file  Social History Narrative   Not on file   Social Determinants of Health   Financial Resource Strain: Not on file  Food Insecurity: Not on file  Transportation Needs: Not on file  Physical Activity: Not on file  Stress: Not on file  Social Connections: Not on file  Intimate Partner Violence: Not on file    Review of Systems: See HPI, otherwise negative ROS  Physical Exam: BP (!) 153/75   Pulse 77   Temp (!) 97 F (36.1 C) (Temporal)   Resp 17   Ht 6\' 4"  (1.93 m)   Wt 116.8 kg   SpO2 97%   BMI 31.33 kg/m   General:   Alert, cooperative in NAD Head:  Normocephalic and atraumatic. Respiratory:  Normal work of breathing. Cardiovascular:  RRR  Impression/Plan: Robert Levine is here for cataract surgery.  Risks, benefits, limitations, and alternatives regarding cataract surgery have been reviewed with the patient.  Questions have been answered.  All parties agreeable.   Norvel Richards, MD  06/25/2022, 7:07 AM

## 2022-06-26 ENCOUNTER — Encounter: Payer: Self-pay | Admitting: Ophthalmology

## 2022-06-27 ENCOUNTER — Encounter: Payer: Medicare Other | Attending: Internal Medicine | Admitting: Internal Medicine

## 2022-06-27 DIAGNOSIS — E1142 Type 2 diabetes mellitus with diabetic polyneuropathy: Secondary | ICD-10-CM | POA: Diagnosis not present

## 2022-06-27 DIAGNOSIS — E11621 Type 2 diabetes mellitus with foot ulcer: Secondary | ICD-10-CM | POA: Insufficient documentation

## 2022-06-27 DIAGNOSIS — N183 Chronic kidney disease, stage 3 unspecified: Secondary | ICD-10-CM | POA: Insufficient documentation

## 2022-06-27 DIAGNOSIS — Z794 Long term (current) use of insulin: Secondary | ICD-10-CM | POA: Diagnosis not present

## 2022-06-27 DIAGNOSIS — I129 Hypertensive chronic kidney disease with stage 1 through stage 4 chronic kidney disease, or unspecified chronic kidney disease: Secondary | ICD-10-CM | POA: Diagnosis not present

## 2022-06-27 DIAGNOSIS — L97512 Non-pressure chronic ulcer of other part of right foot with fat layer exposed: Secondary | ICD-10-CM | POA: Insufficient documentation

## 2022-06-27 DIAGNOSIS — Z89411 Acquired absence of right great toe: Secondary | ICD-10-CM | POA: Diagnosis not present

## 2022-06-27 DIAGNOSIS — E1122 Type 2 diabetes mellitus with diabetic chronic kidney disease: Secondary | ICD-10-CM | POA: Diagnosis not present

## 2022-06-27 NOTE — Progress Notes (Signed)
DEMONI, HAENEL ([id number]) [id number] Page 1 of 4 Visit Report for 06/27/2022 Abuse Risk Screen Details Patient Name: Date of Service: Robert Levine [facility] 06/27/2022 10:30 A M Medical Record Number: [id number] Patient Account Number: [id number] Date of Birth/Sex: Treating RN: 1956/01/14 (67 y.o. [facility] Primary [facility] Squire Withey: Annamary Rummage Other Clinician: Referring Vernon Maish: Treating Said Rueb/Extender: Roma Schanz Weeks in Treatment: 0 Abuse Risk Screen Items Answer Electronic Signature(s) Signed: 06/27/2022 3:57:02 PM By: Rosalio Loud MSN RN CNS WTA Entered By: Rosalio Loud on 06/27/2022 15:57:02 -------------------------------------------------------------------------------- Activities of Daily Living Details Patient Name: Date of Service: Robert Levine [facility] 06/27/2022 10:30 A M Medical Record Number: [id number] Patient Account Number: [id number] Date of Birth/Sex: Treating RN: 25-Sep-1955 (67 y.o. [facility] Primary Care Shealeigh Dunstan: Annamary Rummage Other Clinician: Referring Sharvil Hoey: Treating Aryaan Persichetti/Extender: Roma Schanz Weeks in Treatment: 0 Activities of Daily Living Items Answer Activities of Daily Living (Please select one for each item) Drive Automobile Not Able T Medications ake Completely Able Use T elephone Completely Able Care for Appearance Completely Able Use T oilet Completely Able Bath / Shower Completely Able Dress Self Completely Able Feed Self Completely Able Walk Completely Able Get In / Out Bed Completely Able Housework Completely Able Prepare Meals Need Assistance Handle Money Completely Able Shop for Self Need Assistance Electronic Signature(s) Signed: 06/27/2022 3:57:09 PM By: Rosalio Loud MSN RN CNS WTA Entered By: Rosalio Loud on 06/27/2022 15:57:08 -------------------------------------------------------------------------------- Education Screening  Details Patient Name: Date of Service: Robert Levine, Robert Levine 06/27/2022 10:30 A M Medical Record Number: [id number] Patient Account Number: [id number] Date of Birth/Sex: Treating RN: 25-Sep-1955 (67 y.o. [facility] Primary Care Casi Westerfeld: Annamary Rummage Other Clinician: Referring Adonay Scheier: Treating Smrithi Pigford/Extender: Roma Schanz Weeks in Treatment: 0 Learning Preferences/Education Level/Primary Language Learning Preference: Explanation, Demonstration Vicenta Dunning ([id number]) [id number] Page 2 of 4 Highest Education Level: High School Preferred Language: Diplomatic Services operational officer Language Barrier: No Translator Needed: No Memory Deficit: No Emotional Barrier: No Cultural/Religious Beliefs Affecting Medical Care: No Physical Barrier Impaired Vision: Yes Glasses Impaired Hearing: No Decreased Hand dexterity: No Knowledge/Comprehension Knowledge Level: High Comprehension Level: High Ability to understand written instructions: High Ability to understand verbal instructions: High Motivation Anxiety Level: Calm Cooperation: Cooperative Education Importance: Acknowledges Need Interest in Health Problems: Asks Questions Perception: Coherent Willingness to Engage in Self-Management High Activities: Readiness to Engage in Self-Management High Activities: Electronic Signature(s) Signed: 06/27/2022 3:57:13 PM By: Rosalio Loud MSN RN CNS WTA Entered By: Rosalio Loud on 06/27/2022 15:57:13 -------------------------------------------------------------------------------- Fall Risk Assessment Details Patient Name: Date of Service: Robert Levine, Robert Levine 06/27/2022 10:30 [place] Number: [id number] Patient Account Number: [id number] Date of Birth/Sex: Treating RN: Aug 18, 1955 (67 y.o. [facility] Primary Care Lacrisha Bielicki: Annamary Rummage Other Clinician: Referring Mailyn Steichen: Treating Ashelynn Marks/Extender: Roma Schanz Weeks in Treatment: 0 Fall Risk Assessment Items Have you had 2 or more falls in the last 12 monthso 0 No Have you had any fall that resulted in injury in the last 12 monthso 0 No FALLS RISK SCREEN History of falling - immediate or within 3 months 0 No Secondary diagnosis (Do you have 2 or more medical diagnoseso) 0 No Ambulatory aid None/bed rest/wheelchair/nurse 0 No Crutches/cane/walker 0 No Furniture 0 No Intravenous therapy Access/Saline/Heparin Lock 0 No Gait/Transferring Normal/ bed rest/ wheelchair 0 No  Weak (short steps with or without shuffle, stooped but able to lift head while walking, may seek 0 No support from furniture) Impaired (short steps with shuffle, may have difficulty arising from chair, head down, impaired 0 No balance) Mental Status Oriented to own ability 0 Yes Electronic Signature(s) Signed: 06/27/2022 3:57:22 PM By: Rosalio Loud MSN RN CNS WTA Entered By: Rosalio Loud on 06/27/2022 15:57:22 Vicenta Dunning ([id number]) [phone] [id number] Page 3 of 4 -------------------------------------------------------------------------------- Foot Assessment Details Patient Name: Date of Service: Robert Levine 06/27/2022 10:30 A M Medical Record Number: [id number] Patient Account Number: [id number] Date of Birth/Sex: Treating RN: 1955-11-09 (67 y.o. [facility] Primary Care Michaelia Beilfuss: Annamary Rummage Other Clinician: Referring Charli Liberatore: Treating Kaleigh Spiegelman/Extender: Roma Schanz Weeks in Treatment: 0 Foot Assessment Items Site Locations + = Sensation present, - = Sensation absent, C = Callus, U = Ulcer R = Redness, W = Warmth, M = Maceration, PU = Pre-ulcerative lesion F = Fissure, S = Swelling, D = Dryness Assessment Right: Left: Other Deformity: No No Prior Foot Ulcer: Yes No Prior Amputation: Yes No Charcot Joint: No No Ambulatory Status: Ambulatory Without Help Gait: Steady Electronic  Signature(s) Signed: 06/27/2022 3:57:35 PM By: Rosalio Loud MSN RN CNS WTA Entered By: Rosalio Loud on 06/27/2022 15:57:35 -------------------------------------------------------------------------------- Nutrition Risk Screening Details Patient Name: Date of Service: Robert Levine [facility] 06/27/2022 10:30 A M Medical Record Number: [id number] Patient Account Number: [id number] Date of Birth/Sex: Treating RN: 02-Oct-1955 (67 y.o. [facility] Primary Care Wonder Donaway: Annamary Rummage Other Clinician: Referring Eadie Repetto: Treating Krystle Polcyn/Extender: Roma Schanz Weeks in Treatment: 0 Height (in): 77 Weight (lbs): 257 Body Mass Index (BMI): 30.5 Nutrition Risk Screening Items Score Screening ZADE, NORDHOFF ([id number]) [phone] [id number] Page 4 of 4 NUTRITION RISK SCREEN: I have an illness or condition that made me change the kind and/or amount of food I eat 0 No I eat fewer than two meals per day 0 No I eat few fruits and vegetables, or milk products 0 No I have three or more drinks of beer, liquor or wine almost every day 0 No I have tooth or mouth problems that make it hard for me to eat 0 No I don't always have enough money to buy the food I need 0 No I eat alone most of the time 0 No I take three or more different prescribed or over-the-counter drugs a day 0 No Without wanting to, I have lost or gained 10 pounds in the last six months 0 No I am not always physically able to shop, cook and/or feed myself 0 No Nutrition Protocols Good Risk Protocol 0 No interventions needed Moderate Risk Protocol High Risk Proctocol Risk Level: Good Risk Score: 0 Electronic Signature(s) Signed: 06/27/2022 3:57:27 PM By: Rosalio Loud MSN RN CNS WTA Entered By: Rosalio Loud on 06/27/2022 15:57:26

## 2022-06-27 NOTE — Progress Notes (Addendum)
Robert Levine ([id number]) [id number] Page 1 of 9 Visit Report for 06/27/2022 Allergy List Details Patient Name: Date of Service: Robert Levine [facility] [place] 06/27/2022 10:30 A M Medical Record Number: [id number] Patient Account Number: [id number] Date of Birth/Sex: Treating RN: Aug 04, 1955 (67 y.o. Seward Meth Primary Care Braulio Kiedrowski: Annamary Rummage Other Clinician: Referring Chizara Mena: Treating Lowana Hable/Extender: Rock Nephew, JO A N Weeks in Treatment: 0 Allergies Active Allergies empagliflozin Allergy Notes Electronic Signature(s) Signed: 06/27/2022 3:55:27 PM By: Rosalio Loud MSN RN CNS WTA Entered By: Rosalio Loud on 06/27/2022 15:55:27 -------------------------------------------------------------------------------- Arrival Information Details Patient Name: Date of Service: Robert Levine, [name] 06/27/2022 10:30 A M Medical Record Number: [id number] Patient Account Number: [id number] Date of Birth/Sex: Treating RN: 1956/01/12 (67 y.o. Seward Meth Primary Care Karyssa Amaral: Annamary Rummage Other Clinician: Referring Elissia Spiewak: Treating Laurielle Selmon/Extender: Roma Schanz Weeks in Treatment: 0 Visit Information Patient Arrived: Ambulatory Arrival Time: 10:16 Accompanied By: self Transfer Assistance: None Patient Identification Verified: Yes Secondary Verification Process Completed: Yes Patient Requires Transmission-Based Precautions: No Patient Has Alerts: Yes Patient Alerts: DM II History Since Last Visit Added or deleted any medications: No Has Dressing in Place as Prescribed: Yes Pain Present Now: No Electronic Signature(s) Signed: 07/02/2022 9:04:16 AM By: Rosalio Loud MSN RN CNS WTA Previous Signature: 06/27/2022 3:54:53 PM Version By: Rosalio Loud MSN RN CNS WTA Entered By: Rosalio Loud on 07/02/2022 09:04:16 -------------------------------------------------------------------------------- Clinic Level of Care Assessment  Details Patient Name: Date of Service: Robert Levine [facility] 06/27/2022 10:30 A M Medical Record Number: [id number] Patient Account Number: [id number] Date of Birth/Sex: Treating RN: 1955/12/07 (67 y.o. Seward Meth Primary Care Emric Kowalewski: Annamary Rummage Other Clinician: Referring Somalia Segler: Treating Bodi Palmeri/Extender: Roma Schanz Weeks in Treatment: 0 Clinic Level of Care Assessment Items TOOL 1 Quantity Score X- 1 0 Use when EandM and Procedure is performed on INITIAL visit ASSESSMENTS - Nursing Assessment / Reassessment X- 1 20 General Physical Exam (combine w/ comprehensive assessment (listed just below) when performed on new pt. [address]SH[address], Levine ([id number]) [id number] Page 2 of 9 X- 1 25 Comprehensive Assessment (HX, ROS, Risk Assessments, Wounds Hx, etc.) ASSESSMENTS - Wound and Skin Assessment / Reassessment []  - 0 Dermatologic / Skin Assessment (not related to wound area) ASSESSMENTS - Ostomy and/or Continence Assessment and Care []  - 0 Incontinence Assessment and Management []  - 0 Ostomy Care Assessment and Management (repouching, etc.) PROCESS - Coordination of Care X - Simple Patient / Family Education for ongoing care 1 15 []  - 0 Complex (extensive) Patient / Family Education for ongoing care X- 1 10 Staff obtains Programmer, systems, Records, T Results / Process Orders est []  - 0 Staff telephones HHA, Nursing Homes / Clarify orders / etc []  - 0 Routine Transfer to another Facility (non-emergent condition) []  - 0 Routine Hospital Admission (non-emergent condition) X- 1 15 New Admissions / Biomedical engineer / Ordering NPWT Apligraf, etc. , []  - 0 Emergency Hospital Admission (emergent condition) PROCESS - Special Needs []  - 0 Pediatric / Minor Patient Management []  - 0 Isolation Patient Management []  - 0 Hearing / Language / Visual special needs []  - 0 Assessment of Community assistance (transportation,  D/C planning, etc.) []  - 0 Additional assistance / Altered mentation []  - 0 Support Surface(s) Assessment (bed, cushion, seat, etc.) INTERVENTIONS - Miscellaneous []  - 0 External ear exam []  - 0 Patient Transfer (multiple staff / Civil Service fast streamer /  Similar devices) []  - 0 Simple Staple / Suture removal (25 or less) []  - 0 Complex Staple / Suture removal (26 or more) []  - 0 Hypo/Hyperglycemic Management (do not check if billed separately) X- 1 15 Ankle / Brachial Index (ABI) - do not check if billed separately Has the patient been seen at the hospital within the last three years: Yes Total Score: 100 Level Of Care: New/Established - Level 3 Electronic Signature(s) Signed: 06/27/2022 4:12:46 PM By: Rosalio Loud MSN RN CNS WTA Entered By: Rosalio Loud on 06/27/2022 15:59:09 -------------------------------------------------------------------------------- Encounter Discharge Information Details Patient Name: Date of Service: Robert Levine, [name] [facility] 06/27/2022 10:30 [place] Record Number: [id number] Patient Account Number: [id number] Date of Birth/Sex: Treating RN: 1955/06/25 (67 y.o. Seward Meth Primary Care Robert Levine: Annamary Rummage Other Clinician: Referring Robert Levine: Treating Robert Levine/Extender: Roma Schanz Weeks in Treatment: 0 Encounter Discharge Information Items Post Procedure Vitals Discharge Condition: Stable Temperature (F): 97.7 Ambulatory Status: Ambulatory Pulse (bpm): 69 Discharge Destination: Home Respiratory Rate (breaths/min): 16 Transportation: Private Auto Blood Pressure (mmHg): 145/86 Accompanied By: self Schedule Follow-up Appointment: Arturo Morton ([id number]) [id number] Page 3 of 9 Clinical Summary of Care: Electronic Signature(s) Signed: 06/27/2022 4:01:36 PM By: Rosalio Loud MSN RN CNS WTA Entered By: Rosalio Loud on 06/27/2022  16:01:36 -------------------------------------------------------------------------------- Lower Extremity Assessment Details Patient Name: Date of Service: Robert Levine [facility] 06/27/2022 10:30 A M Medical Record Number: [id number] Patient Account Number: [id number] Date of Birth/Sex: Treating RN: Aug 07, 1955 (67 y.o. Seward Meth Primary Care Cordarrel Stiefel: Annamary Rummage Other Clinician: Referring Kailo Kosik: Treating Dewaine Morocho/Extender: Roma Schanz Weeks in Treatment: 0 Edema Assessment Assessed: [Left: Yes] [Right: Yes] Edema: [Left: No] [Right: Yes] Calf Left: Right: Point of Measurement: 38 cm From Medial Instep 39.5 cm 40.5 cm Ankle Left: Right: Point of Measurement: 12 cm From Medial Instep 26.7 cm 28.5 cm Knee To Floor Left: Right: From Medial Instep 47 cm 47 cm Vascular Assessment Pulses: Dorsalis Pedis Palpable: [Left:No] [Right:No] Doppler Audible: [Left:Yes] [Right:Yes] Posterior Tibial Palpable: [Left:No] [Right:No] Doppler Audible: [Left:Yes] [Right:Yes] Blood Pressure: Brachial: [Left:129] [Right:129] Ankle: [Left:Posterior Tibial: 159 1.23] [Right:Posterior Tibial: 146 1.13] Electronic Signature(s) Signed: 06/27/2022 3:55:11 PM By: Rosalio Loud MSN RN CNS WTA Entered By: Rosalio Loud on 06/27/2022 15:55:10 -------------------------------------------------------------------------------- Multi Wound Chart Details Patient Name: Date of Service: Robert Levine, [name] 06/27/2022 10:30 A M Medical Record Number: [id number] Patient Account Number: [id number] Date of Birth/Sex: Treating RN: [birth year] (67 y.o. [name] Primary Care Ryoma Nofziger: Annamary Rummage Other Clinician: Referring Mayrin Schmuck: Treating Dariyah Garduno/Extender: Roma Schanz Weeks in Treatment: 0 Vital Signs Height(in): 77 Pulse(bpm): 69 Weight(lbs): 257 Blood Pressure(mmHg): 145/86 Body Mass Index(BMI): 30.5 Temperature(F): 97.7 Respiratory  Rate(breaths/min): [address] [place] ([id number]) [phone].pdf Page 4 of 9 [2:Photos:] [N/A:N/A] Right Amputation Site - Toe N/A N/A Wound Location: Surgical Injury N/A N/A Wounding Event: Diabetic Wound/Ulcer of the Lower N/A N/A Primary Etiology: Extremity Hypertension, Type II Diabetes N/A N/A Comorbid History: 06/03/2022 N/A N/A Date A cquired: 0 N/A N/A Weeks of Treatment: Open N/A N/A Wound Status: No N/A N/A Wound Recurrence: 2.7x1.5x0.5 N/A N/A Measurements L x W x D (cm) 3.181 N/A N/A A (cm) : rea 1.59 N/A N/A Volume (cm) : Grade 2 N/A N/A Classification: Medium N/A N/A Exudate A mount: Serosanguineous N/A N/A Exudate Type: red, brown N/A N/A Exudate Color: Medium (34-66%) N/A N/A Granulation A mount:  Red, Pink N/A N/A Granulation Quality: Small (1-33%) N/A N/A Necrotic A mount: Eschar N/A N/A Necrotic Tissue: Fascia: No N/A N/A Exposed Structures: Fat Layer (Subcutaneous Tissue): No Tendon: No Muscle: No Joint: No Bone: No None N/A N/A Epithelialization: Debridement - Excisional N/A N/A Debridement: Callus, Subcutaneous, Slough N/A N/A Tissue Debrided: Skin/Subcutaneous Tissue N/A N/A Level: 0.5 N/A N/A Debridement A (sq cm): rea Curette N/A N/A Instrument: Moderate N/A N/A Bleeding: Pressure N/A N/A Hemostasis A chieved: Debridement Treatment Response: Procedure was tolerated well N/A N/A Post Debridement Measurements L x 2.7x1.5x0.5 N/A N/A W x D (cm) 1.59 N/A N/A Post Debridement Volume: (cm) Debridement N/A N/A Procedures Performed: Treatment Notes Wound #2 (Amputation Site - Toe) Wound Laterality: Right Cleanser Soap and Water Discharge Instruction: Gently cleanse wound with antibacterial soap, rinse and pat dry prior to dressing wounds Peri-Wound Care Topical Primary Dressing Hydrofera Blue Ready Transfer Foam, 2.5x2.5 (in/in) Discharge Instruction: Apply Hydrofera Blue Ready to wound bed as  directed Secondary Dressing ABD Pad 5x9 (in/in) Discharge Instruction: Cover with ABD pad Secured With Medipore T - 66M Medipore H Soft Cloth Surgical T ape ape, 2x2 (in/yd) Kerlix Roll Sterile or Non-Sterile 6-ply 4.5x4 (yd/yd) Discharge Instruction: Apply Kerlix as directed Compression Wrap Compression Stockings SHAFFER, CARRY ([id number]) [id number] Page 5 of 9 Add-Ons Electronic Signature(s) Signed: 06/27/2022 3:57:48 PM By: Rosalio Loud MSN RN CNS WTA Entered By: Rosalio Loud on 06/27/2022 15:57:48 -------------------------------------------------------------------------------- Multi-Disciplinary Care Plan Details Patient Name: Date of Service: Robert Levine, [name] [facility] 06/27/2022 10:30 A M Medical Record Number: [id number] Patient Account Number: [id number] Date of Birth/Sex: Treating RN: 05-May-1955 (67 y.o. Seward Meth Primary Care Jamil Armwood: Annamary Rummage Other Clinician: Referring Linetta Regner: Treating Judeen Geralds/Extender: Roma Schanz Weeks in Treatment: 0 Active Inactive Necrotic Tissue Nursing Diagnoses: Impaired tissue integrity related to necrotic/devitalized tissue Knowledge deficit related to management of necrotic/devitalized tissue Goals: Necrotic/devitalized tissue will be minimized in the wound bed Date Initiated: 06/27/2022 Target Resolution Date: 07/28/2022 Goal Status: Active Patient/caregiver will verbalize understanding of reason and process for debridement of necrotic tissue Date Initiated: 06/27/2022 Target Resolution Date: 07/28/2022 Goal Status: Active Interventions: Assess patient pain level pre-, during and post procedure and prior to discharge Provide education on necrotic tissue and debridement process Treatment Activities: Excisional debridement : 06/27/2022 Notes: Orientation to the Wound Care Program Nursing Diagnoses: Knowledge deficit related to the wound healing center  program Goals: Patient/caregiver will verbalize understanding of the [place] Date Initiated: 06/27/2022 Target Resolution Date: 07/07/2022 Goal Status: Active Interventions: Provide education on orientation to the wound center Notes: Soft Tissue Infection Nursing Diagnoses: Impaired tissue integrity Knowledge deficit related to disease process and management Knowledge deficit related to home infection control: handwashing, handling of soiled dressings, supply storage Potential for infection: soft tissue Goals: Patient will remain free of wound infection Date Initiated: 06/27/2022 Target Resolution Date: 07/28/2022 Goal Status: Active Patient/caregiver will verbalize understanding of or measures to prevent infection and contamination in the home setting Date Initiated: 06/27/2022 Target Resolution Date: 07/28/2022 Goal Status: Active AGGIE, HOUSEHOLDER ([id number]) [id number] Page 6 of 9 Patient's soft tissue infection will resolve Date Initiated: 06/27/2022 Target Resolution Date: 07/28/2022 Goal Status: Active Signs and symptoms of infection will be recognized early to allow for prompt treatment Date Initiated: 06/27/2022 Target Resolution Date: 07/07/2022 Goal Status: Active Interventions: Assess signs and symptoms of infection every visit Provide education on infection Treatment Activities: Systemic antibiotics : 06/27/2022 Notes: Wound/Skin Impairment Nursing Diagnoses: Impaired  tissue integrity Knowledge deficit related to ulceration/compromised skin integrity Goals: Patient/caregiver will verbalize understanding of skin care regimen Date Initiated: 06/27/2022 Target Resolution Date: 07/28/2022 Goal Status: Active Ulcer/skin breakdown will have a volume reduction of 30% by week 4 Date Initiated: 06/27/2022 Target Resolution Date: 07/28/2022 Goal Status: Active Ulcer/skin breakdown will have a volume reduction of 50% by week 8 Date  Initiated: 06/27/2022 Target Resolution Date: 08/27/2022 Goal Status: Active Ulcer/skin breakdown will have a volume reduction of 80% by week 12 Date Initiated: 06/27/2022 Target Resolution Date: 09/27/2022 Goal Status: Active Ulcer/skin breakdown will heal within 14 weeks Date Initiated: 06/27/2022 Target Resolution Date: 10/11/2022 Goal Status: Active Interventions: Assess patient/caregiver ability to obtain necessary supplies Assess patient/caregiver ability to perform ulcer/skin care regimen upon admission and as needed Assess ulceration(s) every visit Provide education on ulcer and skin care Treatment Activities: Referred to DME [employer] [facility] for dressing supplies : 06/27/2022 Skin care regimen initiated : 06/27/2022 Notes: Electronic Signature(s) Signed: 06/27/2022 3:59:57 PM By: Rosalio Loud MSN RN CNS WTA Entered By: Rosalio Loud on 06/27/2022 15:59:57 -------------------------------------------------------------------------------- Pain Assessment Details Patient Name: Date of Service: Robert Levine, [name] 06/27/2022 10:30 [place] Record Number: [id number] Patient Account Number: [id number] Date of Birth/Sex: Treating RN: 1955/09/10 (67 y.o. Seward Meth Primary Care Haseeb Fiallos: Annamary Rummage Other Clinician: Referring Davie Claud: Treating Teyonna Plaisted/Extender: Roma Schanz Weeks in Treatment: 0 Active Problems Location of Pain Severity and Description of Pain Patient Has Paino No Site Locations [place], [place] ([id number]) [id number] Page 7 of 9 Pain Management and Medication Current Pain Management: Electronic Signature(s) Signed: 06/27/2022 4:12:46 PM By: Rosalio Loud MSN RN CNS WTA Entered By: Rosalio Loud on 06/27/2022 15:54:58 -------------------------------------------------------------------------------- Patient/Caregiver Education Details Patient Name: Date of Service: Robert Levine, [name] 3/27/2024andnbsp10:30 [place] Number: [id number] Patient Account Number: [id number] Date of Birth/Gender: Treating RN: 1955-12-08 (67 y.o. Seward Meth Primary Care Physician: Annamary Rummage Other Clinician: Referring Physician: Treating Physician/Extender: Ike Bene in Treatment: 0 Education Assessment Education Provided To: Patient Education Topics Provided Welcome T [facility]-New Patient Packet: o Handouts: Welcome T [facility] o Methods: Explain/Verbal Responses: State content correctly Wound Debridement: Handouts: Wound Debridement Methods: Explain/Verbal Responses: State content correctly Wound/Skin Impairment: Handouts: Caring for Your Ulcer Methods: Explain/Verbal Responses: State content correctly Electronic Signature(s) Signed: 06/27/2022 4:12:46 PM By: Rosalio Loud MSN RN CNS WTA Entered By: Rosalio Loud on 06/27/2022 16:00:04 -------------------------------------------------------------------------------- Wound Assessment Details Patient Name: Date of Service: Robert Levine, [name] [facility] 06/27/2022 10:30 A M Medical Record Number: [id number] Patient Account Number: [id number] Date of Birth/Sex: Treating RN: June 16, 1955 (67 y.o. 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 Wound Status: Open Wounding Event: Surgical Injury Comorbid History: Hypertension, Type II Diabetes Date Acquired: 06/03/2022 Weeks Of Treatment: 0 Clustered Wound: No Photos Wound Measurements Length: (cm) 2.7 Width: (cm) 1.5 Depth: (cm) 0.5 Area: (cm) 3.181 Volume: (cm) 1.59 % Reduction in Area: % Reduction in  Volume: Epithelialization: None Wound Description Classification: Grade 2 Exudate Amount: Medium Exudate Type: Serosanguineous Exudate Color: red, brown Foul Odor After Cleansing: No Slough/Fibrino No Wound Bed Granulation Amount: Medium (34-66%)  Exposed Structure Granulation Quality: Red, Pink Fascia Exposed: No Necrotic Amount: Small (1-33%) Fat Layer (Subcutaneous Tissue) Exposed: No Necrotic Quality: Eschar Tendon Exposed: No Muscle Exposed: No Joint Exposed: No Bone Exposed: No Treatment Notes Wound #2 (Amputation Site - Toe) Wound Laterality: Right Cleanser Soap and Water Discharge Instruction: Gently cleanse wound with antibacterial soap, rinse and pat dry prior to dressing wounds Peri-Wound Care Topical Primary Dressing Hydrofera Blue Ready Transfer Foam, 2.5x2.5 (in/in) Discharge Instruction: Apply Hydrofera Blue Ready to wound bed as directed Secondary Dressing ABD Pad 5x9 (in/in) Discharge Instruction: Cover with ABD pad Secured With [place] H Soft Cloth Surgical T ape ape, 2x2 (in/yd) Kerlix Roll Sterile or Non-Sterile 6-ply 4.5x4 (yd/yd) Discharge Instruction: Apply Kerlix as directed Compression Aldona Bar ([id number]) [id number] Page 9 of 9 Compression Stockings Add-Ons Electronic Signature(s) Signed: 06/27/2022 4:12:46 PM By: Rosalio Loud MSN RN CNS WTA Entered By: Rosalio Loud on 06/27/2022 11:17:50 -------------------------------------------------------------------------------- Vitals Details Patient Name: Date of Service: Robert Levine, [name] 06/27/2022 10:30 A M Medical Record Number: [id number] Patient Account Number: [id number] Date of Birth/Sex: Treating RN: 17-Feb-1956 (67 y.o. Seward Meth Primary Care Letcher Schweikert: Annamary Rummage Other Clinician: Referring Lourie Retz: Treating Sophira Rumler/Extender: Roma Schanz Weeks in Treatment: 0 Vital Signs Time Taken:  10:20 Temperature (F): 97.7 Height (in): 77 Pulse (bpm): 69 Source: Stated Respiratory Rate (breaths/min): 18 Weight (lbs): 257 Blood Pressure (mmHg): 145/86 Body Mass Index (BMI): 30.5 Reference Range: 80 - 120 mg / dl Electronic Signature(s) Signed: 06/27/2022 3:55:01 PM By: Rosalio Loud MSN RN CNS WTA Entered By: Rosalio Loud on 06/27/2022 15:55:01

## 2022-06-27 NOTE — Progress Notes (Signed)
KAYVIN, MOODY ([id number]) [id number] Page 1 of 9 Visit Report for 06/27/2022 Chief Complaint Document Details Patient Name: Date of Service: Robert Levine [facility] 06/27/2022 10:30 A M Medical Record Number: [id number] Patient Account Number: [id number] Date of Birth/Sex: Treating RN: 24-Aug-1955 (67 y.o. [name] Primary Care Provider: Annamary Rummage Other Clinician: Referring Provider: Treating Provider/Extender: Roma Schanz Weeks in Treatment: 0 Information Obtained from: Patient Chief Complaint 06/27/2022; Right great toe ulcer at amputation site Electronic Signature(s) Signed: 06/27/2022 11:48:34 AM By: Kalman Shan DO Signed: 06/27/2022 4:12:46 PM By: Rosalio Loud MSN RN CNS WTA Entered By: Rosalio Loud on 06/27/2022 11:42:29 -------------------------------------------------------------------------------- Debridement Details Patient Name: Date of Service: Robert Levine, [name] [facility] 06/27/2022 10:30 A M Medical Record Number: [id number] Patient Account Number: [id number] Date of Birth/Sex: Treating RN: January 25, 1956 (67 y.o. Seward Meth Primary Care Provider: Annamary Rummage Other Clinician: Referring Provider: Treating Provider/Extender: Roma Schanz Weeks in Treatment: 0 Debridement Performed for Assessment: Wound #2 Right Amputation Site - Toe Performed By: Physician Kalman Shan, MD Debridement Type: Debridement Severity of Tissue Pre Debridement: Fat layer exposed Level of Consciousness (Pre-procedure): Awake and Alert Pre-procedure Verification/Time Out No Taken: T Area Debrided (L x W): otal 2.5 (cm) x 0.2 (cm) = 0.5 (cm) Tissue and other material debrided: Viable, Non-Viable, Callus, Slough, Subcutaneous, Slough Level: Skin/Subcutaneous Tissue Debridement Description: Excisional Instrument: Curette Bleeding: Moderate Hemostasis Achieved: Pressure Response to Treatment: Procedure was  tolerated well Level of Consciousness (Post- Awake and Alert procedure): Post Debridement Measurements of Total Wound Length: (cm) 2.7 Width: (cm) 1.5 Depth: (cm) 0.5 Volume: (cm) 1.59 Character of Wound/Ulcer Post Debridement: Requires Further Debridement Severity of Tissue Post Debridement: Fat layer exposed Post Procedure Diagnosis Same as Pre-procedure Electronic Signature(s) Signed: 06/27/2022 11:48:34 AM By: Kalman Shan DO Signed: 06/27/2022 4:12:46 PM By: Rosalio Loud MSN RN CNS WTA Entered By: Rosalio Loud on 06/27/2022 11:13:16 Vicenta Dunning ([id number]) [id number] Page 2 of 9 -------------------------------------------------------------------------------- HPI Details Patient Name: Date of Service: Robert Levine [facility] 06/27/2022 10:30 A M Medical Record Number: [id number] Patient Account Number: [id number] Date of Birth/Sex: Treating RN: [birth year] (67 y.o. [name] Primary Care Provider: Annamary Rummage Other Clinician: Referring Provider: Treating Provider/Extender: Roma Schanz Weeks in Treatment: 0 History of Present Illness HPI Description: 01-15-2022 patient presents today as a referral from Dr. Caroline More who is a local podiatrist due to a foot wound that is not getting better. The patient unfortunately has had this going on for quite some time he is not necessarily the best historian which makes it a little bit difficult but nonetheless I did review notes as well together where things stand what is going on right now has been using Vaseline over the area according to what he tells me he has had debridements with Dr. Luana Shu intermittently. With that being said the patient has not really been using his offloading shoe as much she does have diabetic shoes and he tells me that is primarily what has been wearing. 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 He also has high blood pressure. 01-25-2022 upon evaluation today patient's wound actually showing signs of improvement though there is still little bit of callus and some biofilm and slough buildup on the  surface of the wound. I Minna perform sharp debridement to clear this away today. He tells me has been using the shoe a lot of the time though sometimes when he is at the assisted living facility he just walks around with a sock not using the shoe. I explained that he needs to make sure to always have the shoe on when he is up and moving around as can help this to heal most effectively. 02-01-2022 upon evaluation today patient appears to be doing well currently in regard to his wound from the standpoint of this not getting worse it does not look to be infected. With that being said unfortunately he does have an open wound that is going to require some sharp debridement today and again I am concerned that if we do not get this closed he is going to end up with this worsening in general. He has not really been wearing the postop shoe with front offloading unfortunately which means that he is not really getting much better. I think that a total contact cast is probably going to be the method in which to try to get this healed as quickly as possible. Again the sooner we get healed lessen the chance that this will develop into a more significant ulceration requiring any type of surgery. We especially would avoid any chance for amputation. 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 Here for application of TCC #1 [id number]; obligatory first total contact cast change. The patient had no particular problems. TCC reapplied 02-15-2022 upon evaluation today patient appears to be doing well currently in regard to his wound. There is some callus however buildup that is going to need to be cleared away. Fortunately  there does not appear to be any signs of infection this is actually his third cast application today although the first 2 were last week and this is already quite a bit smaller. 02-19-2022 upon evaluation today patient's wound actually is showing signs of doing well with the cast he is actually making good progress. Fortunately I do not see any evidence of infection locally or systemically at this time which is great news overall I think he is on the right track. 02-26-2022 upon evaluation today patient actually appears to be completely healed which is great news. Fortunately I see no evidence of active infection at this time locally or systemically which is great news as well. 06/27/2022 Mr. Monti Tashjian is a 67 year old male with a past medical history of uncontrolled insulin-dependent type 2 diabetes, previous amputation to the right great toe and stage III chronic kidney disease that presents the clinic for a 1 month history of nonhealing ulcer to the previous right great toe amputation site. He has been treated in our clinic for this issue previously with a total contact cast. He has a podiatrist. He resides in a nursing facility. They have recommended he come in for his wound care. He is fairly unaware of the wound and its condition. Electronic Signature(s) Signed: 06/27/2022 11:48:34 AM By: Kalman Shan DO Entered By: Kalman Shan on 06/27/2022 11:18:24 -------------------------------------------------------------------------------- Physical Exam Details Patient Name: Date of Service: Robert Levine [facility] 06/27/2022 10:30 A M Medical Record Number: [id number] Patient Account Number: [id number] Date of Birth/Sex: Treating RN: 1955-09-20 (67 y.o. [name] Primary Care Provider: Annamary Rummage Other Clinician: Referring Provider: Treating Provider/Extender: Rock Nephew, JO A N Weeks in Treatment:  0 Constitutional . Cardiovascular . Psychiatric . Notes Previous right great  toe amputation. T the plantar aspect of the previous amputation site there is a large open wound with nonviable tissue throughout. Mild GREGORY, KOLLMANN ([id number]) [id number] Page 3 of 9 odor on exam. Slight increase in erythema to the periwound. No purulent drainage. Electronic Signature(s) Signed: 06/27/2022 11:48:34 AM By: Kalman Shan DO Entered By: Kalman Shan on 06/27/2022 11:19:19 -------------------------------------------------------------------------------- Physician Orders Details Patient Name: Date of Service: Robert Levine, [name] [facility] 06/27/2022 10:30 A M Medical Record Number: [id number] Patient Account Number: [id number] Date of Birth/Sex: Treating RN: 20-Feb-1956 (67 y.o. [name] Primary Care Provider: Annamary Rummage Other Clinician: Referring Provider: Treating Provider/Extender: Roma Schanz Weeks in Treatment: 0 Verbal / Phone Orders: No Diagnosis Coding ICD-10 Coding Code Description E11.621 Type 2 diabetes mellitus with foot ulcer L97.512 Non-pressure chronic ulcer of other part of right foot with fat layer exposed Z89.411 Acquired absence of right great toe N18.30 Chronic kidney disease, stage 3 unspecified I10 Essential (primary) hypertension Follow-up Appointments Return Appointment in 2 weeks. Bathing/ [employer] wounds with antibacterial soap and water. Off-Loading Other: - Front off loader Wound Treatment Wound #2 - Amputation Site - Toe Wound Laterality: Right Cleanser: Soap and Water 1 x Per Day/30 Days Discharge Instructions: Gently cleanse wound with antibacterial soap, rinse and pat dry prior to dressing wounds Prim Dressing: Hydrofera Blue Ready Transfer Foam, 2.5x2.5 (in/in) (DME) (Dispense As Written) 1 x Per Day/30 Days ary Discharge Instructions: Apply Hydrofera Blue Ready to wound bed as  directed Secondary Dressing: ABD Pad 5x9 (in/in) (DME) (Generic) 1 x Per Day/30 Days Discharge Instructions: Cover with ABD pad Secured With: Medipore T - 72M Medipore H Soft Cloth Surgical T ape ape, 2x2 (in/yd) 1 x Per Day/30 Days Secured With: [employer] Sterile or Non-Sterile 6-ply 4.5x4 (yd/yd) (DME) (Generic) 1 x Per Day/30 Days Discharge Instructions: Apply Kerlix as directed Patient Medications llergies: empagliflozin A Notifications Medication Indication Start End 06/27/2022 amoxicillin-pot clavulanate DOSE 1 - oral 875 mg-125 mg tablet - 1 tablet oral twice a day x 7 days 06/27/2022 doxycycline hyclate DOSE 1 - oral 100 mg tablet - 1 tablet oral twice a day x 7 days Electronic Signature(s) Signed: 06/27/2022 4:05:43 PM By: Kalman Shan DO Signed: 06/27/2022 4:12:46 PM By: Rosalio Loud MSN RN CNS WTA Previous Signature: 06/27/2022 11:48:34 AM Version By: Kalman Shan DO Previous Signature: 06/27/2022 11:14:39 AM Version By: Kalman Shan DO Entered By: Rosalio Loud on 06/27/2022 15:58:22 Vicenta Dunning ([id number]) [id number] Page 4 of 9 Prescription 06/27/2022 -------------------------------------------------------------------------------- Lemar Lofty MD Patient Name: Provider: [birth year] [id number] Date of Birth: NPI#: M Sex: DEA #: [phone] [id number] Phone #: License #: [place] Patient Address: [facility] [address] [place], [place] [address] [address] [place], [place] [address] [phone] Allergies empagliflozin Medication Medication: Route: Strength: Form: amoxicillin 875 mg-potassium clavulanate 125 oral 875 mg-125 mg tablet mg tablet Class: PENICILLIN ANTIBIOTICS Dose: Frequency / Time: Indication: 1 1 tablet oral twice a day x 7 days Number of Refills: Number of Units: 0 Fourteen (14) Generic  Substitution: Start Date: End Date: One Time Use: Substitution Permitted [id number] No Note to Pharmacy: Hand Signature: Date(s): Prescription 06/27/2022 Lemar Lofty MD Patient Name: Provider: Aug 28, 1955 [id number] Date of Birth: NPI#: M Sex: DEA #: [phone] [id number] Phone #: License #: [place] Patient Address: [facility]  [place], [place] [address] [address] [place], [place] [address] [phone] Allergies empagliflozin Medication Medication: Route: Strength: Form: doxycycline hyclate 100 mg tablet oral 100 mg tablet Class: TETRACYCLINE ANTIBIOTICS Dose: Frequency / Time: Indication: 1 1 tablet oral twice a day x 7 days Number of Refills: Number of Units: 0 Fourteen (14) Generic Substitution: Start Date: End Date: One Time Use: Substitution Permitted [id number] No Note to Pharmacy: Hand Signature: Date(s): Electronic Signature(s) Signed: 06/27/2022 4:05:43 PM By: Kalman Shan DO Signed: 06/27/2022 4:12:46 PM By: Rosalio Loud MSN RN CNS WTA Previous Signature: 06/27/2022 11:48:34 AM Version By: Kalman Shan DO Entered By: Rosalio Loud on 06/27/2022 15:58:22 Problem List Details -------------------------------------------------------------------------------- Vicenta Dunning ([id number]) [id number] Page 5 of 9 Patient Name: Date of Service: Robert Levine [facility] 06/27/2022 10:30 A M Medical Record Number: [id number] Patient Account Number: [id number] Date of Birth/Sex: Treating RN: [birth year] (67 y.o. [name] Primary Care Provider: Annamary Rummage Other Clinician: Referring Provider: Treating Provider/Extender: Roma Schanz Weeks in Treatment: 0 Active Problems ICD-10 Encounter Code Description Active Date MDM Diagnosis E11.621 Type 2 diabetes mellitus with foot ulcer 06/27/2022 No  Yes L97.512 Non-pressure chronic ulcer of other part of right foot with fat layer exposed 06/27/2022 No Yes Z89.411 Acquired absence of right great toe 06/27/2022 No Yes N18.30 Chronic kidney disease, stage 3 unspecified 06/27/2022 No Yes I10 Essential (primary) hypertension 06/27/2022 No Yes Inactive Problems Resolved Problems Electronic Signature(s) Signed: 06/27/2022 11:48:34 AM By: Kalman Shan DO Signed: 06/27/2022 4:12:46 PM By: Rosalio Loud MSN RN CNS WTA Entered By: Rosalio Loud on 06/27/2022 11:41:31 -------------------------------------------------------------------------------- Progress Note Details Patient Name: Date of Service: Robert Levine, [name] 06/27/2022 10:30 A M Medical Record Number: [id number] Patient Account Number: [id number] Date of Birth/Sex: Treating RN: Aug 01, 1955 (67 y.o. [name] Primary Care Provider: Annamary Rummage Other Clinician: Referring Provider: Treating Provider/Extender: Roma Schanz Weeks in Treatment: 0 Subjective Chief Complaint Information obtained from Patient 06/27/2022; Right great toe ulcer at amputation site History of Present Illness (HPI) 01-15-2022 patient presents today as a referral from Dr. Caroline More who is a local podiatrist due to a foot wound that is not getting better. The patient unfortunately has had this going on for quite some time he is not necessarily the best historian which makes it a little bit difficult but nonetheless I did review notes as well together where things stand what is going on right now has been using Vaseline over the area according to what he tells me he has had debridements with Dr. Luana Shu intermittently. With that being said the patient has not really been using his offloading shoe as much she does have diabetic shoes and he tells me that is primarily what has been wearing. 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 He also has high blood pressure. 01-25-2022 upon evaluation today patient's wound actually showing signs of improvement though there is still little bit of callus and some biofilm and slough buildup on the surface of the wound. I Minna perform sharp debridement to clear this away today. He tells me has been using the shoe a lot of the time though sometimes when he is at the assisted living facility he just walks around with a sock not using the shoe. I explained that  he needs to make sure to always have the shoe on when he is up and moving around as can help this to heal most effectively. 02-01-2022 upon evaluation today patient appears to be doing well currently in regard to his wound from the standpoint of this not getting worse it does not look MUBARAK, PELON ([id number]) [id number] Page 6 of 9 to be infected. With that being said unfortunately he does have an open wound that is going to require some sharp debridement today and again I am concerned that if we do not get this closed he is going to end up with this worsening in general. He has not really been wearing the postop shoe with front offloading unfortunately which means that he is not really getting much better. I think that a total contact cast is probably going to be the method in which to try to get this healed as quickly as possible. Again the sooner we get healed lessen the chance that this will develop into a more significant ulceration requiring any type of surgery. We especially would avoid any chance for amputation. 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 Here for application of TCC #1 [id number]; obligatory first total contact cast change. The patient had no particular problems. TCC reapplied 02-15-2022 upon evaluation today patient appears to be doing well currently in regard to his wound. There is some  callus however buildup that is going to need to be cleared away. Fortunately there does not appear to be any signs of infection this is actually his third cast application today although the first 2 were last week and this is already quite a bit smaller. 02-19-2022 upon evaluation today patient's wound actually is showing signs of doing well with the cast he is actually making good progress. Fortunately I do not see any evidence of infection locally or systemically at this time which is great news overall I think he is on the right track. 02-26-2022 upon evaluation today patient actually appears to be completely healed which is great news. Fortunately I see no evidence of active infection at this time locally or systemically which is great news as well. 06/27/2022 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 He has been treated in our clinic for this issue previously with a total contact cast. He has a podiatrist. He resides in a nursing facility. They have recommended he come in for his wound care. He is fairly unaware of the wound and its condition. Patient History Information obtained from Patient. Allergies empagliflozin Social History Never smoker, Marital Status - Single, Alcohol Use - Never, Drug Use - No History, Caffeine Use - Daily. Medical History Cardiovascular Patient has history of Hypertension Endocrine Patient has history of Type II Diabetes Review of Systems (ROS) Eyes Complains or has symptoms of Glasses / Contacts - Glasses. Ear/Nose/Mouth/Throat Denies complaints or symptoms of Difficult clearing ears, Sinusitis. Hematologic/Lymphatic Denies complaints or symptoms of Bleeding / Clotting Disorders, Human  Immunodeficiency Virus. Respiratory Denies complaints or symptoms of Chronic or frequent coughs, Shortness of Breath. Objective Constitutional Vitals Time Taken: 10:20 AM, Height: 77 in, Source: Stated, Weight: 257 lbs, BMI: 30.5, Temperature: 97.7 F, Pulse: 69 bpm, Respiratory Rate: 18 breaths/min, Blood Pressure: 145/86 mmHg. General Notes: Previous right  great toe amputation. T the plantar aspect of the previous amputation site there is a large open wound with nonviable tissue o throughout. Mild odor on exam. Slight increase in erythema to the periwound. No purulent drainage. Integumentary (Hair, Skin) Wound #2 status is Open. Original cause of wound was Surgical Injury. The date acquired was: 06/03/2022. The wound is located on the Right Amputation Site - T The wound measures 2.7cm length x 1.5cm width x 0.5cm depth; 3.181cm^2 area and 1.59cm^3 volume. There is a medium amount of serosanguineous oe. drainage noted. There is medium (34-66%) red, pink granulation within the wound bed. There is a small (1-33%) amount of necrotic tissue within the wound bed including Eschar. 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 I debrided nonviable tissue. Due to odor and uncontrolled diabetes I recommended a course of antibiotics. We discussed that he is at high risk for infection and thus amputation. He expressed understanding. I recommend he follow-up with podiatry to obtain custom orthotics as now this is the second time  this has occurred. For now I recommended Hydrofera Blue daily dressing changes And front offloading shoe to help relieve pressure. Follow-up in 2 weeks. Procedures Wound #2 Pre-procedure diagnosis of Wound #2 is a Diabetic Wound/Ulcer of the Lower Extremity located on the Right Amputation Site - T .Severity of Tissue Pre oe Debridement is: Fat layer exposed. There was a Excisional Skin/Subcutaneous Tissue Debridement with a total area of 0.5 sq cm performed by Kalman Shan, MD. With the following instrument(s): Curette to remove Viable and Non-Viable tissue/material. Material removed includes Callus, Subcutaneous Tissue, and Slough. No specimens were taken.A Moderate amount of bleeding was controlled with Pressure. The procedure was tolerated well. Post Debridement Measurements: 2.7cm length x 1.5cm width x 0.5cm depth; 1.59cm^3 volume. Character of Wound/Ulcer Post Debridement requires further debridement. Severity of Tissue Post Debridement is: Fat layer exposed. Post procedure Diagnosis Wound #2: Same as Pre-Procedure Plan The following medication(s) was prescribed: amoxicillin-pot clavulanate oral 875 mg-125 mg tablet 1 1 tablet oral twice a day x 7 days starting 06/27/2022 doxycycline hyclate oral 100 mg tablet 1 1 tablet oral twice a day x 7 days starting 06/27/2022 1. In office sharp debridement 2. Augmentin and doxycycline 3. Hydrofera Blue 4. Front offloading shoe 5. Follow-up in 2 weeks Electronic Signature(s) Signed: 06/27/2022 11:48:34 AM By: Kalman Shan DO Entered By: Kalman Shan on 06/27/2022 11:21:58 -------------------------------------------------------------------------------- ROS/PFSH Details Patient Name: Date of Service: Robert Levine, [name] 06/27/2022 10:30 A M Medical Record Number: [id number] Patient Account Number: [id number] Date of Birth/Sex: Treating RN: 1955/09/07 (67 y.o. [place] Meth Primary Care Provider: Annamary Rummage Other  Clinician: Referring Provider: Treating Provider/Extender: Roma Schanz Weeks in Treatment: 0 Information Obtained From Patient Eyes Complaints and Symptoms: Positive for: Glasses / Contacts - Glasses Ear/Nose/Mouth/Throat Complaints and Symptoms: Negative for: Difficult clearing ears; Sinusitis Hematologic/Lymphatic Complaints and Symptoms: Negative for: Bleeding / Clotting Disorders; Human Immunodeficiency Virus Respiratory FAYSAL, BOURRET ([id number]) [id number] Page 8 of 9 Complaints and Symptoms: Negative for: Chronic or frequent coughs; Shortness of Breath Genitourinary Complaints  and Symptoms: Negative for: Kidney failure/ Dialysis; Incontinence/dribbling Medical History: Negative for: End Stage Renal Disease Past Medical History Notes: CKD3 Immunological Complaints and Symptoms: Negative for: Hives; Itching Integumentary (Skin) Complaints and Symptoms: Positive for: Wounds Negative for: Bleeding or bruising tendency; Breakdown; Swelling Medical History: Negative for: History of Burn; History of pressure wounds Musculoskeletal Complaints and Symptoms: Negative for: Muscle Pain; Muscle Weakness Neurologic Complaints and Symptoms: Negative for: Numbness/parasthesias; Focal/Weakness Psychiatric Complaints and Symptoms: Negative for: Anxiety; Claustrophobia Cardiovascular Medical History: Positive for: Hypertension Endocrine Medical History: Positive for: Type II Diabetes Time with diabetes: 5 years Treated with: Insulin Oncologic Immunizations Pneumococcal Vaccine: Received Pneumococcal Vaccination: Yes Received Pneumococcal Vaccination On or After 60th Birthday: Yes Implantable Devices None Family and Social History Never smoker; Marital Status - Single; Alcohol Use: Never; Drug Use: No History; Caffeine Use: Daily Electronic Signature(s) Signed: 06/27/2022 4:05:43 PM By: Kalman Shan DO Signed:  06/27/2022 4:12:46 PM By: Rosalio Loud MSN RN CNS WTA Previous Signature: 06/27/2022 11:48:34 AM Version By: Kalman Shan DO Entered By: Rosalio Loud on 06/27/2022 15:56:58 Vicenta Dunning ([id number]) [id number] Page 9 of 9 -------------------------------------------------------------------------------- SuperBill Details Patient Name: Date of Service: Robert Levine [facility] 06/27/2022 Medical Record Number: [id number] Patient Account Number: [id number] Date of Birth/Sex: Treating RN: 10-Oct-1955 (67 y.o. 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 N18.30 Chronic kidney disease, stage 3 unspecified I10 Essential (primary) hypertension Facility Procedures : CPT4 Code: [id number] Description: [id number] - WOUND CARE VISIT-LEV 3 EST PT Modifier: Quantity: 1 : CPT4 Code: [id number] Description: [id number] - DEB SUBQ TISSUE 20 SQ CM/< ICD-10 Diagnosis Description E11.621 Type 2 diabetes mellitus with foot ulcer L97.512 Non-pressure chronic ulcer of other part of right foot with fat layer exposed Modifier: Quantity: 1 Physician Procedures : CPT4 Code Description Modifier [id number] - WC PHYS LEVEL 4 - EST PT ICD-10 Diagnosis Description E11.621 Type 2 diabetes mellitus with foot ulcer L97.512 Non-pressure chronic ulcer of other part of right foot with fat layer exposed Z89.411 Acquired  absence of right great toe N18.30 Chronic kidney disease, stage 3 unspecified Quantity: 1 : [id number] - WC PHYS SUBQ TISS 20 SQ CM ICD-10 Diagnosis Description E11.621 Type 2 diabetes mellitus with foot ulcer L97.512 Non-pressure  chronic ulcer of other part of right foot with fat layer exposed Quantity: 1 Electronic Signature(s) Signed: 06/27/2022 3:59:48 PM By: Rosalio Loud MSN RN CNS WTA Signed: 06/27/2022 4:05:43 PM By: Kalman Shan DO Previous Signature: 06/27/2022 11:48:34 AM Version By: Kalman Shan DO Entered By: Rosalio Loud on 06/27/2022 15:59:47

## 2022-07-11 ENCOUNTER — Encounter: Payer: Medicare Other | Attending: Internal Medicine | Admitting: Internal Medicine

## 2022-07-11 DIAGNOSIS — N183 Chronic kidney disease, stage 3 unspecified: Secondary | ICD-10-CM | POA: Insufficient documentation

## 2022-07-11 DIAGNOSIS — I129 Hypertensive chronic kidney disease with stage 1 through stage 4 chronic kidney disease, or unspecified chronic kidney disease: Secondary | ICD-10-CM | POA: Insufficient documentation

## 2022-07-11 DIAGNOSIS — Z89411 Acquired absence of right great toe: Secondary | ICD-10-CM | POA: Diagnosis not present

## 2022-07-11 DIAGNOSIS — E11621 Type 2 diabetes mellitus with foot ulcer: Secondary | ICD-10-CM

## 2022-07-11 DIAGNOSIS — E1122 Type 2 diabetes mellitus with diabetic chronic kidney disease: Secondary | ICD-10-CM | POA: Insufficient documentation

## 2022-07-11 DIAGNOSIS — E1142 Type 2 diabetes mellitus with diabetic polyneuropathy: Secondary | ICD-10-CM | POA: Diagnosis not present

## 2022-07-11 DIAGNOSIS — L97512 Non-pressure chronic ulcer of other part of right foot with fat layer exposed: Secondary | ICD-10-CM | POA: Insufficient documentation

## 2022-07-12 NOTE — Progress Notes (Signed)
Robert Levine, Robert Levine ([id number]) [id number] Page 1 of 8 Visit Report for 07/11/2022 Arrival Information Details Patient Name: Date of Service: Robert Levine [facility] 07/11/2022 11:00 A M Medical Record Number: [id number] Patient Account Number: [id number] Date of Birth/Sex: Treating RN: [birth year] (67 y.o. [name] Primary Care Bess Saltzman: Ilsa Iha Other Clinician: Betha Loa Referring Tahtiana Rozier: Treating Dareion Kneece/Extender: Ammie Ferrier in Treatment: 2 Visit Information History Since Last Visit All ordered tests and consults were completed: No Patient Arrived: Robert Levine Added or deleted any medications: No Arrival Time: 11:08 Any new allergies or adverse reactions: No Transfer Assistance: None Had a fall or experienced change in No Patient Identification Verified: Yes activities of daily living that may affect Secondary Verification Process Completed: Yes risk of falls: Patient Requires Transmission-Based Precautions: No Signs or symptoms of abuse/neglect since last visito No Patient Has Alerts: Yes Hospitalized since last visit: No Patient Alerts: DM II Implantable device outside of the clinic excluding No cellular tissue based products placed in the center since last visit: Has Dressing in Place as Prescribed: Yes Has Footwear/Offloading in Place as Prescribed: Yes Right: Wedge Shoe Pain Present Now: No Electronic Signature(s) Signed: 07/11/2022 4:41:17 PM By: Betha Loa Entered By: Betha Loa on 07/11/2022 11:14:23 -------------------------------------------------------------------------------- Clinic Level of Care Assessment Details Patient Name: Date of Service: Robert Levine [facility] 07/11/2022 11:00 A M Medical Record Number: [id number] Patient Account Number: [id number] Date of Birth/Sex: Treating RN: 1955-07-06 (67 y.o. [name] Primary Care [name]: Ilsa Iha Other Clinician: Betha Loa Referring  [name]: Treating [name]/Extender: Ammie Ferrier in Treatment: 2 Clinic Level of Care Assessment Items TOOL 1 Quantity Score []  - 0 Use when EandM and Procedure is performed on INITIAL visit ASSESSMENTS - Nursing Assessment / Reassessment []  - 0 General Physical Exam (combine w/ comprehensive assessment (listed just below) when performed on new pt. evals) []  - 0 Comprehensive Assessment (HX, ROS, Risk Assessments, Wounds Hx, etc.) ASSESSMENTS - Wound and Skin Assessment / Reassessment []  - 0 Dermatologic / Skin Assessment (not related to wound area) ASSESSMENTS - Ostomy and/or Continence Assessment and Care []  - 0 Incontinence Assessment and Management []  - 0 Ostomy Care Assessment and Management (repouching, etc.) PROCESS - Coordination of Care []  - 0 Simple Patient / Family Education for ongoing care []  - 0 Complex (extensive) Patient / Family Education for ongoing care []  - 0 Staff obtains Chiropractor, Records, T Results / Process Orders est []  - 0 Staff telephones HHA, Nursing Homes / Clarify orders / etc []  - 0 Routine Transfer to another Facility (non-emergent condition) [name] ([id number]) [id number] Page 2 of 8 []  - 0 Routine Hospital Admission (non-emergent condition) []  - 0 New Admissions / Manufacturing engineer / Ordering NPWT Apligraf, etc. , []  - 0 Emergency Hospital Admission (emergent condition) PROCESS - Special Needs []  - 0 Pediatric / Minor Patient Management []  - 0 Isolation Patient Management []  - 0 Hearing / Language / Visual special needs []  - 0 Assessment of Community assistance (transportation, D/C planning, etc.) []  - 0 Additional assistance / Altered mentation []  - 0 Support Surface(s) Assessment (bed, cushion, seat, etc.) INTERVENTIONS - Miscellaneous []  - 0 External ear exam []  - 0 Patient Transfer (multiple staff / Nurse, adult / Similar devices) []  - 0 Simple Staple /  Suture removal (25 or less) []  - 0 Complex Staple / Suture removal (26 or more) []  - 0 Hypo/Hyperglycemic Management (do not check if billed separately) []  - 0 Ankle /  Brachial Index (ABI) - do not check if billed separately Has the patient been seen at the hospital within the last three years: Yes Total Score: 0 Level Of Care: ____ Electronic Signature(s) Signed: 07/11/2022 4:41:17 PM By: Betha Loa Entered By: Betha Loa on 07/11/2022 11:32:13 -------------------------------------------------------------------------------- Encounter Discharge Information Details Patient Name: Date of Service: Robert Levine, Robert Levine [facility] 07/11/2022 11:00 A M Medical Record Number: [id number] Patient Account Number: [id number] Date of Birth/Sex: Treating RN: 1955/10/13 (67 y.o. [name] Primary Care [name]: Ilsa Iha Other Clinician: Betha Loa Referring [name]: Treating [name]/Extender: Ammie Ferrier in Treatment: 2 Encounter Discharge Information Items Post Procedure Vitals Discharge Condition: Stable Temperature (F): 98.0 Ambulatory Status: Walker Pulse (bpm): 71 Discharge Destination: Home Respiratory Rate (breaths/min): 18 Transportation: Other Blood Pressure (mmHg): 149/79 Accompanied By: self Schedule Follow-up Appointment: Yes Clinical Summary of Care: Electronic Signature(s) Signed: 07/11/2022 4:41:17 PM By: Betha Loa Entered By: Betha Loa on 07/11/2022 12:59:57 -------------------------------------------------------------------------------- Lower Extremity Assessment Details Patient Name: Date of Service: Robert Levine [facility] 07/11/2022 11:00 A M Medical Record Number: [id number] Patient Account Number: [id number] Date of Birth/Sex: Treating RN: July 20, 1955 (67 y.o. [name] Primary Care Robert Levine: Ilsa Iha Other Clinician: Betha Loa Referring [name]: Treating [name]/Extender: Ammie Ferrier  in Treatment: 2 [name] ([id number]) [id number] Page 3 of 8 Edema Assessment Assessed: [Left: No] [Right: Yes] Edema: [Left: Ye] [Right: s] Calf Left: Right: Point of Measurement: 38 cm From Medial Instep 40 cm Ankle Left: Right: Point of Measurement: 12 cm From Medial Instep 28.5 cm Vascular Assessment Pulses: Dorsalis Pedis Palpable: [Right:Yes] Electronic Signature(s) Signed: 07/11/2022 4:41:17 PM By: Betha Loa Signed: 07/11/2022 5:50:22 PM By: Elliot Gurney, [name], RN, CWS, [name], [name] Entered By: Betha Loa on 07/11/2022 11:21:58 -------------------------------------------------------------------------------- Multi Wound Chart Details Patient Name: Date of Service: Robert Levine, Robert [facility] 07/11/2022 11:00 A M Medical Record Number: [id number] Patient Account Number: [id number] Date of Birth/Sex: Treating RN: 1956-01-18 (67 y.o. [name] Primary Care Ardice Boyan: Ilsa Iha Other Clinician: Betha Loa Referring Thecla Forgione: Treating Aryeh Butterfield/Extender: Ammie Ferrier in Treatment: 2 Vital Signs Height(in): 77 Pulse(bpm): 71 Weight(lbs): 257 Blood Pressure(mmHg): 149/79 Body Mass Index(BMI): 30.5 Temperature(F): 98.0 Respiratory Rate(breaths/min): 16 [2:Photos:] [N/A:N/A] Right Amputation Site - Toe N/A N/A Wound Location: Surgical Injury N/A N/A Wounding Event: Diabetic Wound/Ulcer of the Lower N/A N/A Primary Etiology: Extremity Hypertension, Type II Diabetes N/A N/A Comorbid History: 06/03/2022 N/A N/A Date Acquired: 2 N/A N/A Weeks of Treatment: Open N/A N/A Wound Status: No N/A N/A Wound Recurrence: 0.5x0.6x0.1 N/A N/A Measurements L x W x D (cm) 0.236 N/A N/A A (cm) : rea 0.024 N/A N/A Volume (cm) : 92.60% N/A N/A % Reduction in A rea: 98.50% N/A N/A % Reduction in Volume: Grade 2 N/A N/A Classification: Medium N/A N/A Exudate A mount: Serosanguineous N/A N/A Exudate Type: red,  brown N/A N/A Exudate Color: Medium (34-66%) N/A N/A Granulation A mount: Red, Pink N/A N/A Granulation QualityHALO, LASKI ([id number]) [phone].pdf Page 4 of 8 Small (1-33%) N/A N/A Necrotic Amount: Eschar N/A N/A Necrotic Tissue: Fascia: No N/A N/A Exposed Structures: Fat Layer (Subcutaneous Tissue): No Tendon: No Muscle: No Joint: No Bone: No None N/A N/A Epithelialization: Debridement - Excisional N/A N/A Debridement: Pre-procedure Verification/Time Out 11:25 N/A N/A Taken: Callus, Subcutaneous, Slough N/A N/A Tissue Debrided: Skin/Subcutaneous Tissue N/A N/A Level: 30 N/A N/A Debridement A (sq cm): rea Curette N/A N/A Instrument: Minimum N/A N/A Bleeding: Pressure N/A N/A Hemostasis A  chieved: Procedure was tolerated well N/A N/A Debridement Treatment Response: 0.5x0.6x0.1 N/A N/A Post Debridement Measurements L x W x D (cm) 0.024 N/A N/A Post Debridement Volume: (cm) Debridement N/A N/A Procedures Performed: Treatment Notes Electronic Signature(s) Signed: 07/11/2022 11:59:33 AM By: Geralyn Corwin DO Entered By: Geralyn Corwin on 07/11/2022 11:42:47 -------------------------------------------------------------------------------- Multi-Disciplinary Care Plan Details Patient Name: Date of Service: Robert Levine, Robert [facility] 07/11/2022 11:00 A M Medical Record Number: [id number] Patient Account Number: [id number] Date of Birth/Sex: Treating RN: January 03, 1956 (67 y.o. [name], [name] Primary Care Penelopi Mikrut: Ilsa Iha Other Clinician: Betha Loa Referring Damarko Stitely: Treating Staceyann Knouff/Extender: Ammie Ferrier in Treatment: 2 Active Inactive Necrotic Tissue Nursing Diagnoses: Impaired tissue integrity related to necrotic/devitalized tissue Knowledge deficit related to management of necrotic/devitalized tissue Goals: Necrotic/devitalized tissue will be minimized in the wound bed Date Initiated:  06/27/2022 Target Resolution Date: 07/28/2022 Goal Status: Active Patient/caregiver will verbalize understanding of reason and process for debridement of necrotic tissue Date Initiated: 06/27/2022 Target Resolution Date: 07/28/2022 Goal Status: Active Interventions: Assess patient pain level pre-, during and post procedure and prior to discharge Provide education on necrotic tissue and debridement process Treatment Activities: Excisional debridement : 06/27/2022 Notes: Orientation to the Wound Care Program Nursing Diagnoses: Knowledge deficit related to the wound healing center program Goals: Patient/caregiver will verbalize understanding of the Wound Healing Center Program Date Initiated: 06/27/2022 Target Resolution Date: 07/07/2022 Goal Status: Active EMANUEL, DOWSON ([id number]) [phone].pdf Page 5 of 8 Interventions: Provide education on orientation to the wound center Notes: Soft Tissue Infection Nursing Diagnoses: Impaired tissue integrity Knowledge deficit related to disease process and management Knowledge deficit related to home infection control: handwashing, handling of soiled dressings, supply storage Potential for infection: soft tissue Goals: Patient will remain free of wound infection Date Initiated: 06/27/2022 Target Resolution Date: 07/28/2022 Goal Status: Active Patient/caregiver will verbalize understanding of or measures to prevent infection and contamination in the home setting Date Initiated: 06/27/2022 Target Resolution Date: 07/28/2022 Goal Status: Active Patient's soft tissue infection will resolve Date Initiated: 06/27/2022 Target Resolution Date: 07/28/2022 Goal Status: Active Signs and symptoms of infection will be recognized early to allow for prompt treatment Date Initiated: 06/27/2022 Target Resolution Date: 07/07/2022 Goal Status: Active Interventions: Assess signs and symptoms of infection every visit Provide education on  infection Treatment Activities: Systemic antibiotics : 06/27/2022 Notes: Wound/Skin Impairment Nursing Diagnoses: Impaired tissue integrity Knowledge deficit related to ulceration/compromised skin integrity Goals: Patient/caregiver will verbalize understanding of skin care regimen Date Initiated: 06/27/2022 Target Resolution Date: 07/28/2022 Goal Status: Active Ulcer/skin breakdown will have a volume reduction of 30% by week 4 Date Initiated: 06/27/2022 Target Resolution Date: 07/28/2022 Goal Status: Active Ulcer/skin breakdown will have a volume reduction of 50% by week 8 Date Initiated: 06/27/2022 Target Resolution Date: 08/27/2022 Goal Status: Active Ulcer/skin breakdown will have a volume reduction of 80% by week 12 Date Initiated: 06/27/2022 Target Resolution Date: 09/27/2022 Goal Status: Active Ulcer/skin breakdown will heal within 14 weeks Date Initiated: 06/27/2022 Target Resolution Date: 10/11/2022 Goal Status: Active Interventions: Assess patient/caregiver ability to obtain necessary supplies Assess patient/caregiver ability to perform ulcer/skin care regimen upon admission and as needed Assess ulceration(s) every visit Provide education on ulcer and skin care Treatment Activities: Referred to DME Katyra Tomassetti for dressing supplies : 06/27/2022 Skin care regimen initiated : 06/27/2022 Notes: Electronic Signature(s) Signed: 07/11/2022 4:41:17 PM By: Betha Loa Signed: 07/11/2022 5:50:22 PM By: Elliot Gurney, [name], RN, CWS, [name], [name] Entered By: Betha Loa on 07/11/2022 11:33:01 [name] ([id number]) [phone].pdf Page  6 of 8 -------------------------------------------------------------------------------- Pain Assessment Details Patient Name: Date of Service: [facility]HA [facility]11[facility] 11:00 A M Medical Record Number: [id number] Patient Account Number: [id number] Date of Birth/Sex: Treating RN: Jun 30, 1955 (67 y.o. Arthur HolmsM) Woody, [name] Primary Care  Adalena Abdulla: Ilsa Ihaackett, Joan Other Clinician: Betha LoaVenable, Angie Referring Adleigh Mcmasters: Treating Conchita Truxillo/Extender: Ammie FerrierHoffman, Jessica Tackett, Joan Weeks in Treatment: 2 Active Problems Location of Pain Severity and Description of Pain Patient Has Paino No Site Locations Pain Management and Medication Current Pain Management: Electronic Signature(s) Signed: 07/11/2022 4:41:17 PM By: Betha LoaVenable, Angie Signed: 07/11/2022 5:50:22 PM By: Elliot GurneyWoody, [name], RN, CWS, [name], [name] Entered By: Betha LoaVenable, Angie on 07/11/2022 11:16:43 -------------------------------------------------------------------------------- Patient/Caregiver Education Details Patient Name: Date of Service: Robert FraiseBURNHA M, Robert [facility] 4/10/2024andnbsp11:00 A M Medical Record Number: [id number] Patient Account Number: [id number] Date of Birth/Gender: Treating RN: Jun 30, 1955 [age] y.o. Arthur HolmsM) Woody, [name] Primary Care Physician: Ilsa Ihaackett, Joan Other Clinician: Betha LoaVenable, Angie Referring Physician: Treating Physician/Extender: Ammie FerrierHoffman, Jessica Tackett, Joan Weeks in Treatment: 2 Education Assessment Education Provided To: Patient Education Topics Provided Wound/Skin Impairment: Handouts: Other: continue wound care as directed Methods: Explain/Verbal Responses: State content correctly Electronic Signature(s) Signed: 07/11/2022 4:41:17 PM By: Betha LoaVenable, Angie Entered By: Betha LoaVenable, Angie on 07/11/2022 12:59:11 Roxy CedarBURNHAM, Kamen ([id number]) [id number] Page 7 of 8 -------------------------------------------------------------------------------- Wound Assessment Details Patient Name: Date of Service: Robert FantasiaBURNHA M, [facility]/11/20[facility] 11:00 A M Medical Record Number: [id number] Patient Account Number: [id number] Date of Birth/Sex: Treating RN: Jun 30, 1955 (67 y.o. 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 Wound Status: Open Wounding Event: Surgical Injury Comorbid History: Hypertension, Type II Diabetes Date Acquired: 06/03/2022 Weeks Of Treatment: 2 Clustered Wound: No Photos Wound Measurements Length: (cm) 0.5 Width: (cm) 0.6 Depth: (cm) 0.1 Area: (cm) 0.236 Volume: (cm) 0.024 % Reduction in Area: 92.6% % Reduction in Volume: 98.5% Epithelialization: None Wound Description Classification: Grade 2 Exudate Amount: Medium Exudate Type: Serosanguineous Exudate Color: red, brown Foul Odor After Cleansing: No Slough/Fibrino No Wound Bed Granulation Amount: Medium (34-66%) Exposed Structure Granulation Quality: Red, Pink Fascia Exposed: No Necrotic Amount: Small (1-33%) Fat Layer (Subcutaneous Tissue) Exposed: No Necrotic Quality: Eschar Tendon Exposed: No Muscle Exposed: No Joint Exposed: No Bone Exposed: No Treatment Notes Wound #2 (Amputation Site - Toe) Wound Laterality: Right Cleanser Soap and Water Discharge Instruction: Gently cleanse wound with antibacterial soap, rinse and pat dry prior to dressing wounds Peri-Wound Care Topical Primary Dressing Hydrofera Blue Ready Transfer Foam, 2.5x2.5 (in/in) Discharge Instruction: Apply Hydrofera Blue Ready to wound bed as directed Secondary Dressing Roxy CedarBURNHAM, Antero ([id number]) [id number] Page 8 of 8 ABD Pad 5x9 (in/in) Discharge Instruction: Cover with ABD pad Secured With Medipore T - 68M Medipore H Soft Cloth Surgical T ape ape, 2x2 (in/yd) Kerlix Roll Sterile or Non-Sterile 6-ply 4.5x4 (yd/yd) Discharge Instruction: Apply Kerlix as directed Compression Wrap Compression Stockings Add-Ons Electronic Signature(s) Signed: 07/11/2022 4:41:17 PM By: Betha LoaVenable, Angie Signed: 07/11/2022 5:50:22 PM By: Elliot GurneyWoody, [name], RN, CWS, [name], [name] Entered By: Betha LoaVenable, Angie  on 07/11/2022 11:20:30 -------------------------------------------------------------------------------- Vitals Details Patient Name: Date of Service: Robert FraiseBURNHA M, Robert [facility] 07/11/2022 11:00 A M Medical Record Number: [id number] Patient Account Number: [id number] Date of Birth/Sex: Treating RN: Jun 30, 1955 [age] y.o. Arthur HolmsM) Woody, [name] Primary Care Felipa Laroche: Ilsa Ihaackett, Joan Other Clinician: Betha LoaVenable, Angie Referring Lucetta Baehr: Treating Raiven Belizaire/Extender: Ammie FerrierHoffman, Jessica Tackett, Joan Weeks  in Treatment: 2 Vital Signs Time Taken: 11:14 Temperature (F): 98.0 Height (in): 77 Pulse (bpm): 71 Weight (lbs): 257 Respiratory Rate (breaths/min): 16 Body Mass Index (BMI): 30.5 Blood Pressure (mmHg): 149/79 Reference Range: 80 - 120 mg / dl Electronic Signature(s) Signed: 07/11/2022 4:41:17 PM By: Betha Loa Entered By: Betha Loa on 07/11/2022 11:16:40

## 2022-07-12 NOTE — Progress Notes (Signed)
Robert Levine ([id number]) [id number] Page 1 of 7 Visit Report for 07/11/2022 Chief Complaint Document Details Patient Name: Date of Service: Robert Levine [facility] 07/11/2022 11:00 A M Medical Record Number: [id number] Patient Account Number: [id number] Date of Birth/Sex: Treating Levine: Aug 09, 1955 (67 y.o. Robert Levine Primary Care Provider: Ilsa Levine Other Clinician: Betha Levine Referring Provider: Treating Provider/Extender: Robert Levine in Treatment: 2 Information Obtained from: Patient Chief Complaint 06/27/2022; Right great toe ulcer at amputation site Electronic Signature(s) Signed: 07/11/2022 11:59:33 AM By: Robert Corwin DO Entered By: Robert Levine on 07/11/2022 11:34:18 -------------------------------------------------------------------------------- Debridement Details Patient Name: Date of Service: Robert Levine, Robert Levine 07/11/2022 11:00 A M Medical Record Number: [id number] Patient Account Number: [id number] Date of Birth/Sex: Treating Levine: [birth year] (67 y.o. Robert Levine, Robert Levine Primary Care Provider: Ilsa Levine Other Clinician: Betha Levine Referring Provider: Treating Provider/Extender: Robert Levine in Treatment: 2 Debridement Performed for Assessment: Wound #2 Right Amputation Site - Toe Performed By: Physician Robert Corwin, MD Debridement Type: Debridement Severity of Tissue Pre Debridement: Fat layer exposed Level of Consciousness (Pre-procedure): Awake and Alert Pre-procedure Verification/Time Out Yes - 11:25 Taken: Start Time: 11:25 T Area Debrided (L x W): otal 6 (cm) x 5 (cm) = 30 (cm) Tissue and other material debrided: Viable, Non-Viable, Callus, Slough, Subcutaneous, Slough Level: Skin/Subcutaneous Tissue Debridement Description: Excisional Instrument: Curette Bleeding: Minimum Hemostasis Achieved: Pressure Response to Treatment: Procedure was tolerated well Level of  Consciousness (Post- Awake and Alert procedure): Post Debridement Measurements of Total Wound Length: (cm) 0.5 Width: (cm) 0.6 Depth: (cm) 0.1 Volume: (cm) 0.024 Character of Wound/Ulcer Post Debridement: Stable Severity of Tissue Post Debridement: Fat layer exposed Post Procedure Diagnosis Same as Pre-procedure Electronic Signature(s) Signed: 07/11/2022 11:59:33 AM By: Robert Corwin DO Signed: 07/11/2022 4:41:17 PM By: Robert Levine Signed: 07/11/2022 5:50:22 PM By: Robert Levine, [name], Levine, CWS, Robert Levine, [name] Entered By: Robert Levine on 07/11/2022 11:31:42 Robert Levine ([id number]) [id number] Page 2 of 7 -------------------------------------------------------------------------------- HPI Details Patient Name: Date of Service: Robert Levine [facility] 07/11/2022 11:00 A M Medical Record Number: [id number] Patient Account Number: [id number] Date of Birth/Sex: Treating Levine: 31-Aug-1955 (67 y.o. Robert Levine Primary Care Provider: Ilsa Levine Other Clinician: Betha Levine Referring Provider: Treating Provider/Extender: Robert Levine in Treatment: 2 History of Present Illness HPI Description: 01-15-2022 patient presents today as a referral from Dr. Rosetta Posner who is a local podiatrist due to a foot wound that is not getting better. The patient unfortunately has had this going on for quite some time he is not necessarily the best historian which makes it a little bit difficult but nonetheless I did review notes as well together where things stand what is going on right now has been using Vaseline over the area according to what he tells me he has had debridements with Dr. Excell Seltzer intermittently. With that being said the patient has not really been using his offloading shoe as much she does have diabetic shoes and he tells me that is primarily what has been wearing. 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 He also has high blood pressure. 01-25-2022 upon evaluation today patient's wound actually showing signs of improvement though there is still little bit of callus and some biofilm and slough buildup on the surface of the wound. I Minna perform sharp debridement to clear this  away today. He tells me has been using the shoe a lot of the time though sometimes when he is at the assisted living facility he just walks around with a sock not using the shoe. I explained that he needs to make sure to always have the shoe on when he is up and moving around as can help this to heal most effectively. 02-01-2022 upon evaluation today patient appears to be doing well currently in regard to his wound from the standpoint of this not getting worse it does not look to be infected. With that being said unfortunately he does have an open wound that is going to require some sharp debridement today and again I am concerned that if we do not get this closed he is going to end up with this worsening in general. He has not really been wearing the postop shoe with front offloading unfortunately which means that he is not really getting much better. I think that a total contact cast is probably going to be the method in which to try to get this healed as quickly as possible. Again the sooner we get healed lessen the chance that this will develop into a more significant ulceration requiring any type of surgery. We especially would avoid any chance for amputation. 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 Here for application of TCC #1 11/9; obligatory first total contact cast change. The patient had no particular problems. TCC reapplied 02-15-2022 upon evaluation today patient appears to be doing well currently in regard to his wound. There is some callus however buildup that is going to need to be  cleared away. Fortunately there does not appear to be any signs of infection this is actually his third cast application today although the first 2 were last week and this is already quite a bit smaller. 02-19-2022 upon evaluation today patient's wound actually is showing signs of doing well with the cast he is actually making good progress. Fortunately I do not see any evidence of infection locally or systemically at this time which is great news overall I think he is on the right track. 02-26-2022 upon evaluation today patient actually appears to be completely healed which is great news. Fortunately I see no evidence of active infection at this time locally or systemically which is great news as well. 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 He has been treated in our clinic for this issue previously with a total contact cast. He has a podiatrist. He resides in a nursing facility. They have recommended he come in for his wound care. He is fairly unaware of the wound and its condition. 4/10; patient presents for follow-up. He has been using Hydrofera Blue to the wound bed along with his front offloading shoe on the right foot. He has taken his oral antibiotics without issues. Electronic Signature(s) Signed: 07/11/2022 11:59:33 AM By: Robert Corwin DO Entered By: Robert Levine on 07/11/2022 11:38:22 -------------------------------------------------------------------------------- Physical Exam Details Patient Name: Date of Service: Robert Levine [facility] 07/11/2022 11:00 A M Medical Record Number: [id number] Patient Account Number: [id number] Date of Birth/Sex: Treating Levine: [birth year] (67 y.o. Robert Levine Primary  Care Provider: Ilsa Levine Other Clinician: Betha Levine Referring Provider: Treating Provider/Extender: Robert Levine,  Robert Levine, Robert Levine in Treatment: 2 Constitutional . Cardiovascular . Psychiatric . Robert Levine, Robert Levine ([id number]) [id number] Page 3 of 7 Notes Previous right great toe amputation. T the plantar aspect of the previous amputation site there is an open wound with granualtion tissue, non viable tissue and o significant callus. No signs of surrounding infection. Electronic Signature(s) Signed: 07/11/2022 11:59:33 AM By: Robert Corwin DO Entered By: Robert Levine on 07/11/2022 11:38:38 -------------------------------------------------------------------------------- Physician Orders Details Patient Name: Date of Service: Robert Levine, Robert [facility] 07/11/2022 11:00 A M Medical Record Number: [id number] Patient Account Number: [id number] Date of Birth/Sex: Treating Levine: 1955/06/19 (67 y.o. Robert Levine Primary Care Provider: Ilsa Levine Other Clinician: Betha Levine Referring Provider: Treating Provider/Extender: Robert Levine in Treatment: 2 Verbal / Phone Orders: Yes Clinician: Huel Coventry Read Back and Verified: Yes Diagnosis Coding Follow-up Appointments Return Appointment in 2 Levine. Bathing/ [employer] wounds with antibacterial soap and water. Off-Loading Other: - Front off loader Wound Treatment Wound #2 - Amputation Site - Toe Wound Laterality: Right Cleanser: Soap and Water 1 x Per Day/30 Days Discharge Instructions: Gently cleanse wound with antibacterial soap, rinse and pat dry prior to dressing wounds Prim Dressing: Hydrofera Blue Ready Transfer Foam, 2.5x2.5 (in/in) (Dispense As Written) 1 x Per Day/30 Days ary Discharge Instructions: Apply Hydrofera Blue Ready to wound bed as directed Secondary Dressing: ABD Pad 5x9 (in/in) (Generic) 1 x Per Day/30 Days Discharge Instructions:  Cover with ABD pad Secured With: Medipore T - 31M Medipore H Soft Cloth Surgical T ape ape, 2x2 (in/yd) 1 x Per Day/30 Days Secured With: [employer] Sterile or Non-Sterile 6-ply 4.5x4 (yd/yd) (Generic) 1 x Per Day/30 Days Discharge Instructions: Apply Kerlix as directed Electronic Signature(s) Signed: 07/11/2022 11:59:33 AM By: Robert Corwin DO Entered By: Robert Levine on 07/11/2022 11:42:30 -------------------------------------------------------------------------------- Problem List Details Patient Name: Date of Service: Robert Levine, Robert Levine 07/11/2022 11:00 A M Medical Record Number: [id number] Patient Account Number: [id number] Date of Birth/Sex: Treating Levine: 09-Apr-1955 (67 y.o. Robert Levine Primary Care Provider: Ilsa Levine Other Clinician: Betha Levine Referring Provider: Treating Provider/Extender: Robert Levine in Treatment: 2 Active Problems ICD-10 Encounter Code Description Active Date MDM Diagnosis E11.621 Type 2 diabetes mellitus with foot ulcer 06/27/2022 No Yes Robert Levine, Robert Levine ([id number]) [id number].pdf Page 4 of 7 L97.512 Non-pressure chronic ulcer of other part of right foot with fat layer exposed 06/27/2022 No Yes Z89.411 Acquired absence of right great toe 06/27/2022 No Yes N18.30 Chronic kidney disease, stage 3 unspecified 06/27/2022 No Yes I10 Essential (primary) hypertension 06/27/2022 No Yes Inactive Problems Resolved Problems Electronic Signature(s) Signed: 07/11/2022 11:59:33 AM By: Robert Corwin DO Entered By: Robert Levine on 07/11/2022 11:34:14 -------------------------------------------------------------------------------- Progress Note Details Patient Name: Date of Service: Robert Levine, Robert Levine 07/11/2022 11:00 A M Medical Record Number: [id number] Patient Account Number: [id number] Date of Birth/Sex: Treating Levine: 1955-05-04 (67 y.o. Robert Levine Primary Care Provider: Ilsa Levine Other  Clinician: Betha Levine Referring Provider: Treating Provider/Extender: Robert Levine in Treatment: 2 Subjective Chief Complaint Information obtained from Patient 06/27/2022; Right great toe ulcer at amputation site History of Present Illness (HPI) 01-15-2022 patient presents today as a referral from Dr. Rosetta Posner who is a local podiatrist due to a foot wound that is not getting better. The patient unfortunately has had this going on for quite some time he is not necessarily the best historian which makes it a little bit difficult but nonetheless I did review notes  as well together where things stand what is going on right now has been using Vaseline over the area according to what he tells me he has had debridements with Dr. Excell SeltzerBaker intermittently. With that being said the patient has not really been using his offloading shoe as much she does have diabetic shoes and he tells me that is primarily what has been wearing. 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 He also has high blood pressure. 01-25-2022 upon evaluation today patient's wound actually showing signs of improvement though there is still little bit of callus and some biofilm and slough buildup on the surface of the wound. I Minna perform sharp debridement to clear this away today. He tells me has been using the shoe a lot of the time though sometimes when he is at the assisted living facility he just walks around with a sock not using the shoe. I explained that he needs to make sure to always have the shoe on when he is up and moving around as can help this to heal most effectively. 02-01-2022 upon evaluation today patient appears to be doing well currently in regard to his wound from the standpoint of this not getting worse it does not look to be  infected. With that being said unfortunately he does have an open wound that is going to require some sharp debridement today and again I am concerned that if we do not get this closed he is going to end up with this worsening in general. He has not really been wearing the postop shoe with front offloading unfortunately which means that he is not really getting much better. I think that a total contact cast is probably going to be the method in which to try to get this healed as quickly as possible. Again the sooner we get healed lessen the chance that this will develop into a more significant ulceration requiring any type of surgery. We especially would avoid any chance for amputation. 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 Here for application of TCC #1 11/9; obligatory first total contact cast change. The patient had no particular problems. TCC reapplied 02-15-2022 upon evaluation today patient appears to be doing well currently in regard to his wound. There is some callus however buildup that is going to need to be cleared away. Fortunately there does not appear to be any signs of infection this is actually his third cast application today although the first 2 were last week and this is already quite a bit smaller. 02-19-2022 upon evaluation today patient's wound actually is showing signs of doing well with the cast he is actually making good progress. Fortunately I do not see any evidence of infection locally or systemically at this time which is great news overall I think he is on the right track. 02-26-2022 upon evaluation today patient actually appears to be completely healed which is great news. Fortunately I see no evidence of active infection at this time locally or systemically which is great news as well. Robert CedarBURNHAM, Jimy ([id number]) [id number] Page 5 of 7 06/27/2022 Mr. 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 He has been treated in our clinic for this issue previously with a total contact cast. He has a podiatrist. He resides in a nursing facility. They have recommended he come in for his wound care. He is fairly unaware of the wound and its condition. 4/10; patient presents for follow-up. He has been using Hydrofera Blue to the wound bed along with his front offloading shoe on the right foot. He has taken his oral antibiotics without issues. Objective Constitutional Vitals Time Taken: 11:14 AM, Height: 77 in, Weight: 257 lbs, BMI: 30.5, Temperature: 98.0 F, Pulse: 71 bpm, Respiratory Rate: 16 breaths/min, Blood Pressure: 149/79 mmHg. General Notes: Previous right great toe amputation. T the plantar aspect of the previous amputation site there is an open wound with granualtion tissue, non o viable tissue and significant callus. No signs of surrounding infection. Integumentary (Hair, Skin) Wound #2 status is Open. Original cause of wound was Surgical Injury. The date acquired was: 06/03/2022. The wound has been in treatment 2 Levine. The wound is located on the Right Amputation Site - T The wound measures 0.5cm length x 0.6cm width x 0.1cm depth; 0.236cm^2 area and 0.024cm^3 volume. There oe. is a medium amount of serosanguineous drainage noted. There is medium (34-66%) red, pink granulation within the wound bed. There is a small (1-33%) amount of necrotic tissue within the wound bed including Eschar. 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 I debrided nonviable tissue. I recommended continuing the course with Hydrofera Blue and aggressive offloading with right offloading shoe. No need to continue oral antibiotics. No signs of infection today and wound appears well- healing. Follow-up in 1 week. Procedures Wound #2 Pre-procedure diagnosis of Wound #2 is a Diabetic Wound/Ulcer of the Lower Extremity located on the Right Amputation Site - T .Severity of Tissue Pre oe Debridement is: Fat layer exposed. There was a Excisional Skin/Subcutaneous Tissue Debridement with a total area of 30 sq cm performed by Robert Corwin, MD. With the following instrument(s): Curette to remove Viable and Non-Viable tissue/material. Material removed includes Callus, Subcutaneous Tissue, and Slough. A time out was conducted at 11:25, prior to the start of the procedure. A Minimum amount of bleeding was controlled with Pressure. The procedure was tolerated well. Post Debridement Measurements: 0.5cm length x 0.6cm width x 0.1cm depth; 0.024cm^3 volume. Character of Wound/Ulcer Post Debridement is stable. Severity of Tissue Post Debridement is: Fat layer exposed. Post procedure Diagnosis Wound #2: Same as Pre-Procedure Plan Follow-up Appointments: Return Appointment in 2 Levine. Bathing/ Shower/ Hygiene: Wash wounds with antibacterial soap and water. Off-Loading: Other: - Front off loader WOUND #2: - Amputation Site - T oe Wound Laterality: Right Cleanser: Soap and Water 1 x Per Day/30 Days Discharge Instructions: Gently cleanse wound with antibacterial soap, rinse and pat dry prior to dressing wounds Prim Dressing: Hydrofera Blue Ready Transfer Foam, 2.5x2.5 (in/in) (Dispense As Written) 1 x Per Day/30 Days ary Discharge Instructions: Apply Hydrofera Blue Ready to wound bed as directed GRAESON, NOURI ([id number]) [id number]  Page 6 of 7 Secondary Dressing: ABD Pad 5x9 (in/in) (Generic) 1 x Per Day/30 Days Discharge Instructions: Cover with ABD pad Secured With: Medipore T - 62M Medipore H Soft Cloth Surgical T ape ape, 2x2 (in/yd)  1 x Per Day/30 Days Secured With: Kerlix Roll Sterile or Non-Sterile 6-ply 4.5x4 (yd/yd) (Generic) 1 x Per Day/30 Days Discharge Instructions: Apply Kerlix as directed 1. In office sharp debridement 2. Hydrofera Blue 3. Aggressive offloadingoofront offloading shoe to the right foot 4. Follow-up in 2 Levine (Patient cannot come next week) Electronic Signature(s) Signed: 07/11/2022 11:59:33 AM By: Robert Corwin DO Entered By: Robert Levine on 07/11/2022 11:42:07 -------------------------------------------------------------------------------- ROS/PFSH Details Patient Name: Date of Service: Robert Levine, Robert Levine 07/11/2022 11:00 A M Medical Record Number: [id number] Patient Account Number: [id number] Date of Birth/Sex: Treating Levine: [birth year] (67 y.o. Robert Levine Primary Care Provider: Ilsa Levine Other Clinician: Betha Levine Referring Provider: Treating Provider/Extender: Robert Levine in Treatment: 2 Information Obtained From Patient Cardiovascular Medical History: Positive for: Hypertension Endocrine Medical History: Positive for: Type II Diabetes Time with diabetes: 5 years Treated with: Insulin Genitourinary Medical History: Negative for: End Stage Renal Disease Past Medical History Notes: CKD3 Integumentary (Skin) Medical History: Negative for: History of Burn; History of pressure wounds Immunizations Pneumococcal Vaccine: Received Pneumococcal Vaccination: Yes Received Pneumococcal Vaccination On or After 60th Birthday: Yes Implantable Devices None Family and Social History Never smoker; Marital Status - Single; Alcohol Use: Never; Drug Use: No History; Caffeine Use: Daily Electronic Signature(s) Signed: 07/11/2022 11:59:33 AM  By: Robert Corwin DO Signed: 07/11/2022 5:50:22 PM By: Robert Levine, [name], Levine, CWS, Robert Levine, [name] Entered By: Robert Levine on 07/11/2022 11:42:42 Robert Levine ([id number]) [id number] Page 7 of 7 -------------------------------------------------------------------------------- SuperBill Details Patient Name: Date of Service: Robert Levine [facility] 07/11/2022 Medical Record Number: [id number] Patient Account Number: [id number] Date of Birth/Sex: Treating Levine: 12-Jul-1955 (67 y.o. Robert Levine, Robert Levine Primary Care Provider: Ilsa Levine Other Clinician: Betha Levine Referring Provider: Treating Provider/Extender: Robert Levine in Treatment: 2 Diagnosis Coding ICD-10 Codes Code Description [phone] Type 2 diabetes mellitus with foot ulcer L97.512 Non-pressure chronic ulcer of other part of right foot with fat layer exposed Z89.411 Acquired absence of right great toe N18.30 Chronic kidney disease, stage 3 unspecified I10 Essential (primary) hypertension Facility Procedures : CPT4 Code: [id number] 1 Description: 1042 - DEB SUBQ TISSUE 20 SQ CM/< ICD-10 Diagnosis Description L97.512 Non-pressure chronic ulcer of other part of right foot with fat layer exposed E11.621 Type 2 diabetes mellitus with foot ulcer Modifier: Quantity: 1 : CPT4 Code: [id number] 1 Description: 1045 - DEB SUBQ TISS EA ADDL 20CM ICD-10 Diagnosis Description E11.621 Type 2 diabetes mellitus with foot ulcer L97.512 Non-pressure chronic ulcer of other part of right foot with fat layer exposed Modifier: Quantity: 1 Physician Procedures : CPT4 Code Description Modifier [id number] 11042 - WC PHYS SUBQ TISS 20 SQ CM ICD-10 Diagnosis Description L97.512 Non-pressure chronic ulcer of other part of right foot with fat layer exposed E11.621 Type 2 diabetes mellitus with foot ulcer Quantity: 1 : [id number] 11045 - WC PHYS SUBQ TISS EA ADDL 20 CM ICD-10 Diagnosis Description E11.621 Type 2 diabetes  mellitus with foot ulcer L97.512 Non-pressure chronic ulcer of other part of right foot with fat layer exposed Quantity: 1 Electronic Signature(s) Signed: 07/11/2022 11:59:33 AM By: Robert Corwin DO Entered By: Robert Levine on 07/11/2022 11:42:21

## 2022-07-25 ENCOUNTER — Encounter (HOSPITAL_BASED_OUTPATIENT_CLINIC_OR_DEPARTMENT_OTHER): Payer: Medicare Other | Admitting: Internal Medicine

## 2022-07-25 DIAGNOSIS — L97512 Non-pressure chronic ulcer of other part of right foot with fat layer exposed: Secondary | ICD-10-CM | POA: Diagnosis not present

## 2022-07-25 DIAGNOSIS — E11621 Type 2 diabetes mellitus with foot ulcer: Secondary | ICD-10-CM

## 2022-07-27 NOTE — Progress Notes (Signed)
KELLIN, BARTLING ([id number]) [id number] Page 1 of 7 Visit Report for 07/25/2022 Chief Complaint Document Details Patient Name: Date of Service: Robert Levine 07/25/2022 10:15 A M Medical Record Number: [id number] Patient Account Number: [id number] Date of Birth/Sex: Treating RN: 1956-02-05 (66 y.o. [name]) Yevonne Pax Primary Care Provider: Ilsa Iha Other Clinician: Referring Provider: Treating Provider/Extender: Ammie Ferrier in Treatment: 4 Information Obtained from: Patient Chief Complaint 06/27/2022; Right great toe ulcer at amputation site Electronic Signature(s) Signed: 07/25/2022 12:31:39 PM By: Geralyn Corwin DO Entered By: Geralyn Corwin on 07/25/2022 10:33:42 -------------------------------------------------------------------------------- Debridement Details Patient Name: Date of Service: Robert Levine, [name] [facility] 07/25/2022 10:15 A M Medical Record Number: [id number] Patient Account Number: [id number] Date of Birth/Sex: Treating RN: 10/13/1955 (66 y.o. [name]) Yevonne Pax Primary Care Provider: Ilsa Iha Other Clinician: Referring Provider: Treating Provider/Extender: Ammie Ferrier in Treatment: 4 Debridement Performed for Assessment: Wound #2 Right Amputation Site - Toe Performed By: Physician Geralyn Corwin, MD Debridement Type: Debridement Severity of Tissue Pre Debridement: Fat layer exposed Level of Consciousness (Pre-procedure): Awake and Alert Pre-procedure Verification/Time Out Yes - 10:30 Taken: Start Time: 10:30 Percent of Wound Bed Debrided: 100% T Area Debrided (cm): otal 0.35 Tissue and other material debrided: Viable, Non-Viable, Callus, Slough, Subcutaneous, Slough Level: Skin/Subcutaneous Tissue Debridement Description: Excisional Instrument: Curette Bleeding: Minimum End Time: 10:32 Procedural Pain: 0 Post Procedural Pain: 0 Response to Treatment: Procedure was  tolerated well Level of Consciousness (Post- Awake and Alert procedure): Post Debridement Measurements of Total Wound Length: (cm) 0.3 Width: (cm) 1.5 Depth: (cm) 0.3 Volume: (cm) 0.106 Character of Wound/Ulcer Post Debridement: Improved Severity of Tissue Post Debridement: Fat layer exposed Post Procedure Diagnosis Same as Pre-procedure Electronic Signature(s) Signed: 07/25/2022 12:31:39 PM By: Geralyn Corwin DO Signed: 07/26/2022 3:34:54 PM By: Yevonne Pax RN Entered By: Yevonne Pax on 07/25/2022 10:31:49 Robert Levine ([id number]) [id number] Page 2 of 7 -------------------------------------------------------------------------------- HPI Details Patient Name: Date of Service: Robert Levine [facility] 07/25/2022 10:15 A M Medical Record Number: [id number] Patient Account Number: [id number] Date of Birth/Sex: Treating RN: [birth year] (66 y.o. [name]) Yevonne Pax Primary Care Provider: Ilsa Iha Other Clinician: Referring Provider: Treating Provider/Extender: Ammie Ferrier in Treatment: 4 History of Present Illness HPI Description: 01-15-2022 patient presents today as a referral from Dr. Rosetta Posner who is a local podiatrist due to a foot wound that is not getting better. The patient unfortunately has had this going on for quite some time he is not necessarily the best historian which makes it a little bit difficult but nonetheless I did review notes as well together where things stand what is going on right now has been using Vaseline over the area according to what he tells me he has had debridements with Dr. Excell Seltzer intermittently. With that being said the patient has not really been using his offloading shoe as much she does have diabetic shoes and he tells me that is primarily what has been wearing. Patient does have a history of diabetes mellitus type 2, peripheral neuropathy due to diabetes, right great toe amputation which has  been previous and proceeded the wound that is currently present. He also has high blood pressure. 01-25-2022 upon evaluation today patient's wound actually showing signs of improvement though there is still little bit of callus and some biofilm and slough buildup on the surface of the wound. I Minna perform sharp debridement to clear this away today. He tells me has been using the shoe a lot of  the time though sometimes when he is at the assisted living facility he just walks around with a sock not using the shoe. I explained that he needs to make sure to always have the shoe on when he is up and moving around as can help this to heal most effectively. 02-01-2022 upon evaluation today patient appears to be doing well currently in regard to his wound from the standpoint of this not getting worse it does not look to be infected. With that being said unfortunately he does have an open wound that is going to require some sharp debridement today and again I am concerned that if we do not get this closed he is going to end up with this worsening in general. He has not really been wearing the postop shoe with front offloading unfortunately which means that he is not really getting much better. I think that a total contact cast is probably going to be the method in which to try to get this healed as quickly as possible. Again the sooner we get healed lessen the chance that this will develop into a more significant ulceration requiring any type of surgery. We especially would avoid any chance for amputation. 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 Here for application of TCC #1 11/9; obligatory first total contact cast change. The patient had no particular problems. TCC reapplied 02-15-2022 upon evaluation today patient appears to be doing well currently in regard to his wound. There is some callus however buildup that is going to need to be cleared away. Fortunately there does not appear to be any  signs of infection this is actually his third cast application today although the first 2 were last week and this is already quite a bit smaller. 02-19-2022 upon evaluation today patient's wound actually is showing signs of doing well with the cast he is actually making good progress. Fortunately I do not see any evidence of infection locally or systemically at this time which is great news overall I think he is on the right track. 02-26-2022 upon evaluation today patient actually appears to be completely healed which is great news. Fortunately I see no evidence of active infection at this time locally or systemically which is great news as well. 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 He has been treated in our clinic for this issue previously with a total contact cast. He has a podiatrist. He resides in a nursing facility. They have recommended he come in for his wound care. He is fairly unaware of the wound and its condition. 4/10; patient presents for follow-up. He has been using Hydrofera Blue to the wound bed along with his front offloading shoe on the right foot. He has taken his oral antibiotics without issues. 4/24; patient presents for follow-up. He has been using Hydrofera Blue to the wound bed. He has stopped using his front offloading shoe because he thinks it is not helping. He has no issues or complaints today. He would like to follow-up every 2 weeks. Electronic Signature(s) Signed: 07/25/2022 12:31:39 PM By: Geralyn Corwin DO Entered By: Geralyn Corwin on 07/25/2022 10:34:15 -------------------------------------------------------------------------------- Physical Exam Details Patient Name: Date  of Service: Robert Levine, [name] [facility] 07/25/2022 10:15 A M Medical  Record Number: [id number] Patient Account Number: [id number] Date of Birth/Sex: Treating RN: November 25, 1955 (66 y.o. [name]) Yevonne Pax Primary Care Provider: Ilsa Iha Other Clinician: Referring Provider: Treating Provider/Extender: Ammie Ferrier in Treatment: 4 Constitutional . Cardiovascular Robert, Levine ([id number]) [id number] Page 3 of 7 . Psychiatric . Notes Previous right great toe amputation. T the plantar aspect of the previous amputation site there is an open wound with granualtion tissue, non viable tissue and o callus. No signs of surrounding infection. Electronic Signature(s) Signed: 07/25/2022 12:31:39 PM By: Geralyn Corwin DO Entered By: Geralyn Corwin on 07/25/2022 10:34:45 -------------------------------------------------------------------------------- Physician Orders Details Patient Name: Date of Service: Robert Levine, [name] [facility] 07/25/2022 10:15 A M Medical Record Number: [id number] Patient Account Number: [id number] Date of Birth/Sex: Treating RN: March 28, 1956 (66 y.o. [name] Primary Care Provider: Ilsa Iha Other Clinician: Referring Provider: Treating Provider/Extender: Ammie Ferrier in Treatment: 4 Verbal / Phone Orders: No Diagnosis Coding Follow-up Appointments Return Appointment in 2 weeks. Home Health [facility] for wound care. May utilize formulary equivalent dressing for wound treatment orders unless otherwise specified. Home Health Nurse may visit PRN to address patients wound care needs. Robert Levine [phone] Bathing/ Shower/ Hygiene Wash wounds with antibacterial soap and water. Off-Loading Other: - Front off loader Wound Treatment Wound #2 - Amputation Site - Toe Wound Laterality: Right Cleanser: Soap and Water 1 x Per Day/30 Days Discharge Instructions: Gently cleanse wound with  antibacterial soap, rinse and pat dry prior to dressing wounds Prim Dressing: Hydrofera Blue Ready Transfer Foam, 2.5x2.5 (in/in) (Dispense As Written) 1 x Per Day/30 Days ary Discharge Instructions: Apply Hydrofera Blue Ready to wound bed as directed Secured With: Medipore T - 56M Medipore H Soft Cloth Surgical T ape ape, 2x2 (in/yd) 1 x Per Day/30 Days Electronic Signature(s) Signed: 07/25/2022 12:31:39 PM By: Geralyn Corwin DO Signed: 07/26/2022 3:34:54 PM By: Yevonne Pax RN Entered By: Yevonne Pax on 07/25/2022 10:38:06 -------------------------------------------------------------------------------- Problem List Details Patient Name: Date of Service: Robert Levine, [name] [facility] 07/25/2022 10:15 A M Medical Record Number: [id number] Patient Account Number: [id number] Date of Birth/Sex: Treating RN: 05/03/1955 (66 y.o. [name] Primary Care Provider: Ilsa Iha Other Clinician: Referring Provider: Treating Provider/Extender: Ammie Ferrier in Treatment: 4 Active Problems ICD-10 Encounter Robert Levine ([id number]) [id number] Page 4 of 7 Encounter Code Description Active Date MDM Diagnosis E11.621 Type 2 diabetes mellitus with foot ulcer 06/27/2022 No Yes L97.512 Non-pressure chronic ulcer of other part of right foot with fat layer exposed 06/27/2022 No Yes Z89.411 Acquired absence of right great toe 06/27/2022 No Yes N18.30 Chronic kidney disease, stage 3 unspecified 06/27/2022 No Yes I10 Essential (primary) hypertension 06/27/2022 No Yes Inactive Problems Resolved Problems Electronic Signature(s) Signed: 07/25/2022 12:31:39 PM By: Geralyn Corwin DO Entered By: Geralyn Corwin on 07/25/2022 10:33:38 -------------------------------------------------------------------------------- Progress Note Details Patient Name: Date of Service: Robert Levine, [name] Robert [facility] 07/25/2022 10:15 A M Medical Record Number: [id number] Patient Account Number:  [id number] Date of Birth/Sex: Treating RN: 1956-02-26 (66 y.o. [name]) Yevonne Pax Primary Care Provider: Ilsa Iha Other Clinician: Referring Provider: Treating Provider/Extender: Ammie Ferrier in Treatment: 4 Subjective Chief Complaint Information obtained from Patient 06/27/2022; Right great toe ulcer at amputation site History of Present Illness (HPI) 01-15-2022 patient presents today as a referral from Dr. Rosetta Posner who is a local podiatrist due to a foot wound that is not getting better. The patient unfortunately has had this going on for quite some time he  is not necessarily the best historian which makes it a little bit difficult but nonetheless I did review notes as well together where things stand what is going on right now has been using Vaseline over the area according to what he tells me he has had debridements with Dr. Excell Seltzer intermittently. With that being said the patient has not really been using his offloading shoe as much she does have diabetic shoes and he tells me that is primarily what has been wearing. 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 He also has high blood pressure. 01-25-2022 upon evaluation today patient's wound actually showing signs of improvement though there is still little bit of callus and some biofilm and slough buildup on the surface of the wound. I Minna perform sharp debridement to clear this away today. He tells me has been using the shoe a lot of the time though sometimes when he is at the assisted living facility he just walks around with a sock not using the shoe. I explained that he needs to make sure to always have the shoe on when he is up and moving around as can help this to heal most effectively. 02-01-2022 upon evaluation today patient appears to be doing well  currently in regard to his wound from the standpoint of this not getting worse it does not look to be infected. With that being said unfortunately he does have an open wound that is going to require some sharp debridement today and again I am concerned that if we do not get this closed he is going to end up with this worsening in general. He has not really been wearing the postop shoe with front offloading unfortunately which means that he is not really getting much better. I think that a total contact cast is probably going to be the method in which to try to get this healed as quickly as possible. Again the sooner we get healed lessen the chance that this will develop into a more significant ulceration requiring any type of surgery. We especially would avoid any chance for amputation. 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 Here for application of TCC #1 11/9; obligatory first total contact cast change. The patient had no particular problems. TCC reapplied 02-15-2022 upon evaluation today patient appears to be doing well currently in regard to his wound. There is some callus however buildup that is going to need to be cleared away. Fortunately there does not appear to be any signs of infection this is actually his third cast application today although the first 2 were last week and this is already quite a bit smaller. 02-19-2022 upon evaluation today patient's wound actually is showing signs of doing well with the cast he is actually making good progress. Fortunately I do Robert Levine, Robert Levine ([id number]) [id number] Page 5 of 7 not see any evidence of infection locally or systemically at this time which is great news overall I think he is on the right track. 02-26-2022 upon evaluation today patient actually appears to be completely healed which is great news. Fortunately I see no evidence of active infection at this time locally or systemically which is great news  as well. 06/27/2022 Robert Levine is a 67 year old male with a past medical history of uncontrolled insulin-dependent type 2 diabetes, previous amputation to the right great toe and stage III chronic  kidney disease that presents the clinic for a 1 month history of nonhealing ulcer to the previous right great toe amputation site. He has been treated in our clinic for this issue previously with a total contact cast. He has a podiatrist. He resides in a nursing facility. They have recommended he come in for his wound care. He is fairly unaware of the wound and its condition. 4/10; patient presents for follow-up. He has been using Hydrofera Blue to the wound bed along with his front offloading shoe on the right foot. He has taken his oral antibiotics without issues. 4/24; patient presents for follow-up. He has been using Hydrofera Blue to the wound bed. He has stopped using his front offloading shoe because he thinks it is not helping. He has no issues or complaints today. He would like to follow-up every 2 weeks. Objective Constitutional Vitals Time Taken: 10:17 AM, Height: 77 in, Weight: 257 lbs, BMI: 30.5, Temperature: 97.8 F, Pulse: 71 bpm, Respiratory Rate: 18 breaths/min, Blood Pressure: 145/81 mmHg. General Notes: Previous right great toe amputation. T the plantar aspect of the previous amputation site there is an open wound with granualtion tissue, non o viable tissue and callus. No signs of surrounding infection. Integumentary (Hair, Skin) Wound #2 status is Open. Original cause of wound was Surgical Injury. The date acquired was: 06/03/2022. The wound has been in treatment 4 weeks. The wound is located on the Right Amputation Site - T The wound measures 0.2cm length x 1cm width x 0.1cm depth; 0.157cm^2 area and 0.016cm^3 volume. There is oe. Fat Layer (Subcutaneous Tissue) exposed. There is no tunneling or undermining noted. There is a medium amount of serosanguineous drainage noted.  There is large (67-100%) red, pink granulation within the wound bed. There is no necrotic tissue within the wound bed. 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 I debrided nonviable tissue. Measurements are slightly larger postdebridement. At this time I recommended continuing Hydrofera Blue. I advised that the front offloading shoe is helping relieve pressure and recommended he continue to use this to help with his wound healing. He expressed understanding. Follow-up in 2 weeks. Procedures Wound #2 Pre-procedure diagnosis of Wound #2 is a Diabetic Wound/Ulcer of the Lower Extremity located on the Right Amputation Site - T .Severity of Tissue Pre oe Debridement is: Fat layer exposed. There was a Excisional Skin/Subcutaneous Tissue Debridement with a total area of 0.35 sq cm performed by Geralyn Corwin, MD. With the following instrument(s): Curette to remove Viable and Non-Viable tissue/material. Material removed includes Callus, Subcutaneous Tissue, and Slough. No specimens were taken. A time out was conducted at 10:30, prior to the start of the procedure. A Minimum amount of bleeding was controlled with N/A. The procedure was tolerated well with a pain level of 0 throughout and a pain level of 0 following the procedure. Post Debridement Measurements: 0.3cm length x 1.5cm width x 0.3cm depth; 0.106cm^3 volume. Character of Wound/Ulcer Post Debridement is improved. Severity of Tissue Post Debridement is: Fat layer exposed. Post procedure Diagnosis Wound #2: Same as Pre-Procedure Plan Follow-up Appointments: Return Appointment in 2 weeks. JAIMES, ECKERT ([id number])  [id number] Page 6 of 7 Bathing/ Shower/ Hygiene: Wash wounds with antibacterial soap and water. Off-Loading: Other: - Front off  loader WOUND #2: - Amputation Site - T oe Wound Laterality: Right Cleanser: Soap and Water 1 x Per Day/30 Days Discharge Instructions: Gently cleanse wound with antibacterial soap, rinse and pat dry prior to dressing wounds Prim Dressing: Hydrofera Blue Ready Transfer Foam, 2.5x2.5 (in/in) (Dispense As Written) 1 x Per Day/30 Days ary Discharge Instructions: Apply Hydrofera Blue Ready to wound bed as directed Secured With: Medipore T - 37M Medipore H Soft Cloth Surgical T ape ape, 2x2 (in/yd) 1 x Per Day/30 Days 1. In office sharp debridement 2. Hydrofera Blue 3. Front offloading shoe to the right 4. Follow-up in 2 weeks Electronic Signature(s) Signed: 07/25/2022 12:31:39 PM By: Geralyn Corwin DO Entered By: Geralyn Corwin on 07/25/2022 10:36:39 -------------------------------------------------------------------------------- ROS/PFSH Details Patient Name: Date of Service: Robert Levine, [name] Robert Levine 07/25/2022 10:15 A M Medical Record Number: [id number] Patient Account Number: [id number] Date of Birth/Sex: Treating RN: Jul 29, 1955 (66 y.o. [name]) Yevonne Pax Primary Care Provider: Ilsa Iha Other Clinician: Referring Provider: Treating Provider/Extender: Ammie Ferrier in Treatment: 4 Information Obtained From Patient Cardiovascular Medical History: Positive for: Hypertension Endocrine Medical History: Positive for: Type II Diabetes Time with diabetes: 5 years Treated with: Insulin Genitourinary Medical History: Negative for: End Stage Renal Disease Past Medical History Notes: CKD3 Integumentary (Skin) Medical History: Negative for: History of Burn; History of pressure wounds Immunizations Pneumococcal Vaccine: Received Pneumococcal Vaccination: Yes Received Pneumococcal Vaccination On or After  60th Birthday: Yes Implantable Devices None Family and Social History Never smoker; Marital Status - Single; Alcohol Use: Never; Drug Use: No History; Caffeine Use: Daily Electronic Signature(s) Signed: 07/25/2022 12:31:39 PM By: Geralyn Corwin DO Signed: 07/26/2022 3:34:54 PM By: Yevonne Pax RN Gomez Cleverly, Greggory Stallion ([id number]) By: Yevonne Pax RN [id number] Page 7 of 7 Signed: 07/26/2022 3:34:54 PM Entered By: Geralyn Corwin on 07/25/2022 10:37:14 -------------------------------------------------------------------------------- SuperBill Details Patient Name: Date of Service: Robert Levine [facility] 07/25/2022 Medical Record Number: [id number] Patient Account Number: [id number] Date of Birth/Sex: Treating RN: [birth year] (66 y.o. [name]) Yevonne Pax Primary Care Provider: Ilsa Iha Other Clinician: Referring Provider: Treating Provider/Extender: Ammie Ferrier in Treatment: 4 Diagnosis Coding ICD-10 Codes Code Description [phone] Type 2 diabetes mellitus with foot ulcer L97.512 Non-pressure chronic ulcer of other part of right foot with fat layer exposed Z89.411 Acquired absence of right great toe N18.30 Chronic kidney disease, stage 3 unspecified I10 Essential (primary) hypertension Facility Procedures : CPT4 Code: [id number] Description: 11042 - DEB SUBQ TISSUE 20 SQ CM/< ICD-10 Diagnosis Description L97.512 Non-pressure chronic ulcer of other part of right foot with fat layer exposed E11.621 Type 2 diabetes mellitus with foot ulcer Modifier: Quantity: 1 Physician Procedures : CPT4 Code Description Modifier [id number] 11042 - WC PHYS SUBQ TISS 20 SQ CM ICD-10 Diagnosis Description L97.512 Non-pressure chronic ulcer of other part of right foot with fat layer exposed E11.621 Type 2 diabetes mellitus with foot ulcer Quantity: 1 Electronic Signature(s) Signed: 07/25/2022 12:31:39 PM By: Geralyn Corwin DO Entered By: Geralyn Corwin on  07/25/2022 10:36:50

## 2022-07-27 NOTE — Progress Notes (Signed)
BENAIAH, BEHAN ([id number]) [id number] Page 1 of 7 Visit Report for 07/25/2022 Arrival Information Details Patient Name: Date of Service: Robert Levine [facility] 07/25/2022 10:15 A M Medical Record Number: [id number] Patient Account Number: [id number] Date of Birth/Sex: Treating RN: 29-Jun-1955 (66 y.o. [name]) Yevonne Pax Primary Care Fathima Bartl: Ilsa Iha Other Clinician: Referring Nashid Pellum: Treating Romir Klimowicz/Extender: Ammie Ferrier in Treatment: 4 Visit Information History Since Last Visit Added or deleted any medications: No Patient Arrived: Ambulatory Any new allergies or adverse reactions: No Arrival Time: 10:12 Had a fall or experienced change in No Accompanied By: self activities of daily living that may affect Transfer Assistance: None risk of falls: Patient Identification Verified: Yes Signs or symptoms of abuse/neglect since last visito No Secondary Verification Process Completed: Yes Hospitalized since last visit: No Patient Requires Transmission-Based Precautions: No Implantable device outside of the clinic excluding No Patient Has Alerts: Yes cellular tissue based products placed in the center Patient Alerts: DM II since last visit: Has Dressing in Place as Prescribed: Yes Pain Present Now: No Electronic Signature(s) Signed: 07/26/2022 3:34:54 PM By: Yevonne Pax RN Entered By: Yevonne Pax on 07/25/2022 10:17:13 -------------------------------------------------------------------------------- Clinic Level of Care Assessment Details Patient Name: Date of Service: Robert Levine [facility] 07/25/2022 10:15 A M Medical Record Number: [id number] Patient Account Number: [id number] Date of Birth/Sex: Treating RN: 1955-05-19 (66 y.o. [name]) Yevonne Pax Primary Care Kymora Sciara: Ilsa Iha Other Clinician: Referring Carrissa Taitano: Treating Luan Urbani/Extender: Ammie Ferrier in Treatment: 4 Clinic Level of Care Assessment  Items TOOL 1 Quantity Score []  - 0 Use when EandM and Procedure is performed on INITIAL visit ASSESSMENTS - Nursing Assessment / Reassessment []  - 0 General Physical Exam (combine w/ comprehensive assessment (listed just below) when performed on new pt. evals) []  - 0 Comprehensive Assessment (HX, ROS, Risk Assessments, Wounds Hx, etc.) ASSESSMENTS - Wound and Skin Assessment / Reassessment []  - 0 Dermatologic / Skin Assessment (not related to wound area) ASSESSMENTS - Ostomy and/or Continence Assessment and Care []  - 0 Incontinence Assessment and Management []  - 0 Ostomy Care Assessment and Management (repouching, etc.) PROCESS - Coordination of Care []  - 0 Simple Patient / Family Education for ongoing care []  - 0 Complex (extensive) Patient / Family Education for ongoing care []  - 0 Staff obtains Chiropractor, Records, T Results / Process Orders est []  - 0 Staff telephones HHA, Nursing Homes / Clarify orders / etc []  - 0 Routine Transfer to another Facility (non-emergent condition) []  - 0 Routine Hospital Admission (non-emergent condition) []  - 0 New Admissions / Insurance Authorizations / Ordering NPWT Apligraf, etcDONAVON, Robert Levine ([id number]) [id number] Page 2 of 7 []  - 0 Emergency Hospital Admission (emergent condition) PROCESS - Special Needs []  - 0 Pediatric / Minor Patient Management []  - 0 Isolation Patient Management []  - 0 Hearing / Language / Visual special needs []  - 0 Assessment of Community assistance (transportation, D/C planning, etc.) []  - 0 Additional assistance / Altered mentation []  - 0 Support Surface(s) Assessment (bed, cushion, seat, etc.) INTERVENTIONS - Miscellaneous []  - 0 External ear exam []  - 0 Patient Transfer (multiple staff / Nurse, adult / Similar devices) []  - 0 Simple Staple / Suture removal (25 or less) []  - 0 Complex Staple / Suture removal (26 or more) []  - 0 Hypo/Hyperglycemic Management (do not  check if billed separately) []  - 0 Ankle / Brachial Index (ABI) - do not check if billed separately Has the patient been seen at the hospital  within the last three years: Yes Total Score: 0 Level Of Care: ____ Electronic Signature(s) Signed: 07/26/2022 3:34:54 PM By: Yevonne Pax RN Entered By: Yevonne Pax on 07/25/2022 10:44:04 -------------------------------------------------------------------------------- Encounter Discharge Information Details Patient Name: Date of Service: Robert Levine, Robert [facility] 07/25/2022 10:15 A M Medical Record Number: [id number] Patient Account Number: [id number] Date of Birth/Sex: Treating RN: 1956/02/11 (66 y.o. [name]) Yevonne Pax Primary Care Keidrick Murty: Ilsa Iha Other Clinician: Referring Raequan Vanschaick: Treating Claudene Gatliff/Extender: Ammie Ferrier in Treatment: 4 Encounter Discharge Information Items Post Procedure Vitals Discharge Condition: Stable Temperature (F): 97.8 Ambulatory Status: Cane Pulse (bpm): 71 Discharge Destination: Home Respiratory Rate (breaths/min): 18 Transportation: Private Auto Blood Pressure (mmHg): 145/81 Accompanied By: self Schedule Follow-up Appointment: Yes Clinical Summary of Care: Electronic Signature(s) Signed: 07/25/2022 10:45:15 AM By: Yevonne Pax RN Entered By: Yevonne Pax on 07/25/2022 10:45:15 -------------------------------------------------------------------------------- Lower Extremity Assessment Details Patient Name: Date of Service: Robert Levine [facility] 07/25/2022 10:15 A M Medical Record Number: [id number] Patient Account Number: [id number] Date of Birth/Sex: Treating RN: Jul 24, 1955 (66 y.o. [name] Primary Care Jaylnn Ullery: Ilsa Iha Other Clinician: Referring Daymein Nunnery: Treating Jah Alarid/Extender: Ammie Ferrier in Treatment: 4 Edema Assessment Assessed: Kyra Searles: No] [Right: No] Edema: [Left: N] [Right: o] B[LeftDONTEL, Robert Levine ([id number])] [Right:  [id number] Page 3 of 7] Vascular Assessment Pulses: Dorsalis Pedis Palpable: [Right:Yes] Electronic Signature(s) Signed: 07/26/2022 3:34:54 PM By: Yevonne Pax RN Entered By: Yevonne Pax on 07/25/2022 10:20:44 -------------------------------------------------------------------------------- Multi Wound Chart Details Patient Name: Date of Service: Robert Levine, Robert [facility] 07/25/2022 10:15 A M Medical Record Number: [id number] Patient Account Number: [id number] Date of Birth/Sex: Treating RN: 19-Oct-1955 (66 y.o. [name]) Yevonne Pax Primary Care Jakara Blatter: Ilsa Iha Other Clinician: Referring Traven Davids: Treating Jamiee Milholland/Extender: Ammie Ferrier in Treatment: 4 Vital Signs Height(in): 77 Pulse(bpm): 71 Weight(lbs): 257 Blood Pressure(mmHg): 145/81 Body Mass Index(BMI): 30.5 Temperature(F): 97.8 Respiratory Rate(breaths/min): 18 [2:Photos:] [N/A:N/A] Right Amputation Site - Toe N/A N/A Wound Location: Surgical Injury N/A N/A Wounding Event: Diabetic Wound/Ulcer of the Lower N/A N/A Primary Etiology: Extremity Hypertension, Type II Diabetes N/A N/A Comorbid History: 06/03/2022 N/A N/A Date Acquired: 4 N/A N/A Weeks of Treatment: Open N/A N/A Wound Status: No N/A N/A Wound Recurrence: 0.2x1x0.1 N/A N/A Measurements L x W x D (cm) 0.157 N/A N/A A (cm) : rea 0.016 N/A N/A Volume (cm) : 95.10% N/A N/A % Reduction in A rea: 99.00% N/A N/A % Reduction in Volume: Grade 2 N/A N/A Classification: Medium N/A N/A Exudate A mount: Serosanguineous N/A N/A Exudate Type: red, brown N/A N/A Exudate Color: Large (67-100%) N/A N/A Granulation A mount: Red, Pink N/A N/A Granulation Quality: None Present (0%) N/A N/A Necrotic A mount: Fat Layer (Subcutaneous Tissue): Yes N/A N/A Exposed Structures: Fascia: No Tendon: No Muscle: No Joint: No Bone: No None N/A N/A Epithelialization: Treatment Notes Electronic  Signature(s) Signed: 07/26/2022 3:34:54 PM By: Yevonne Pax RN Gomez Cleverly, Greggory Stallion ([id number]) [id number] Page 4 of 7 Entered By: Yevonne Pax on 07/25/2022 10:21:01 -------------------------------------------------------------------------------- Multi-Disciplinary Care Plan Details Patient Name: Date of Service: Robert Levine [facility] 07/25/2022 10:15 A M Medical Record Number: [id number] Patient Account Number: [id number] Date of Birth/Sex: Treating RN: 1955-10-25 (66 y.o. [name]) Yevonne Pax Primary Care Kursten Kruk: Ilsa Iha Other Clinician: Referring Sonda Coppens: Treating Jakylan Ron/Extender: Ammie Ferrier in Treatment: 4 Active Inactive Necrotic Tissue Nursing Diagnoses: Impaired tissue integrity related to necrotic/devitalized tissue Knowledge deficit related to management of necrotic/devitalized tissue Goals: Necrotic/devitalized tissue will be minimized in the wound  bed Date Initiated: 06/27/2022 Target Resolution Date: 07/28/2022 Goal Status: Active Patient/caregiver will verbalize understanding of reason and process for debridement of necrotic tissue Date Initiated: 06/27/2022 Target Resolution Date: 07/28/2022 Goal Status: Active Interventions: Assess patient pain level pre-, during and post procedure and prior to discharge Provide education on necrotic tissue and debridement process Treatment Activities: Excisional debridement : 06/27/2022 Notes: Soft Tissue Infection Nursing Diagnoses: Impaired tissue integrity Knowledge deficit related to disease process and management Knowledge deficit related to home infection control: handwashing, handling of soiled dressings, supply storage Potential for infection: soft tissue Goals: Patient will remain free of wound infection Date Initiated: 06/27/2022 Target Resolution Date: 07/28/2022 Goal Status: Active Patient/caregiver will verbalize understanding of or measures to prevent infection and  contamination in the home setting Date Initiated: 06/27/2022 Target Resolution Date: 07/28/2022 Goal Status: Active Patient's soft tissue infection will resolve Date Initiated: 06/27/2022 Target Resolution Date: 07/28/2022 Goal Status: Active Signs and symptoms of infection will be recognized early to allow for prompt treatment Date Initiated: 06/27/2022 Date Inactivated: 07/25/2022 Target Resolution Date: 07/07/2022 Goal Status: Met Interventions: Assess signs and symptoms of infection every visit Provide education on infection Treatment Activities: Systemic antibiotics : 06/27/2022 Notes: Wound/Skin Impairment Nursing Diagnoses: Impaired tissue integrity Knowledge deficit related to ulceration/compromised skin integrity Robert Levine, Robert Levine ([id number]) [id number] Page 5 of 7 Goals: Patient/caregiver will verbalize understanding of skin care regimen Date Initiated: 06/27/2022 Target Resolution Date: 07/28/2022 Goal Status: Active Ulcer/skin breakdown will have a volume reduction of 30% by week 4 Date Initiated: 06/27/2022 Target Resolution Date: 07/28/2022 Goal Status: Active Ulcer/skin breakdown will have a volume reduction of 50% by week 8 Date Initiated: 06/27/2022 Target Resolution Date: 08/27/2022 Goal Status: Active Ulcer/skin breakdown will have a volume reduction of 80% by week 12 Date Initiated: 06/27/2022 Target Resolution Date: 09/27/2022 Goal Status: Active Ulcer/skin breakdown will heal within 14 weeks Date Initiated: 06/27/2022 Target Resolution Date: 10/11/2022 Goal Status: Active Interventions: Assess patient/caregiver ability to obtain necessary supplies Assess patient/caregiver ability to perform ulcer/skin care regimen upon admission and as needed Assess ulceration(s) every visit Provide education on ulcer and skin care Treatment Activities: Referred to DME Messina Kosinski for dressing supplies : 06/27/2022 Skin care regimen initiated :  06/27/2022 Notes: Electronic Signature(s) Signed: 07/26/2022 3:34:54 PM By: Yevonne Pax RN Entered By: Yevonne Pax on 07/25/2022 10:23:03 -------------------------------------------------------------------------------- Pain Assessment Details Patient Name: Date of Service: Robert Levine [facility] 07/25/2022 10:15 A M Medical Record Number: [id number] Patient Account Number: [id number] Date of Birth/Sex: Treating RN: [birth year] (66 y.o. [name] Primary Care Kamren Heintzelman: Ilsa Iha Other Clinician: Referring Aviela Blundell: Treating Naoma Boxell/Extender: Ammie Ferrier in Treatment: 4 Active Problems Location of Pain Severity and Description of Pain Patient Has Paino No Site Locations Pain Management and Medication Current Pain Management: Electronic Signature(s) Robert Levine, Robert Levine ([id number]) [id number] Page 6 of 7 Signed: 07/26/2022 3:34:54 PM By: Yevonne Pax RN Entered By: Yevonne Pax on 07/25/2022 10:17:39 -------------------------------------------------------------------------------- Patient/Caregiver Education Details Patient Name: Date of Service: Robert Levine, Robert Levine 4/24/2024andnbsp10:15 A M Medical Record Number: [id number] Patient Account Number: [id number] Date of Birth/Gender: Treating RN: 06/18/1955 (66 y.o. [name]) Yevonne Pax Primary Care Physician: Ilsa Iha Other Clinician: Referring Physician: Treating Physician/Extender: Ammie Ferrier in Treatment: 4 Education Assessment Education Provided To: Patient Education Topics Provided Wound/Skin Impairment: Handouts: Caring for Your Ulcer Methods: Explain/Verbal Responses: State content correctly Electronic Signature(s) Signed: 07/26/2022 3:34:54 PM By: Yevonne Pax RN Entered By: Yevonne Pax on 07/25/2022 10:25:02 -------------------------------------------------------------------------------- Wound Assessment Details Patient Name:  Date of  Service: Robert Levine [facility] 07/25/2022 10:15 A M Medical Record Number: [id number] Patient Account Number: [id number] Date of Birth/Sex: Treating RN: November 17, 1955 (66 y.o. 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 Wound Status: Open Wounding Event: Surgical Injury Comorbid History: Hypertension, Type II Diabetes Date Acquired: 06/03/2022 Weeks Of Treatment: 4 Clustered Wound: No Photos Wound Measurements Length: (cm) 0.2 Width: (cm) 1 Depth: (cm) 0.1 Area: (cm) 0.157 Volume: (cm) 0.016 Robert Levine, Robert Levine ([id number]) Wound Description Classification: Grade 2 Exudate Amount: Medium Exudate Type: Serosanguineous Exudate Color: red, brown Foul Odor After Cleansing: No Slough/Fibrino No % Reduction in Area: 95.1% % Reduction in Volume: 99% Epithelialization: None Tunneling: No Undermining: No [id number] Page 7 of 7 Wound Bed Granulation Amount: Large (67-100%) Exposed Structure Granulation Quality: Red, Pink Fascia Exposed: No Necrotic Amount: None Present (0%) Fat Layer (Subcutaneous Tissue) Exposed: Yes Tendon Exposed: No Muscle Exposed: No Joint Exposed: No Bone Exposed: No Treatment Notes Wound #2 (Amputation Site - Toe) Wound Laterality: Right Cleanser Soap and Water Discharge Instruction: Gently cleanse wound with antibacterial soap, rinse and pat dry prior to dressing wounds Peri-Wound Care Topical Primary Dressing Hydrofera Blue Ready Transfer Foam, 2.5x2.5 (in/in) Discharge Instruction: Apply Hydrofera Blue Ready to wound bed as directed Secondary Dressing Secured With Medipore T - 55M Medipore H Soft Cloth Surgical T ape ape, 2x2 (in/yd) Compression Wrap Compression  Stockings Add-Ons Electronic Signature(s) Signed: 07/26/2022 3:34:54 PM By: Yevonne Pax RN Entered By: Yevonne Pax on 07/25/2022 10:20:22 -------------------------------------------------------------------------------- Vitals Details Patient Name: Date of Service: Robert Levine, Robert Levine 07/25/2022 10:15 A M Medical Record Number: [id number] Patient Account Number: [id number] Date of Birth/Sex: Treating RN: December 27, 1955 (66 y.o. [name]) Yevonne Pax Primary Care Bingham Millette: Ilsa Iha Other Clinician: Referring Akshath Mccarey: Treating Darwin Rothlisberger/Extender: Ammie Ferrier in Treatment: 4 Vital Signs Time Taken: 10:17 Temperature (F): 97.8 Height (in): 77 Pulse (bpm): 71 Weight (lbs): 257 Respiratory Rate (breaths/min): 18 Body Mass Index (BMI): 30.5 Blood Pressure (mmHg): 145/81 Reference Range: 80 - 120 mg / dl Electronic Signature(s) Signed: 07/26/2022 3:34:54 PM By: Yevonne Pax RN Entered By: Yevonne Pax on 07/25/2022 10:17:32

## 2022-08-08 ENCOUNTER — Encounter: Payer: Medicare Other | Attending: Internal Medicine | Admitting: Internal Medicine

## 2022-08-08 DIAGNOSIS — L97512 Non-pressure chronic ulcer of other part of right foot with fat layer exposed: Secondary | ICD-10-CM | POA: Diagnosis not present

## 2022-08-08 DIAGNOSIS — E11621 Type 2 diabetes mellitus with foot ulcer: Secondary | ICD-10-CM | POA: Insufficient documentation

## 2022-08-08 DIAGNOSIS — Z89411 Acquired absence of right great toe: Secondary | ICD-10-CM | POA: Insufficient documentation

## 2022-08-08 DIAGNOSIS — E1142 Type 2 diabetes mellitus with diabetic polyneuropathy: Secondary | ICD-10-CM | POA: Insufficient documentation

## 2022-08-08 DIAGNOSIS — Z794 Long term (current) use of insulin: Secondary | ICD-10-CM | POA: Diagnosis not present

## 2022-08-08 DIAGNOSIS — I129 Hypertensive chronic kidney disease with stage 1 through stage 4 chronic kidney disease, or unspecified chronic kidney disease: Secondary | ICD-10-CM | POA: Insufficient documentation

## 2022-08-08 DIAGNOSIS — E1122 Type 2 diabetes mellitus with diabetic chronic kidney disease: Secondary | ICD-10-CM | POA: Diagnosis not present

## 2022-08-08 DIAGNOSIS — N183 Chronic kidney disease, stage 3 unspecified: Secondary | ICD-10-CM | POA: Diagnosis not present

## 2022-08-10 NOTE — Progress Notes (Signed)
Robert Levine, Robert Levine ([id number]) [id number] Page 1 of 7 Visit Report for 08/08/2022 Arrival Information Details Patient Name: Date of Service: Robert Levine [facility] 08/08/2022 11:15 A M Medical Record Number: [id number] Patient Account Number: [id number] Date of Birth/Sex: Treating RN: 10/31/1955 (67 y.o. Robert Levine) Yevonne Pax Primary Care Talah Cookston: Ilsa Iha Other Clinician: Referring Amabel Stmarie: Treating Alyene Predmore/Extender: RO BSO Dorris Carnes, MICHA EL Janeth Rase in Treatment: 6 Visit Information History Since Last Visit Added or deleted any medications: No Patient Arrived: Ambulatory Any new allergies or adverse reactions: No Arrival Time: 11:03 Had a fall or experienced change in No Accompanied By: self activities of daily living that may affect Transfer Assistance: None risk of falls: Patient Identification Verified: Yes Signs or symptoms of abuse/neglect since last visito No Secondary Verification Process Completed: Yes Hospitalized since last visit: No Patient Requires Transmission-Based Precautions: No Implantable device outside of the clinic excluding No Patient Has Alerts: Yes cellular tissue based products placed in the center Patient Alerts: DM II since last visit: Has Dressing in Place as Prescribed: Yes Pain Present Now: No Electronic Signature(s) Signed: 08/10/2022 9:26:52 AM By: Yevonne Pax RN Entered By: Yevonne Pax on 08/08/2022 11:04:14 -------------------------------------------------------------------------------- Clinic Level of Care Assessment Details Patient Name: Date of Service: Robert Levine [facility] 08/08/2022 11:15 A M Medical Record Number: [id number] Patient Account Number: [id number] Date of Birth/Sex: Treating RN: 03-Jul-1955 (67 y.o. Robert Levine) Yevonne Pax Primary Care Mignon Bechler: Ilsa Iha Other Clinician: Referring Hawkins Seaman: Treating Essence Merle/Extender: RO BSO N, MICHA EL Janeth Rase in Treatment: 6 Clinic Level of Care  Assessment Items TOOL 1 Quantity Score []  - 0 Use when EandM and Procedure is performed on INITIAL visit ASSESSMENTS - Nursing Assessment / Reassessment []  - 0 General Physical Exam (combine w/ comprehensive assessment (listed just below) when performed on new pt. evals) []  - 0 Comprehensive Assessment (HX, ROS, Risk Assessments, Wounds Hx, etc.) ASSESSMENTS - Wound and Skin Assessment / Reassessment []  - 0 Dermatologic / Skin Assessment (not related to wound area) ASSESSMENTS - Ostomy and/or Continence Assessment and Care []  - 0 Incontinence Assessment and Management []  - 0 Ostomy Care Assessment and Management (repouching, etc.) PROCESS - Coordination of Care []  - 0 Simple Patient / Family Education for ongoing care []  - 0 Complex (extensive) Patient / Family Education for ongoing care []  - 0 Staff obtains Chiropractor, Records, T Results / Process Orders est []  - 0 Staff telephones HHA, Nursing Homes / Clarify orders / etc []  - 0 Routine Transfer to another Facility (non-emergent condition) []  - 0 Routine Hospital Admission (non-emergent condition) []  - 0 New Admissions / Insurance Authorizations / Ordering NPWT Apligraf, etcDHEERAN, BORTH ([id number]) [id number] Page 2 of 7 []  - 0 Emergency Hospital Admission (emergent condition) PROCESS - Special Needs []  - 0 Pediatric / Minor Patient Management []  - 0 Isolation Patient Management []  - 0 Hearing / Language / Visual special needs []  - 0 Assessment of Community assistance (transportation, D/C planning, etc.) []  - 0 Additional assistance / Altered mentation []  - 0 Support Surface(s) Assessment (bed, cushion, seat, etc.) INTERVENTIONS - Miscellaneous []  - 0 External ear exam []  - 0 Patient Transfer (multiple staff / Nurse, adult / Similar devices) []  - 0 Simple Staple / Suture removal (25 or less) []  - 0 Complex Staple / Suture removal (26 or more) []  - 0 Hypo/Hyperglycemic  Management (do not check if billed separately) []  - 0 Ankle / Brachial Index (ABI) - do not check if billed separately  Has the patient been seen at the hospital within the last three years: Yes Total Score: 0 Level Of Care: ____ Electronic Signature(s) Signed: 08/10/2022 9:26:52 AM By: Yevonne Pax RN Entered By: Yevonne Pax on 08/08/2022 11:17:04 -------------------------------------------------------------------------------- Encounter Discharge Information Details Patient Name: Date of Service: Robert Levine, [name] [facility] 08/08/2022 11:15 A M Medical Record Number: [id number] Patient Account Number: [id number] Date of Birth/Sex: Treating RN: 09-10-1955 (67 y.o. Robert Levine) Yevonne Pax Primary Care Lurline Caver: Ilsa Iha Other Clinician: Referring Meher Kucinski: Treating Noemie Devivo/Extender: Chauncey Mann, MICHA EL Janeth Rase in Treatment: 6 Encounter Discharge Information Items Post Procedure Vitals Discharge Condition: Stable Temperature (F): 97.8 Ambulatory Status: Ambulatory Pulse (bpm): 72 Discharge Destination: Home Respiratory Rate (breaths/min): 16 Transportation: Private Auto Blood Pressure (mmHg): 164/83 Accompanied By: self Schedule Follow-up Appointment: Yes Clinical Summary of Care: Electronic Signature(s) Signed: 08/10/2022 9:26:52 AM By: Yevonne Pax RN Entered By: Yevonne Pax on 08/08/2022 11:17:57 -------------------------------------------------------------------------------- Lower Extremity Assessment Details Patient Name: Date of Service: Robert Levine [facility] 08/08/2022 11:15 A M Medical Record Number: [id number] Patient Account Number: [id number] Date of Birth/Sex: Treating RN: [birth year] (67 y.o. Robert Levine) Yevonne Pax Primary Care Da Michelle: Ilsa Iha Other Clinician: Referring Najeeb Uptain: Treating Leilana Mcquire/Extender: RO BSO Dorris Carnes, MICHA EL Janeth Rase in Treatment: 6 Vascular Assessment Pulses: Dorsalis EMRAN, GRGAS ([id number])  [Right:[id number] Page 3 of 7 Yes] Electronic Signature(s) Signed: 08/10/2022 9:26:52 AM By: Yevonne Pax RN Entered By: Yevonne Pax on 08/08/2022 11:08:02 -------------------------------------------------------------------------------- Multi Wound Chart Details Patient Name: Date of Service: Robert Levine, [name] [facility] 08/08/2022 11:15 A M Medical Record Number: [id number] Patient Account Number: [id number] Date of Birth/Sex: Treating RN: 10-08-1955 (67 y.o. Robert Levine) Yevonne Pax Primary Care Aquinnah Devin: Ilsa Iha Other Clinician: Referring Breda Bond: Treating Margareth Kanner/Extender: RO BSO Dorris Carnes, MICHA EL Janeth Rase in Treatment: 6 Vital Signs Height(in): 77 Pulse(bpm): 72 Weight(lbs): 257 Blood Pressure(mmHg): 164/83 Body Mass Index(BMI): 30.5 Temperature(F): 97.8 Respiratory Rate(breaths/min): 16 [2:Photos:] [N/A:N/A] Right Amputation Site - Toe N/A N/A Wound Location: Surgical Injury N/A N/A Wounding Event: Diabetic Wound/Ulcer of the Lower N/A N/A Primary Etiology: Extremity Hypertension, Type II Diabetes N/A N/A Comorbid History: 06/03/2022 N/A N/A Date Acquired: 6 N/A N/A Weeks of Treatment: Open N/A N/A Wound Status: No N/A N/A Wound Recurrence: 1x2x0.01 N/A N/A Measurements L x W x D (cm) 1.571 N/A N/A A (cm) : rea 0.016 N/A N/A Volume (cm) : 50.60% N/A N/A % Reduction in A rea: 99.00% N/A N/A % Reduction in Volume: Grade 2 N/A N/A Classification: Medium N/A N/A Exudate A mount: Serosanguineous N/A N/A Exudate Type: red, brown N/A N/A Exudate Color: Medium (34-66%) N/A N/A Granulation A mount: Red, Pink N/A N/A Granulation Quality: Medium (34-66%) N/A N/A Necrotic A mount: Fat Layer (Subcutaneous Tissue): Yes N/A N/A Exposed Structures: Fascia: No Tendon: No Muscle: No Joint: No Bone: No None N/A N/A Epithelialization: Treatment Notes Electronic Signature(s) Signed: 08/10/2022 9:26:52 AM By: Yevonne Pax RN Entered  By: Yevonne Pax on 08/08/2022 11:08:06 [name] ([id number]) [id number] Page 4 of 7 -------------------------------------------------------------------------------- Multi-Disciplinary Care Plan Details Patient Name: Date of Service: Robert Levine [facility] 08/08/2022 11:15 A M Medical Record Number: [id number] Patient Account Number: [id number] Date of Birth/Sex: Treating RN: 09-Mar-1956 (67 y.o. Robert Levine) Yevonne Pax Primary Care Dainelle Hun: Ilsa Iha Other Clinician: Referring Idy Rawling: Treating Wakeelah Solan/Extender: RO BSO N, MICHA EL Janeth Rase in Treatment: 6 Active Inactive Wound/Skin Impairment Nursing Diagnoses: Impaired tissue integrity Knowledge deficit related to ulceration/compromised skin integrity Goals: Patient/caregiver will verbalize understanding of  skin care regimen Date Initiated: 06/27/2022 Target Resolution Date: 08/27/2022 Goal Status: Active Ulcer/skin breakdown will have a volume reduction of 30% by week 4 Date Initiated: 06/27/2022 Date Inactivated: 08/08/2022 Target Resolution Date: 07/28/2022 Unmet Reason: comorbidities / non Goal Status: Unmet compliance Ulcer/skin breakdown will have a volume reduction of 50% by week 8 Date Initiated: 06/27/2022 Target Resolution Date: 08/27/2022 Goal Status: Active Ulcer/skin breakdown will have a volume reduction of 80% by week 12 Date Initiated: 06/27/2022 Target Resolution Date: 09/27/2022 Goal Status: Active Ulcer/skin breakdown will heal within 14 weeks Date Initiated: 06/27/2022 Target Resolution Date: 10/11/2022 Goal Status: Active Interventions: Assess patient/caregiver ability to obtain necessary supplies Assess patient/caregiver ability to perform ulcer/skin care regimen upon admission and as needed Assess ulceration(s) every visit Provide education on ulcer and skin care Treatment Activities: Referred to DME Xzavior Reinig for dressing supplies : 06/27/2022 Skin care regimen  initiated : 06/27/2022 Notes: Electronic Signature(s) Signed: 08/10/2022 9:26:52 AM By: Yevonne Pax RN Entered By: Yevonne Pax on 08/08/2022 11:08:59 -------------------------------------------------------------------------------- Pain Assessment Details Patient Name: Date of Service: Robert Levine [facility] 08/08/2022 11:15 A M Medical Record Number: [id number] Patient Account Number: [id number] Date of Birth/Sex: Treating RN: [birth year] (66 y.o. [name] Primary Care Mammie Meras: Ilsa Iha Other Clinician: Referring Stephaie Dardis: Treating Maija Biggers/Extender: RO BSO N, MICHA EL Janeth Rase in Treatment: 6 Active Problems Location of Pain Severity and Description of Pain Patient Has Paino No Site Locations [place], Greggory Stallion ([id number]) [id number] Page 5 of 7 Pain Management and Medication Current Pain Management: Electronic Signature(s) Signed: 08/10/2022 9:26:52 AM By: Yevonne Pax RN Entered By: Yevonne Pax on 08/08/2022 11:04:39 -------------------------------------------------------------------------------- Patient/Caregiver Education Details Patient Name: Date of Service: Robert Levine, [name] 5/8/2024andnbsp11:15 A M Medical Record Number: [id number] Patient Account Number: [id number] Date of Birth/Gender: Treating RN: February 21, 1956 (67 y.o. Robert Levine) Yevonne Pax Primary Care Physician: Ilsa Iha Other Clinician: Referring Physician: Treating Physician/Extender: Chauncey Mann, MICHA EL Janeth Rase in Treatment: 6 Education Assessment Education Provided To: Patient Education Topics Provided Pressure: Handouts: Pressure Injury: Prevention and Offloading Methods: Explain/Verbal Responses: State content correctly Wound Debridement: Handouts: Wound Debridement Methods: Explain/Verbal Responses: State content correctly Electronic Signature(s) Signed: 08/10/2022 9:26:52 AM By: Yevonne Pax RN Entered By: Yevonne Pax on 08/08/2022  11:09:25 -------------------------------------------------------------------------------- Wound Assessment Details Patient Name: Date of Service: Robert Levine [facility] 08/08/2022 11:15 A M Medical Record Number: [id number] Patient Account Number: [id number] Date of Birth/Sex: Treating RN: March 19, 1956 (67 y.o. 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 Wound Status: Open Wounding Event: Surgical Injury Comorbid History: Hypertension, Type II Diabetes Date Acquired: 06/03/2022 Weeks Of Treatment: 6 Clustered Wound: No Photos Wound Measurements Length: (cm) 1 Width: (cm) 2 Depth: (cm) 0.01 Area: (cm) 1.571 Volume: (cm) 0.016 % Reduction in Area: 50.6% % Reduction in Volume: 99% Epithelialization: None Tunneling: No Undermining: No Wound Description Classification: Grade 2 Exudate Amount: Medium Exudate Type: Serosanguineous Exudate Color: red, brown Foul Odor After Cleansing: No Slough/Fibrino Yes Wound Bed Granulation Amount: Medium (34-66%) Exposed Structure Granulation Quality: Red, Pink Fascia Exposed: No Necrotic Amount: Medium (34-66%) Fat Layer (Subcutaneous Tissue) Exposed: Yes Necrotic Quality: Adherent Slough Tendon Exposed: No Muscle Exposed: No Joint Exposed: No Bone Exposed: No Treatment Notes Wound #2 (  Amputation Site - Toe) Wound Laterality: Right Cleanser Soap and Water Discharge Instruction: Gently cleanse wound with antibacterial soap, rinse and pat dry prior to dressing wounds Peri-Wound Care Topical Primary Dressing Hydrofera Blue Ready Transfer Foam, 2.5x2.5 (in/in) Discharge Instruction: Apply Hydrofera  Blue Ready to wound bed as directed Secondary Dressing Secured With Medipore T - 59M Medipore H Soft Cloth Surgical T ape ape, 2x2 (in/yd) Compression Wrap Compression Stockings Add-Ons Electronic Signature(s) Signed: 08/10/2022 9:26:52 AM By: Yevonne Pax RN Entered By: Yevonne Pax on 08/08/2022 11:07:49 [name] ([id number]) [id number] Page 7 of 7 -------------------------------------------------------------------------------- Vitals Details Patient Name: Date of Service: Robert Levine [facility] 08/08/2022 11:15 A M Medical Record Number: [id number] Patient Account Number: [id number] Date of Birth/Sex: Treating RN: [birth year] (67 y.o. Robert Levine) Yevonne Pax Primary Care Juanantonio Stolar: Ilsa Iha Other Clinician: Referring Justeen Hehr: Treating Avigayil Ton/Extender: RO BSO N, MICHA EL Janeth Rase in Treatment: 6 Vital Signs Time Taken: 11:04 Temperature (F): 97.8 Height (in): 77 Pulse (bpm): 72 Weight (lbs): 257 Respiratory Rate (breaths/min): 16 Body Mass Index (BMI): 30.5 Blood Pressure (mmHg): 164/83 Reference Range: 80 - 120 mg / dl Electronic Signature(s) Signed: 08/10/2022 9:26:52 AM By: Yevonne Pax RN Entered By: Yevonne Pax on 08/08/2022 11:04:33

## 2022-08-10 NOTE — Progress Notes (Signed)
CHANDLER, ANDERSEN ([id number]) [id number] Page 1 of 6 Visit Report for 08/08/2022 Robert Details Patient Name: Date of Service: Robert Levine [facility] 08/08/2022 11:15 A M Medical Record Number: [id number] Patient Account Number: [id number] Date of Birth/Sex: Treating RN: [birth year] (67 y.o. [name]) Yevonne Pax Primary Care Provider: Ilsa Iha Other Clinician: Referring Provider: Treating Provider/Extender: RO BSO Dorris Carnes, MICHA EL Janeth Rase in Treatment: 6 Robert Performed for Assessment: Wound #2 Right Amputation Site - Toe Performed By: Physician Maxwell Caul, MD Robert Type: Robert Severity of Tissue Pre Robert: Fat layer exposed Level of Consciousness (Pre-procedure): Awake and Alert Pre-procedure Verification/Time Out Yes - 11:14 Taken: Start Time: 11:14 Percent of Wound Bed Debrided: 100% T Area Debrided (cm): otal 1.57 Tissue and other material debrided: Viable, Non-Viable, Callus, Subcutaneous Level: Skin/Subcutaneous Tissue Robert Description: Excisional Instrument: Curette Bleeding: Minimum Hemostasis Achieved: Pressure End Time: 11:18 Procedural Pain: 0 Post Procedural Pain: 0 Response to Treatment: Procedure was tolerated well Level of Consciousness (Post- Awake and Alert procedure): Post Robert Measurements of Total Wound Length: (cm) 1 Width: (cm) 2 Depth: (cm) 0.1 Volume: (cm) 0.157 Character of Wound/Ulcer Post Robert: Improved Severity of Tissue Post Robert: Fat layer exposed Post Procedure Diagnosis Same as Pre-procedure Electronic Signature(s) Signed: 08/08/2022 4:41:35 PM By: Baltazar Najjar MD Signed: 08/10/2022 9:26:52 AM By: Yevonne Pax RN Entered By: Yevonne Pax on 08/08/2022 11:16:19 -------------------------------------------------------------------------------- Robert Details Patient Name: Date of Service: [name], [name] 08/08/2022 11:15 A M Medical Record Number:  [id number] Patient Account Number: [id number] Date of Birth/Sex: Treating RN: [birth year] (67 y.o. [name] Primary Care Provider: Ilsa Iha Other Clinician: Referring Provider: Treating Provider/Extender: RO BSO N, MICHA EL Janeth Rase in Treatment: 6 History of Present Illness Robert Description: 01-15-2022 patient presents today as a referral from Dr. Rosetta Posner who is a local podiatrist due to a foot wound that is not getting better. The patient unfortunately has had this going on for quite some time he is not necessarily the best historian which makes it a little bit difficult but nonetheless I did review notes as well together where things stand what is going on right now has been using Vaseline over the area according to what he tells me he has had debridements with Dr. Excell Seltzer intermittently. With that being said the patient has not really been using his offloading shoe as much she does have diabetic shoes and he tells me that is primarily what has been wearing. 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 He also has high blood pressure. [name], [name] ([id number]) [id number] Page 2 of 6 01-25-2022 upon evaluation today patient's wound actually showing signs of improvement though there is still little bit of callus and some biofilm and slough buildup on the surface of the wound. I Minna perform sharp Robert to clear this away today. He tells me has been using the shoe a lot of the time though sometimes when he is at the assisted living facility he just walks around with a sock not using the shoe. I explained that he needs to make sure to always have the shoe on when he is up and moving around as can help this to heal most effectively. 02-01-2022 upon evaluation today patient appears to be  doing well currently in regard to his wound from the standpoint of this not getting worse it  does not look to be infected. With that being said unfortunately he does have an open wound that is going to require some sharp Robert today and again I am concerned that if we do not get this closed he is going to end up with this worsening in general. He has not really been wearing the postop shoe with front offloading unfortunately which means that he is not really getting much better. I think that a total contact cast is probably going to be the method in which to try to get this healed as quickly as possible. Again the sooner we get healed lessen the chance that this will develop into a more significant ulceration requiring any type of surgery. We especially would avoid any chance for amputation. 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 Here for application of TCC #1 11/9; obligatory first total contact cast change. The patient had no particular problems. TCC reapplied 02-15-2022 upon evaluation today patient appears to be doing well currently in regard to his wound. There is some callus however buildup that is going to need to be cleared away. Fortunately there does not appear to be any signs of infection this is actually his third cast application today although the first 2 were last week and this is already quite a bit smaller. 02-19-2022 upon evaluation today patient's wound actually is showing signs of doing well with the cast he is actually making good progress. Fortunately I do not see any evidence of infection locally or systemically at this time which is great news overall I think he is on the right track. 02-26-2022 upon evaluation today patient actually appears to be completely healed which is great news. Fortunately I see no evidence of active infection at this time locally or systemically which is great news as well. 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 He has been treated in our clinic for this issue previously with a total contact cast. He has a podiatrist. He resides in a nursing facility. They have recommended he come in for his wound care. He is fairly unaware of the wound and its condition. 4/10; patient presents for follow-up. He has been using Hydrofera Blue to the wound bed along with his front offloading shoe on the right foot. He has taken his oral antibiotics without issues. 4/24; patient presents for follow-up. He has been using Hydrofera Blue to the wound bed. He has stopped using his front offloading shoe because he thinks it is not helping. He has no issues or complaints today. He would like to follow-up every 2 weeks. 5/8; this is a patient with a wound at the amp site of the right great toe. There is a small horizontal linear wound but surrounded with thick buildup of callus. He is intermittently using his forefoot offloading boot but even with this he probably is not walking in this properly not putting the weight back on his heel I went over this with him. If this does not work he probably needs a total contact cast and I think he has had 1 of these in the past. Not sure if there is an issue We have been using Hydrofera Blue as the primary dressing Electronic Signature(s) Signed: 08/08/2022 4:41:35 PM By:  Baltazar Najjar MD Entered By: Baltazar Najjar on 08/08/2022 11:37:09 -------------------------------------------------------------------------------- Physical Exam Details Patient Name: Date of Service: Robert Levine [facility] 08/08/2022 11:15 A M Medical Record Number: [id number] Patient Account Number: [id number] Date of Birth/Sex: Treating RN: May 17, 1955 (66 y.o. [name])  Yevonne Pax Primary Care Provider: Ilsa Iha Other Clinician: Referring Provider: Treating Provider/Extender: RO BSO N, MICHA EL Janeth Rase in Treatment: 6 Constitutional Patient is hypertensive.. Pulse regular and within target range for patient.Marland Kitchen Respirations regular, non-labored and within target range.. Temperature is normal and within the target range for the patient.Marland Kitchen appears in no distress. Notes Wound exam; right great toe amputation site. Thick raised callus. I used 2 #5 curette to remove as much of this as possible especially around the wound circumference. The wounds surface itself is actually only a small area. Debris removed with a #5 curette hemostasis with direct pressure I remove nonviable subcutaneous tissue. Fortunately there is no evidence of infection this does not probe to bone Electronic Signature(s) Signed: 08/08/2022 4:41:35 PM By: Baltazar Najjar MD Entered By: Baltazar Najjar on 08/08/2022 11:38:25 -------------------------------------------------------------------------------- Physician Orders Details Patient Name: Date of Service: [name], [name] 08/08/2022 11:15 A M Medical Record Number: [id number] Patient Account Number: [id number] Date of Birth/Sex: Treating RN: December 06, 1955 ([address] y.o. [name]) Draydon, Kano ([id number]) [id number] Page 3 of 6 Primary Care Provider: Ilsa Iha Other Clinician: Referring Provider: Treating Provider/Extender: Chauncey Mann, MICHA EL Janeth Rase in Treatment: 6 Verbal / Phone Orders: No Diagnosis Coding ICD-10 Coding Code Description E11.621 Type 2 diabetes mellitus with foot ulcer L97.512 Non-pressure chronic ulcer of other part of right foot with fat layer exposed Z89.411 Acquired absence of right great toe N18.30 Chronic kidney disease, stage 3 unspecified I10 Essential (primary) hypertension Follow-up Appointments Return Appointment in 1 week. Home  Health [facility] for wound care. May utilize formulary equivalent dressing for wound treatment orders unless otherwise specified. Home Health Nurse may visit PRN to address patients wound care needs. Aldine Contes [phone] Bathing/ Shower/ Hygiene Wash wounds with antibacterial soap and water. Off-Loading Other: - Front off loader Wound Treatment Wound #2 - Amputation Site - Toe Wound Laterality: Right Cleanser: Soap and Water 1 x Per Day/30 Days Discharge Instructions: Gently cleanse wound with antibacterial soap, rinse and pat dry prior to dressing wounds Prim Dressing: Hydrofera Blue Ready Transfer Foam, 2.5x2.5 (in/in) (Dispense As Written) 1 x Per Day/30 Days ary Discharge Instructions: Apply Hydrofera Blue Ready to wound bed as directed Secondary Dressing: Gauze 1 x Per Day/30 Days Secured With: Medipore T - 12M Medipore H Soft Cloth Surgical T ape ape, 2x2 (in/yd) 1 x Per Day/30 Days Electronic Signature(s) Signed: 08/08/2022 1:19:06 PM By: Yevonne Pax RN Signed: 08/08/2022 4:41:35 PM By: Baltazar Najjar MD Entered By: Yevonne Pax on 08/08/2022 13:19:06 -------------------------------------------------------------------------------- Problem List Details Patient Name: Date of Service: [name], [name] [facility] 08/08/2022 11:15 A M Medical Record Number: [id number] Patient Account Number: [id number] Date of Birth/Sex: Treating RN: 1956/03/21 (66 y.o. [name] Primary Care Provider: Ilsa Iha Other Clinician: Referring Provider: Treating Provider/Extender: RO BSO N, MICHA EL Janeth Rase in Treatment: 6 Active Problems ICD-10 Encounter Code Description Active Date MDM Diagnosis E11.621 Type 2 diabetes mellitus with foot ulcer 06/27/2022 No Yes L97.512 Non-pressure chronic ulcer of other part of right foot with fat layer exposed 06/27/2022 No Yes Z89.411 Acquired absence of right great toe 06/27/2022 No Yes JAILEN, KREISER ([id number])  [id number] Page 4 of  6 N18.30 Chronic kidney disease, stage 3 unspecified 06/27/2022 No Yes I10 Essential (primary) hypertension 06/27/2022 No Yes Inactive Problems Resolved Problems Electronic Signature(s) Signed: 08/08/2022 4:41:35 PM By: Baltazar Najjar MD Entered By: Baltazar Najjar on 08/08/2022 11:35:28 -------------------------------------------------------------------------------- Progress Note Details Patient Name: Date of Service: [name], [name] 08/08/2022 11:15 A M Medical Record Number: [id number] Patient Account Number: [id number] Date of Birth/Sex: Treating RN: 07-03-1955 (66 y.o. [name]) Yevonne Pax Primary Care Provider: Ilsa Iha Other Clinician: Referring Provider: Treating Provider/Extender: RO BSO N, MICHA EL Janeth Rase in Treatment: 6 Subjective History of Present Illness (Robert) 01-15-2022 patient presents today as a referral from Dr. Rosetta Posner who is a local podiatrist due to a foot wound that is not getting better. The patient unfortunately has had this going on for quite some time he is not necessarily the best historian which makes it a little bit difficult but nonetheless I did review notes as well together where things stand what is going on right now has been using Vaseline over the area according to what he tells me he has had debridements with Dr. Excell Seltzer intermittently. With that being said the patient has not really been using his offloading shoe as much she does have diabetic shoes and he tells me that is primarily what has been wearing. 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 He also has high blood pressure. 01-25-2022 upon evaluation today patient's wound actually showing signs of improvement though there is still little bit of callus and some biofilm and  slough buildup on the surface of the wound. I Minna perform sharp Robert to clear this away today. He tells me has been using the shoe a lot of the time though sometimes when he is at the assisted living facility he just walks around with a sock not using the shoe. I explained that he needs to make sure to always have the shoe on when he is up and moving around as can help this to heal most effectively. 02-01-2022 upon evaluation today patient appears to be doing well currently in regard to his wound from the standpoint of this not getting worse it does not look to be infected. With that being said unfortunately he does have an open wound that is going to require some sharp Robert today and again I am concerned that if we do not get this closed he is going to end up with this worsening in general. He has not really been wearing the postop shoe with front offloading unfortunately which means that he is not really getting much better. I think that a total contact cast is probably going to be the method in which to try to get this healed as quickly as possible. Again the sooner we get healed lessen the chance that this will develop into a more significant ulceration requiring any type of surgery. We especially would avoid any chance for amputation. 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 Here for application of TCC #1 11/9; obligatory first total contact cast change. The patient had no particular problems. TCC reapplied 02-15-2022 upon evaluation today patient appears to be doing well currently in regard to his wound. There is some callus however buildup that is going to need to be cleared away. Fortunately there does not appear to be any signs of infection this is actually his  third cast application today although the first 2 were last week and this is already quite a bit smaller. 02-19-2022 upon evaluation today patient's wound actually is showing signs of doing well with the cast  he is actually making good progress. Fortunately I do not see any evidence of infection locally or systemically at this time which is great news overall I think he is on the right track. 02-26-2022 upon evaluation today patient actually appears to be completely healed which is great news. Fortunately I see no evidence of active infection at this time locally or systemically which is great news as well. 06/27/2022 Mr. 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 He has been treated in our clinic for this issue previously with a total contact cast. He has a podiatrist. He resides in a nursing facility. They have recommended he come in for his wound care. He is fairly unaware of the wound and its condition. 4/10; patient presents for follow-up. He has been using Hydrofera Blue to the wound bed along with his front offloading shoe on the right foot. He has taken his oral antibiotics without issues. 4/24; patient presents for follow-up. He has been using Hydrofera Blue to the wound bed. He has stopped using his front offloading shoe because he thinks it is not helping. He has no issues or complaints today. He would like to follow-up every 2 weeks. 5/8; this is a patient with a wound at the amp site of the right great toe. There is a small horizontal linear wound but surrounded with thick buildup of callus. He is JB, KODA ([id number]) [id number] Page 5 of 6 intermittently using his forefoot offloading boot but even with this he probably is not walking in this properly not putting the weight back on his heel I went over this with him. If this does not work he probably needs a total contact cast and I think he  has had 1 of these in the past. Not sure if there is an issue We have been using Hydrofera Blue as the primary dressing Objective Constitutional Patient is hypertensive.. Pulse regular and within target range for patient.Marland Kitchen Respirations regular, non-labored and within target range.. Temperature is normal and within the target range for the patient.Marland Kitchen appears in no distress. Vitals Time Taken: 11:04 AM, Height: 77 in, Weight: 257 lbs, BMI: 30.5, Temperature: 97.8 F, Pulse: 72 bpm, Respiratory Rate: 16 breaths/min, Blood Pressure: 164/83 mmHg. General Notes: Wound exam; right great toe amputation site. Thick raised callus. I used 2 #5 curette to remove as much of this as possible especially around the wound circumference. The wounds surface itself is actually only a small area. Debris removed with a #5 curette hemostasis with direct pressure I remove nonviable subcutaneous tissue. Fortunately there is no evidence of infection this does not probe to bone Integumentary (Hair, Skin) Wound #2 status is Open. Original cause of wound was Surgical Injury. The date acquired was: 06/03/2022. The wound has been in treatment 6 weeks. The wound is located on the Right Amputation Site - T The wound measures 1cm length x 2cm width x 0.01cm depth; 1.571cm^2 area and 0.016cm^3 volume. There is oe. Fat Layer (Subcutaneous Tissue) exposed. There is no tunneling or undermining noted. There is a medium  amount of serosanguineous drainage noted. There is medium (34-66%) red, pink granulation within the wound bed. There is a medium (34-66%) amount of necrotic tissue within the wound bed including Adherent Slough. 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 There was a Excisional Skin/Subcutaneous Tissue Robert with a total area of 1.57 sq cm performed by Maxwell Caul, MD. With the following instrument(s): Curette to remove Viable and Non-Viable tissue/material. Material removed includes Callus and Subcutaneous Tissue and. No specimens were taken. A time out was conducted at 11:14, prior to the start of the procedure. A Minimum amount of bleeding was controlled with Pressure. The procedure was tolerated well with a pain level of 0 throughout and a pain level of 0 following the procedure. Post Robert Measurements: 1cm length x 2cm width x 0.1cm depth; 0.157cm^3 volume. Character of Wound/Ulcer Post Robert is improved. Severity of Tissue Post Robert is: Fat layer exposed. Post procedure Diagnosis Wound #2: Same as Pre-Procedure Plan Follow-up Appointments: Return Appointment in 1 week. Home Health: [facility] for wound care. May utilize formulary equivalent dressing for wound treatment orders unless otherwise specified. Home Health Nurse may visit PRN to address patientoos wound care needs. Aldine Contes [phone] Bathing/ Shower/ Hygiene: Wash wounds with antibacterial soap and water. Off-Loading: Other: - Front off loader WOUND #2: - Amputation Site - T oe Wound Laterality: Right Cleanser: Soap and Water 1 x Per Day/30 Days Discharge Instructions: Gently cleanse wound with antibacterial soap, rinse and pat dry prior to dressing wounds Prim Dressing: Hydrofera Blue Ready Transfer Foam, 2.5x2.5 (in/in) (Dispense As Written) 1 x Per Day/30 Days ary Discharge Instructions: Apply Hydrofera Blue Ready to wound bed as directed CHERIF, VEREB ([id number]) [id number] Page 6 of 6 Secured With: Medipore T - 2M Medipore  H Soft Cloth Surgical T ape ape, 2x2 (in/yd) 1 x Per Day/30 Days 1. Plantar right foot wound. Thick callus buildup indicates inadequate pressure and shear offloading. I went over this with him. It turns out he is not wearing this reliably. I talked to him about a total contact cast. He has had this before. If this does not make further progress I would put him in a cast. He laments the issue of not driving although it does not sound like he really needs to driveoolives in an assisted living. 2. We been using Hydrofera Blue as the primary dressing and that we will continue. Electronic Signature(s) Signed: 08/08/2022 4:41:35 PM By: Baltazar Najjar MD Entered By: Baltazar Najjar on 08/08/2022 11:39:36 -------------------------------------------------------------------------------- SuperBill Details Patient Name: Date of Service: [name], [name] [facility] 08/08/2022 Medical Record Number: [id number] Patient Account Number: [id number] Date of Birth/Sex: Treating RN: 09/26/1955 (66 y.o. [name]) Yevonne Pax Primary Care Provider: Ilsa Iha Other Clinician: Referring Provider: Treating Provider/Extender: Chauncey Mann, MICHA EL Janeth Rase in Treatment: 6 Diagnosis Coding ICD-10 Codes Code Description E11.621 Type 2 diabetes mellitus with foot ulcer L97.512 Non-pressure chronic ulcer of other part of right foot with fat layer exposed Z89.411 Acquired absence of right great toe N18.30 Chronic kidney disease, stage 3 unspecified I10 Essential (primary) hypertension Facility Procedures :  CPT4 Code: [id number] Description: 11042 - DEB SUBQ TISSUE 20 SQ CM/< ICD-10 Diagnosis Description L97.512 Non-pressure chronic ulcer of other part of right foot with fat layer exposed E11.621 Type 2 diabetes mellitus with foot ulcer Modifier: Quantity: 1 Physician Procedures : CPT4 Code Description Modifier [id number] 11042 - WC PHYS SUBQ TISS 20 SQ CM ICD-10 Diagnosis Description L97.512 Non-pressure chronic ulcer of  other part of right foot with fat layer exposed E11.621 Type 2 diabetes mellitus with foot ulcer Quantity: 1 Electronic Signature(s) Signed: 08/08/2022 4:41:35 PM By: Baltazar Najjar MD Entered By: Baltazar Najjar on 08/08/2022 11:39:50

## 2022-08-16 ENCOUNTER — Encounter: Payer: Medicare Other | Admitting: Physician Assistant

## 2022-08-16 DIAGNOSIS — E11621 Type 2 diabetes mellitus with foot ulcer: Secondary | ICD-10-CM | POA: Diagnosis not present

## 2022-08-29 ENCOUNTER — Encounter (HOSPITAL_BASED_OUTPATIENT_CLINIC_OR_DEPARTMENT_OTHER): Payer: Medicare Other | Admitting: Internal Medicine

## 2022-08-29 DIAGNOSIS — E11621 Type 2 diabetes mellitus with foot ulcer: Secondary | ICD-10-CM | POA: Diagnosis not present

## 2022-08-29 DIAGNOSIS — L97512 Non-pressure chronic ulcer of other part of right foot with fat layer exposed: Secondary | ICD-10-CM

## 2022-09-05 ENCOUNTER — Ambulatory Visit: Payer: Medicare Other | Admitting: Internal Medicine

## 2022-09-05 ENCOUNTER — Encounter: Payer: Medicare Other | Attending: Internal Medicine | Admitting: Internal Medicine

## 2022-09-05 DIAGNOSIS — N183 Chronic kidney disease, stage 3 unspecified: Secondary | ICD-10-CM | POA: Insufficient documentation

## 2022-09-05 DIAGNOSIS — E11621 Type 2 diabetes mellitus with foot ulcer: Secondary | ICD-10-CM

## 2022-09-05 DIAGNOSIS — Z89411 Acquired absence of right great toe: Secondary | ICD-10-CM | POA: Diagnosis not present

## 2022-09-05 DIAGNOSIS — E1142 Type 2 diabetes mellitus with diabetic polyneuropathy: Secondary | ICD-10-CM | POA: Diagnosis not present

## 2022-09-05 DIAGNOSIS — L97512 Non-pressure chronic ulcer of other part of right foot with fat layer exposed: Secondary | ICD-10-CM | POA: Diagnosis not present

## 2022-09-05 DIAGNOSIS — I129 Hypertensive chronic kidney disease with stage 1 through stage 4 chronic kidney disease, or unspecified chronic kidney disease: Secondary | ICD-10-CM | POA: Diagnosis not present

## 2022-09-05 DIAGNOSIS — Z794 Long term (current) use of insulin: Secondary | ICD-10-CM | POA: Insufficient documentation

## 2022-09-05 DIAGNOSIS — E1122 Type 2 diabetes mellitus with diabetic chronic kidney disease: Secondary | ICD-10-CM | POA: Diagnosis not present

## 2022-09-07 ENCOUNTER — Ambulatory Visit: Payer: Medicare Other | Admitting: Physician Assistant

## 2022-09-12 ENCOUNTER — Encounter (HOSPITAL_BASED_OUTPATIENT_CLINIC_OR_DEPARTMENT_OTHER): Payer: Medicare Other | Admitting: Internal Medicine

## 2022-09-12 DIAGNOSIS — E11621 Type 2 diabetes mellitus with foot ulcer: Secondary | ICD-10-CM

## 2022-09-12 DIAGNOSIS — L97512 Non-pressure chronic ulcer of other part of right foot with fat layer exposed: Secondary | ICD-10-CM | POA: Diagnosis not present

## 2022-09-14 ENCOUNTER — Encounter: Payer: Medicare Other | Admitting: Physician Assistant

## 2022-09-14 DIAGNOSIS — E11621 Type 2 diabetes mellitus with foot ulcer: Secondary | ICD-10-CM | POA: Diagnosis not present

## 2022-09-15 NOTE — Progress Notes (Signed)
IDELL, MATTHEY ([id number]) [id number] Page 1 of 8 Visit Report for 09/14/2022 Chief Complaint Document Details Patient Name: Date of Service: Robert Levine [facility] 09/14/2022 8:15 A M Medical Record Number: [id number] Patient Account Number: [id number] Date of Birth/Sex: Treating RN: [birth year] (67 y.o. Robert Levine Primary Care Provider: Ilsa Iha Other Clinician: Referring Provider: Treating Provider/Extender: Roxine Caddy in Treatment: 11 Information Obtained from: Patient Chief Complaint 06/27/2022; Right great toe ulcer at amputation site Electronic Signature(s) Signed: 09/14/2022 8:17:46 AM By: Allen Derry PA-C Entered By: Allen Derry on 09/14/2022 08:17:46 -------------------------------------------------------------------------------- HPI Details Patient Name: Date of Service: Robert Levine, Robert [facility] 09/14/2022 8:15 A M Medical Record Number: [id number] Patient Account Number: [id number] Date of Birth/Sex: Treating RN: 12/28/1955 (67 y.o. Robert Levine Primary Care Provider: Ilsa Iha Other Clinician: Referring Provider: Treating Provider/Extender: Roxine Caddy in Treatment: 11 History of Present Illness HPI Description: 01-15-2022 patient presents today as a referral from Dr. Rosetta Posner who is a local podiatrist due to a foot wound that is not getting better. The patient unfortunately has had this going on for quite some time he is not necessarily the best historian which makes it a little bit difficult but nonetheless I did review notes as well together where things stand what is going on right now has been using Vaseline over the area according to what he tells me he has had debridements with Dr. Excell Seltzer intermittently. With that being said the patient has not really been using his offloading shoe as much she does have diabetic shoes and he tells me that is primarily what has been wearing. 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 He also has high blood pressure. 01-25-2022 upon evaluation today patient's wound actually showing signs of improvement though there is still little bit of callus and some biofilm and slough buildup on the surface of the wound. I Minna perform sharp debridement to clear this away today. He tells me has been using the shoe a lot of the time though sometimes when he is at the assisted living facility he just walks around with a sock not using the shoe. I explained that he needs to make sure to always have the shoe on when he is up and moving around as can help this to heal most effectively. 02-01-2022 upon evaluation today patient appears to be doing well currently in regard to his wound from the standpoint of this not getting worse it does not look to be infected. With that being said unfortunately he does have an open wound that is going to require some sharp debridement today and again I am concerned that if we do not get this closed he is going to end up with this worsening in general. He has not really been wearing the postop shoe with front ELIOR, ALOI ([id number]) [id number] Page 2 of 8 offloading unfortunately which means that he is not really getting much better. I think that a total contact cast is probably going to be the method in which to try to get this healed as quickly as possible. Again the sooner we get healed lessen the chance that this will develop into a more significant ulceration requiring any type of surgery. We especially would avoid any chance for amputation. 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 Here for application of TCC #  1 11/9; obligatory first total contact cast change. The patient had no particular problems. TCC reapplied 02-15-2022 upon  evaluation today patient appears to be doing well currently in regard to his wound. There is some callus however buildup that is going to need to be cleared away. Fortunately there does not appear to be any signs of infection this is actually his third cast application today although the first 2 were last week and this is already quite a bit smaller. 02-19-2022 upon evaluation today patient's wound actually is showing signs of doing well with the cast he is actually making good progress. Fortunately I do not see any evidence of infection locally or systemically at this time which is great news overall I think he is on the right track. 02-26-2022 upon evaluation today patient actually appears to be completely healed which is great news. Fortunately I see no evidence of active infection at this time locally or systemically which is great news as well. 06/27/2022 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 He has been treated in our clinic for this issue previously with a total contact cast. He has a podiatrist. He resides in a nursing facility. They have recommended he come in for his wound care. He is fairly unaware of the wound and its condition. 4/10; patient presents for follow-up. He has been using Hydrofera Blue to the wound bed along with his front offloading shoe on the right foot. He has taken his oral antibiotics without issues. 4/24; patient presents for follow-up. He has been using Hydrofera Blue to the wound bed. He has stopped using his front offloading shoe because he thinks it is not helping. He has no issues or complaints today. He would like to follow-up every 2 weeks. 5/8; this is a patient with a wound at the amp  site of the right great toe. There is a small horizontal linear wound but surrounded with thick buildup of callus. He is intermittently using his forefoot offloading boot but even with this he probably is not walking in this properly not putting the weight back on his heel I went over this with him. If this does not work he probably needs a total contact cast and I think he has had 1 of these in the past. Not sure if there is an issue We have been using Hydrofera Blue as the primary dressing 08-16-2022 this is a gentleman that I have previously seen in the clinic although the patient tells me that this is not doing too poorly in fact he feels like that it is doing better. He does have home health coming out. With that being said I do not see any signs of active infection locally nor systemically at this time which is great news. He does have a tremendous amount of callus however there is no need to be managed. 6/1; we did not have a total contact cast to apply and I think the wound on the plantar right first metatarsal head was about the same. He is using a forefoot off loader and dressing with Hydrofera Blue. Unfortunately he has significant callus around the wound area 5/29; patient presents for follow-up. He has been using Hydrofera Blue to the wound bed. The wound has declined in appearance.  We discussed a total contact cast and patient was agreeable to move forward with this at next clinic visit. 6/12; patient presents for follow-up. He is ready for the total contact cast placement today. Wound is overall stable from last visit. No signs of infection. 09-14-2022 upon evaluation today patient appears to be doing well currently in regard to his wound after 2 days he is already showing signs of improvement which is great news. Fortunately I do not see any signs of active infection locally nor systemically at this time. Electronic Signature(s) Signed: 09/14/2022 8:56:35 AM By: Allen Derry PA-C Entered  By: Allen Derry on 09/14/2022 08:56:35 -------------------------------------------------------------------------------- Physical Exam Details Patient Name: Date of Service: Robert Levine [facility] 09/14/2022 8:15 A M Medical Record Number: [id number] Patient Account Number: [id number] Date of Birth/Sex: Treating RN: January 29, 1956 (67 y.o. Robert Levine Primary Care Provider: Ilsa Iha Other Clinician: Referring Provider: Treating Provider/Extender: Roxine Caddy in Treatment: 11 Constitutional Well-nourished and well-hydrated in no acute distress. Respiratory normal breathing without difficulty. Robert Levine, Robert Levine ([id number]) [id number] Page 3 of 8 Psychiatric this patient is able to make decisions and demonstrates good insight into disease process. Alert and Oriented x 3. pleasant and cooperative. Notes Upon inspection patient's wound bed actually showed signs of improvement. I do feel like that we are working towards closure with a cast he is only had 2 days of it is already looking a little bit better we will plan to see where things stand next week and then we will make any adjustments in care as we need to following. Electronic Signature(s) Signed: 09/14/2022 8:56:54 AM By: Allen Derry PA-C Entered By: Allen Derry on 09/14/2022 08:56:53 -------------------------------------------------------------------------------- Physician Orders Details Patient Name: Date of Service: Robert Levine, Robert [facility] 09/14/2022 8:15 A M Medical Record Number: [id number] Patient Account Number: [id number] Date of Birth/Sex: Treating RN: 06/30/1955 (67 y.o. Robert Levine Primary Care Provider: Ilsa Iha Other Clinician: Referring Provider: Treating Provider/Extender: Roxine Caddy in Treatment: 11 Verbal / Phone Orders: No Diagnosis Coding ICD-10 Coding Code Description E11.621 Type 2 diabetes mellitus with foot ulcer L97.512  Non-pressure chronic ulcer of other part of right foot with fat layer exposed Z89.411 Acquired absence of right great toe N18.30 Chronic kidney disease, stage 3 unspecified I10 Essential (primary) hypertension Follow-up Appointments Return Appointment in 1 week. Nurse Visit as needed - friday Home Health Home Health Company: - [facility] for wound care. May utilize formulary equivalent dressing for wound treatment orders unless otherwise specified. Home Health Nurse may visit PRN to address patients wound care needs. - place on hold , patient wearing a cast,[facility] [phone] Scheduled days for dressing changes to be completed; exception, patient has scheduled wound care visit that day. **Please direct any NON-WOUND related issues/requests for orders to patient's Primary Care Physician. **If current dressing causes regression in wound condition, may D/C ordered dressing product/s and apply Normal Saline Moist Dressing daily until next Wound Healing Center or Other MD appointment. **Notify Wound Healing Center of regression in wound condition at [phone]. Bathing/ [employer] wounds with antibacterial soap and water. Anesthetic (Use 'Patient Medications' Section for Anesthetic Order Entry) Lidocaine applied to wound bed Edema Control - Lymphedema / Segmental Compressive Device / Other Elevate, Exercise Daily and A void Standing for Long Periods of Time. Elevate leg(s) parallel to the floor when sitting. DO YOUR BEST to sleep in the bed at night. DO NOT sleep in your recliner. Long hours of sitting in  a recliner leads to swelling of the legs and/or potential wounds on your backside. Off-Loading Total Contact Cast to Right Lower Extremity Wound Treatment Wound #2 - Amputation Site - Toe Wound Laterality: Right Cleanser: Soap and Water 1 x Per Day/30 Days Robert Levine, Robert Levine ([id number]) [phone].pdf Page 4 of 8 Discharge  Instructions: Gently cleanse wound with antibacterial soap, rinse and pat dry prior to dressing wounds Prim Dressing: Hydrofera Blue Ready Transfer Foam, 2.5x2.5 (in/in) (Dispense As Written) 1 x Per Day/30 Days ary Discharge Instructions: Apply Hydrofera Blue Ready to wound bed as directed Secondary Dressing: Gauze 1 x Per Day/30 Days Secured With: Medipore T - 57M Medipore H Soft Cloth Surgical T ape ape, 2x2 (in/yd) 1 x Per Day/30 Days Secured With: [employer] Sterile or Non-Sterile 6-ply 4.5x4 (yd/yd) 1 x Per Day/30 Days Discharge Instructions: Apply Kerlix as directed Secured With: foam cup 1 x Per Day/30 Days Electronic Signature(s) Signed: 09/14/2022 1:44:39 PM By: Allen Derry PA-C Signed: 09/14/2022 1:57:56 PM By: Angelina Pih Entered By: Angelina Pih on 09/14/2022 09:17:57 -------------------------------------------------------------------------------- Problem List Details Patient Name: Date of Service: Robert Levine, Robert [facility] 09/14/2022 8:15 A M Medical Record Number: [id number] Patient Account Number: [id number] Date of Birth/Sex: Treating RN: 10/30/1955 (67 y.o. Robert Levine Primary Care Provider: Ilsa Iha Other Clinician: Referring Provider: Treating Provider/Extender: Roxine Caddy in Treatment: 11 Active Problems ICD-10 Encounter Code Description Active Date MDM Diagnosis E11.621 Type 2 diabetes mellitus with foot ulcer 06/27/2022 No Yes L97.512 Non-pressure chronic ulcer of other part of right foot with fat layer exposed 06/27/2022 No Yes Z89.411 Acquired absence of right great toe 06/27/2022 No Yes N18.30 Chronic kidney disease, stage 3 unspecified 06/27/2022 No Yes I10 Essential (primary) hypertension 06/27/2022 No Yes Inactive Problems Resolved Problems Robert Levine, Robert Levine ([id number]) [id number] Page 5 of 8 Electronic Signature(s) Signed: 09/14/2022 8:17:22 AM By: Allen Derry PA-C Entered By: Allen Derry on  09/14/2022 08:17:21 -------------------------------------------------------------------------------- Progress Note Details Patient Name: Date of Service: Robert Levine, Robert [facility] 09/14/2022 8:15 A M Medical Record Number: [id number] Patient Account Number: [id number] Date of Birth/Sex: Treating RN: 1955-06-24 (67 y.o. Robert Levine Primary Care Provider: Ilsa Iha Other Clinician: Referring Provider: Treating Provider/Extender: Roxine Caddy in Treatment: 11 Subjective Chief Complaint Information obtained from Patient 06/27/2022; Right great toe ulcer at amputation site History of Present Illness (HPI) 01-15-2022 patient presents today as a referral from Dr. Rosetta Posner who is a local podiatrist due to a foot wound that is not getting better. The patient unfortunately has had this going on for quite some time he is not necessarily the best historian which makes it a little bit difficult but nonetheless I did review notes as well together where things stand what is going on right now has been using Vaseline over the area according to what he tells me he has had debridements with Dr. Excell Seltzer intermittently. With that being said the patient has not really been using his offloading shoe as much she does have diabetic shoes and he tells me that is primarily what has been wearing. 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 He also has high blood pressure. 01-25-2022 upon evaluation today patient's wound actually showing signs of improvement though there is still little bit of callus and some biofilm and slough buildup on the surface of the wound. I Minna perform sharp  debridement to clear this away today. He tells me has been using the shoe a lot of the time though sometimes when he is at the assisted living facility he just  walks around with a sock not using the shoe. I explained that he needs to make sure to always have the shoe on when he is up and moving around as can help this to heal most effectively. 02-01-2022 upon evaluation today patient appears to be doing well currently in regard to his wound from the standpoint of this not getting worse it does not look to be infected. With that being said unfortunately he does have an open wound that is going to require some sharp debridement today and again I am concerned that if we do not get this closed he is going to end up with this worsening in general. He has not really been wearing the postop shoe with front offloading unfortunately which means that he is not really getting much better. I think that a total contact cast is probably going to be the method in which to try to get this healed as quickly as possible. Again the sooner we get healed lessen the chance that this will develop into a more significant ulceration requiring any type of surgery. We especially would avoid any chance for amputation. 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 Here for application of TCC #1 11/9; obligatory first total contact cast change. The patient had no particular problems. TCC reapplied 02-15-2022 upon evaluation today patient appears to be doing well currently in regard to his wound. There is some callus however buildup that is going to need to be cleared away. Fortunately there does not appear to be any signs of infection this is actually his third cast application today although the first 2 were last week and this is already quite a bit smaller. 02-19-2022 upon evaluation today patient's wound actually is showing signs of doing well with the cast he is actually making good progress. Fortunately I do not see any evidence of infection locally or systemically at this time which is great news overall I think he is on the right track. 02-26-2022 upon evaluation today patient  actually appears to be completely healed which is great news. Fortunately I see no evidence of active infection at this time locally or systemically which is great news as well. 06/27/2022 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 He has been treated in our clinic for this issue previously with a total contact cast. He has a podiatrist. He resides in a nursing facility. They have recommended he come in for his wound care. He is fairly unaware of the wound and its condition. 4/10; patient presents for follow-up. He has been using Hydrofera Blue to the wound bed along with his front offloading shoe on the right foot. He has taken his oral antibiotics without issues. 4/24; patient presents for follow-up. He has been using Hydrofera Blue to the wound bed. He has stopped using his front offloading shoe because he thinks it is not helping. He has no issues or complaints today. He would like to follow-up every 2 weeks. 5/8; this is a patient with a wound at the amp site of the right great toe. There  is a small horizontal linear wound but surrounded with thick buildup of callus. He is intermittently using his forefoot offloading boot but even with this he probably is not walking in this properly not putting the weight back on his heel I went over this with him. If this does not work he probably needs a total contact cast and I think he has had 1 of these in the past. Not sure if there is an issue Robert Levine, Robert Levine ([id number]) [id number] Page 6 of 8 We have been using Hydrofera Blue as the primary dressing 08-16-2022 this is a gentleman that I have previously seen in the clinic although the patient tells me that  this is not doing too poorly in fact he feels like that it is doing better. He does have home health coming out. With that being said I do not see any signs of active infection locally nor systemically at this time which is great news. He does have a tremendous amount of callus however there is no need to be managed. 6/1; we did not have a total contact cast to apply and I think the wound on the plantar right first metatarsal head was about the same. He is using a forefoot off loader and dressing with Hydrofera Blue. Unfortunately he has significant callus around the wound area 5/29; patient presents for follow-up. He has been using Hydrofera Blue to the wound bed. The wound has declined in appearance. We discussed a total contact cast and patient was agreeable to move forward with this at next clinic visit. 6/12; patient presents for follow-up. He is ready for the total contact cast placement today. Wound is overall stable from last visit. No signs of infection. 09-14-2022 upon evaluation today patient appears to be doing well currently in regard to his wound after 2 days he is already showing signs of improvement which is great news. Fortunately I do not see any signs of active infection locally nor systemically at this time. Objective Constitutional Well-nourished and well-hydrated in no acute distress. Vitals Time Taken: 8:08 AM, Height: 77 in, Weight: 257 lbs, BMI: 30.5, Temperature: 97.8 F, Pulse: 77 bpm, Respiratory Rate: 18 breaths/min, Blood Pressure: 144/78 mmHg. Respiratory normal breathing without difficulty. Psychiatric this patient is able to make decisions and demonstrates good insight into disease process. Alert and Oriented x 3. pleasant and cooperative. General Notes: Upon inspection patient's wound bed actually showed signs of improvement. I do feel like that we are working towards closure with a cast he is only had 2 days of it is already looking a little bit better we will  plan to see where things stand next week and then we will make any adjustments in care as we need to following. Integumentary (Hair, Skin) Wound #2 status is Open. Original cause of wound was Surgical Injury. The date acquired was: 06/03/2022. The wound has been in treatment 11 weeks. The wound is located on the Right Amputation Site - T The wound measures 1cm length x 1.2cm width x 0.2cm depth; 0.942cm^2 area and 0.188cm^3 volume. oe. There is Fat Layer (Subcutaneous Tissue) exposed. There is no tunneling or undermining noted. There is a medium amount of serosanguineous drainage noted. There is small (1-33%) pink, pale granulation within the wound bed. There is a large (67-100%) amount of necrotic tissue within the wound bed including Adherent Slough. 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 Nurse Visit as needed - friday Home Health: Home Health Company: - [facility] for wound care. May utilize formulary equivalent dressing for wound treatment orders unless otherwise specified. Home Health Nurse may visit PRN to address patients wound care needs. - place on hold , patient wearing a cast,[facility] [phone] Scheduled days for dressing changes to be completed; exception, patient has scheduled wound care visit that day. **Please direct any NON-WOUND related issues/requests for orders to patient's Primary Care Physician. **If current dressing causes regression in wound condition, may D/C ordered dressing product/s and apply Normal Saline Moist Dressing daily until next Wound Healing Center or Other MD appointment. **Notify Wound Healing Center of regression in wound condition at  [phone]. Bathing/ Shower/ Hygiene: Wash wounds with antibacterial soap and water. Anesthetic (Use 'Patient Medications' Section for Anesthetic Order Entry): Robert Levine, Robert Levine ([id number]) [id number] Page 7 of 8 Lidocaine applied to wound bed Edema Control - Lymphedema / Segmental Compressive Device / Other: Elevate, Exercise Daily and Avoid Standing for Long Periods of Time. Elevate leg(s) parallel to the floor when sitting. DO YOUR BEST to sleep in the bed at night. DO NOT sleep in your recliner. Long hours of sitting in a recliner leads to swelling of the legs and/or potential wounds on your backside. Off-Loading: T Contact Cast to Right Lower Extremity otal WOUND #2: - Amputation Site - T oe Wound Laterality: Right Cleanser: Soap and Water 1 x Per Day/30 Days Discharge Instructions: Gently cleanse wound with antibacterial soap, rinse and pat dry prior to dressing wounds Topical: Gentamicin 1 x Per Day/30 Days Discharge Instructions: Apply as directed by provider. Prim Dressing: Hydrofera Blue Ready Transfer Foam, 2.5x2.5 (in/in) (Dispense As Written) 1 x Per Day/30 Days ary Discharge Instructions: Apply Hydrofera Blue Ready to wound bed as directed Secondary Dressing: Gauze 1 x Per Day/30 Days Secured With: Medipore T - 23M Medipore H Soft Cloth Surgical T ape ape, 2x2 (in/yd) 1 x Per Day/30 Days Secured With: [employer] Sterile or Non-Sterile 6-ply 4.5x4 (yd/yd) 1 x Per Day/30 Days Discharge Instructions: Apply Kerlix as directed Secured With: foam cup 1 x Per Day/30 Days 1. I am good recommend that we have the patient continue with total contact casting this was his first cast change there was no rubbing everything looked good and ready to continue with the size 3 cast since he did so well. 2. I am also going to recommend that the patient should continue to monitor for any signs of infection obviously this will be such as increased pain or  other issues with that being said obviously with a cast on he cannot visualize the wound bed. We will see where things stand at follow-up. We will see patient back for reevaluation in 1 week here in the clinic. If anything worsens or changes patient will contact our office for additional recommendations. Electronic Signature(s) Signed: 09/14/2022 8:57:23 AM By: Allen Derry PA-C Entered By: Allen Derry on 09/14/2022 08:57:23 -------------------------------------------------------------------------------- Total Contact Cast Details Patient Name: Date of Service: Robert Levine [facility] 09/14/2022 8:15 A M Medical Record Number: [id number] Patient Account Number: [id number] Date of Birth/Sex: Treating RN: 1956/03/03 (67 y.o. 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 Performed By:  Physician Allen Derry, PA-C Post Procedure Diagnosis Same as Pre-procedure Notes size three applied, pt tolerated first cast well without issue Electronic Signature(s) Signed: 09/14/2022 1:44:39 PM By: Allen Derry PA-C Signed: 09/14/2022 1:57:56 PM By: Angelina Pih Entered By: Angelina Pih on 09/14/2022 09:19:43 [name] ([id number]) [id number] Page 8 of 8 -------------------------------------------------------------------------------- SuperBill Details Patient Name: Date of Service: Robert Levine [facility] 09/14/2022 Medical Record Number: [id number] Patient Account Number: [id number] Date of Birth/Sex: Treating RN: 1956-01-20 (67 y.o. 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 N18.30 Chronic kidney disease, stage 3 unspecified I10 Essential (primary) hypertension Facility Procedures : CPT4 Code: [id number] Description: [phone] - APPLY TOTAL CONTACT LEG CAST ICD-10 Diagnosis Description L97.512 Non-pressure chronic ulcer of other part of right foot with fat layer exposed Modifier: Quantity: 1 Physician Procedures : CPT4 Code Description Modifier [id number] - WC PHYS APPLY TOTAL CONTACT CAST ICD-10 Diagnosis Description L97.512 Non-pressure chronic ulcer of other part of right foot with fat layer exposed Quantity: 1 Electronic Signature(s) Signed: 09/14/2022 8:57:37 AM By: Allen Derry PA-C Entered By: Allen Derry on 09/14/2022 08:57:37

## 2022-09-15 NOTE — Progress Notes (Signed)
[name], [name] ([id number]) [id number] Page 1 of 9 Visit Report for 09/12/2022 Chief Complaint Document Details Patient Name: Date of Service: Robert Levine [facility] 09/12/2022 3:45 PM Medical Record Number: [id number] Patient Account Number: [id number] Date of Birth/Sex: Treating RN: December 23, 1955 (67 y.o. Robert Levine) Yevonne Pax Primary Care Provider: Ilsa Iha Other Clinician: Referring Provider: Treating Provider/Extender: Ammie Ferrier in Treatment: 11 Information Obtained from: Patient Chief Complaint 06/27/2022; Right great toe ulcer at amputation site Electronic Signature(s) Signed: 09/12/2022 4:14:13 PM By: Geralyn Corwin DO Entered By: Geralyn Corwin on 09/12/2022 15:57:14 -------------------------------------------------------------------------------- Debridement Details Patient Name: Date of Service: Robert Levine, [name] [facility] 09/12/2022 3:45 PM Medical Record Number: [id number] Patient Account Number: [id number] Date of Birth/Sex: Treating RN: 1955-04-16 (67 y.o. Robert Levine Primary Care Provider: Ilsa Iha Other Clinician: Referring Provider: Treating Provider/Extender: Ammie Ferrier in Treatment: 11 Debridement Performed for Assessment: Wound #2 Right Amputation Site - Toe Performed By: Physician Geralyn Corwin, MD Debridement Type: Debridement Severity of Tissue Pre Debridement: Fat layer exposed Level of Consciousness (Pre-procedure): Awake and Alert Pre-procedure Verification/Time Out Yes - 15:40 Taken: Start Time: 15:40 Percent of Wound Bed Debrided: 100% T Area Debrided (cm): otal 5.5 Tissue and other material debrided: Viable, Non-Viable, Callus, Slough, Subcutaneous, Slough Level: Skin/Subcutaneous Tissue Debridement Description: Excisional Instrument: Curette Bleeding: Minimum End Time: 15:48 Procedural Pain: 0 Post Procedural Pain: 0 Response to Treatment: Procedure was tolerated  well [name] ([id number]) [id number] Page 2 of 9 Level of Consciousness (Post- Awake and Alert procedure): Post Debridement Measurements of Total Wound Length: (cm) 3.5 Width: (cm) 2 Depth: (cm) 0.2 Volume: (cm) 1.1 Character of Wound/Ulcer Post Debridement: Improved Severity of Tissue Post Debridement: Fat layer exposed Post Procedure Diagnosis Same as Pre-procedure Electronic Signature(s) Signed: 09/12/2022 4:14:13 PM By: Geralyn Corwin DO Signed: 09/13/2022 4:13:25 PM By: Yevonne Pax RN Entered By: Yevonne Pax on 09/12/2022 15:46:53 -------------------------------------------------------------------------------- HPI Details Patient Name: Date of Service: Robert Levine, [name] [facility] 09/12/2022 3:45 PM Medical Record Number: [id number] Patient Account Number: [id number] Date of Birth/Sex: Treating RN: 1955-08-12 (67 y.o. Robert Levine) Primary Care Provider: Ilsa Iha Other Clinician: Referring Provider: Treating Provider/Extender: Ammie Ferrier in Treatment: 11 History of Present Illness HPI Description: 01-15-2022 patient presents today as a referral from Dr. Rosetta Posner who is a local podiatrist due to a foot wound that is not getting better. The patient unfortunately has had this going on for quite some time he is not necessarily the best historian which makes it a little bit difficult but nonetheless I did review notes as well together where things stand what is going on right now has been using Vaseline over the area according to what he tells me he has had debridements with Dr. Excell Seltzer intermittently. With that being said the patient has not really been using his offloading shoe as much she does have diabetic shoes and he tells me that is primarily what has been wearing. 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 He also has high blood pressure. 01-25-2022 upon evaluation today patient's wound actually showing signs of improvement though there is still little bit of callus and some biofilm and slough buildup on the surface of the wound. I Minna perform sharp debridement to clear this away today. He tells me has been using the shoe a lot of the time though  sometimes when he is at the assisted living facility he just walks around with a sock not using the shoe. I explained that he needs to make sure to always have the shoe on when he is up and moving around as can help this to heal most effectively. 02-01-2022 upon evaluation today patient appears to be doing well currently in regard to his wound from the standpoint of this not getting worse it does not look to be infected. With that being said unfortunately he does have an open wound that is going to require some sharp debridement today and again I am concerned that if we do not get this closed he is going to end up with this worsening in general. He has not really been wearing the postop shoe with front offloading unfortunately which means that he is not really getting much better. I think that a total contact cast is probably going to be the method in which to try to get this healed as quickly as possible. Again the sooner we get healed lessen the chance that this will develop into a more significant ulceration requiring any type of surgery. We especially would avoid any chance for amputation. 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 Here for application of TCC #1 11/9; obligatory first total contact cast change. The patient had no particular problems. TCC reapplied 02-15-2022 upon evaluation today patient appears to be doing well currently in regard to his wound. There is some callus however buildup that is going to need to be cleared away. Fortunately there does not appear to be any signs of  infection this is actually his third cast application today although the first 2 were last week and this is already quite a bit smaller. 02-19-2022 upon evaluation today patient's wound actually is showing signs of doing well with the cast he is actually making good progress. Fortunately I do not see any evidence of infection locally or systemically at this time which is great news overall I think he is on the right track. 02-26-2022 upon evaluation today patient actually appears to be completely healed which is great news. Fortunately I see no evidence of active infection at this time locally or systemically which is great news as well. 06/27/2022 [name] ([id number]) [id number] Page 3 of 9 Mr. 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 He has been treated in our clinic for this issue previously with a total contact cast. He has a podiatrist. He resides in a nursing facility. They have recommended he come in for his wound care. He is fairly unaware of the wound and its condition. 4/10; patient presents for follow-up. He has been using Hydrofera Blue to the wound bed along with his front offloading shoe on the right foot. He has taken his oral antibiotics without issues. 4/24; patient presents for follow-up. He has been using Hydrofera Blue to the wound bed. He has stopped using his front offloading shoe because he thinks it is not helping. He has no issues or complaints today. He would like to follow-up every 2 weeks. 5/8; this is a patient with a wound at the amp site of the right great toe. There is a small horizontal linear wound but surrounded with thick buildup of callus.  He is intermittently  using his forefoot offloading boot but even with this he probably is not walking in this properly not putting the weight back on his heel I went over this with him. If this does not work he probably needs a total contact cast and I think he has had 1 of these in the past. Not sure if there is an issue We have been using Hydrofera Blue as the primary dressing 08-16-2022 this is a gentleman that I have previously seen in the clinic although the patient tells me that this is not doing too poorly in fact he feels like that it is doing better. He does have home health coming out. With that being said I do not see any signs of active infection locally nor systemically at this time which is great news. He does have a tremendous amount of callus however there is no need to be managed. 6/1; we did not have a total contact cast to apply and I think the wound on the plantar right first metatarsal head was about the same. He is using a forefoot off loader and dressing with Hydrofera Blue. Unfortunately he has significant callus around the wound area 5/29; patient presents for follow-up. He has been using Hydrofera Blue to the wound bed. The wound has declined in appearance. We discussed a total contact cast and patient was agreeable to move forward with this at next clinic visit. 6/12; patient presents for follow-up. He is ready for the total contact cast placement today. Wound is overall stable from last visit. No signs of infection. Electronic Signature(s) Signed: 09/12/2022 4:14:13 PM By: Geralyn Corwin DO Entered By: Geralyn Corwin on 09/12/2022 15:57:45 -------------------------------------------------------------------------------- Physical Exam Details Patient Name: Date of Service: Robert Levine [facility] 09/12/2022 3:45 PM Medical Record Number: [id number] Patient Account Number: [id number] Date of Birth/Sex: Treating RN: [birth year] (66 y.o. Robert Levine Primary Care Provider: Ilsa Iha Other  Clinician: Referring Provider: Treating Provider/Extender: Ammie Ferrier in Treatment: 11 Constitutional . Cardiovascular . Psychiatric . Notes Previous right great toe amputation. T the plantar aspect of the previous amputation site there is an open wound with granualtion tissue, non viable tissue and o callus. No signs of surrounding infection Including increased warmth, erythema or purulent drainage. Electronic Signature(s) Signed: 09/12/2022 4:14:13 PM By: Geralyn Corwin DO Entered By: Geralyn Corwin on 09/12/2022 15:59:00 [name] ([id number]) [id number] Page 4 of 9 -------------------------------------------------------------------------------- Physician Orders Details Patient Name: Date of Service: Robert Levine [facility] 09/12/2022 3:45 PM Medical Record Number: [id number] Patient Account Number: [id number] Date of Birth/Sex: Treating RN: 1955-04-12 (67 y.o. Robert Levine) Yevonne Pax Primary Care Provider: Ilsa Iha Other Clinician: Referring Provider: Treating Provider/Extender: Ammie Ferrier in Treatment: 11 Verbal / Phone Orders: No Diagnosis Coding ICD-10 Coding Code Description E11.621 Type 2 diabetes mellitus with foot ulcer L97.512 Non-pressure chronic ulcer of other part of right foot with fat layer exposed Z89.411 Acquired absence of right great toe N18.30 Chronic kidney disease, stage 3 unspecified I10 Essential (primary) hypertension Follow-up Appointments Return Appointment in 1 week. Nurse Visit as needed - friday Home Health Home Health Company: - [facility] for wound care. May utilize formulary equivalent dressing for wound treatment orders unless otherwise specified. Home Health Nurse may visit PRN to address patients wound care needs. - place on hold , patient wearing a cast,[facility] [id number] Scheduled days for dressing changes to be completed;  exception, patient has scheduled wound care visit that day. **Please direct  any NON-WOUND related issues/requests for orders to patient's Primary Care Physician. **If current dressing causes regression in wound condition, may D/C ordered dressing product/s and apply Normal Saline Moist Dressing daily until next Wound Healing Center or Other MD appointment. **Notify Wound Healing Center of regression in wound condition at [phone]. Bathing/ [employer] wounds with antibacterial soap and water. Anesthetic (Use 'Patient Medications' Section for Anesthetic Order Entry) Lidocaine applied to wound bed Edema Control - Lymphedema / Segmental Compressive Device / Other Elevate, Exercise Daily and A void Standing for Long Periods of Time. Elevate leg(s) parallel to the floor when sitting. DO YOUR BEST to sleep in the bed at night. DO NOT sleep in your recliner. Long hours of sitting in a recliner leads to swelling of the legs and/or potential wounds on your backside. Off-Loading Total Contact Cast to Right Lower Extremity Wound Treatment Wound #2 - Amputation Site - Toe Wound Laterality: Right Cleanser: Soap and Water 1 x Per Day/30 Days Discharge Instructions: Gently cleanse wound with antibacterial soap, rinse and pat dry prior to dressing wounds Topical: Gentamicin 1 x Per Day/30 Days Discharge Instructions: Apply as directed by provider. Topical: Mupirocin Ointment 1 x Per Day/30 Days Discharge Instructions: Apply as directed by provider. Prim Dressing: Hydrofera Blue Ready Transfer Foam, 2.5x2.5 (in/in) (Dispense As Written) 1 x Per Day/30 Days ary Discharge Instructions: Apply Hydrofera Blue Ready to wound bed as directed Secondary Dressing: Gauze 1 x Per Day/30 Days Secured With: Medipore T - 81M Medipore H Soft Cloth Surgical T ape ape, 2x2 (in/yd) 1 x Per Day/30 Days WILBUR, WESSELMANN ([id number]) [id number] Page 5 of 9 Secured With: [employer] or Non-Sterile 6-ply 4.5x4 (yd/yd) 1 x Per Day/30 Days Discharge Instructions: Apply Kerlix as directed Secured With: foam cup 1 x Per Day/30 Days Electronic Signature(s) Signed: 09/12/2022 4:14:13 PM By: Geralyn Corwin DO Previous Signature: 09/12/2022 4:08:49 PM Version By: Yevonne Pax RN Entered By: Geralyn Corwin on 09/12/2022 16:12:39 -------------------------------------------------------------------------------- Problem List Details Patient Name: Date of Service: Robert Levine, [name] [facility] 09/12/2022 3:45 PM Medical Record Number: [id number] Patient Account Number: [id number] Date of Birth/Sex: Treating RN: Dec 02, 1955 (66 y.o. Robert Levine Primary Care Provider: Ilsa Iha Other Clinician: Referring Provider: Treating Provider/Extender: Ammie Ferrier in Treatment: 11 Active Problems ICD-10 Encounter Code Description Active Date MDM Diagnosis E11.621 Type 2 diabetes mellitus with foot ulcer 06/27/2022 No Yes L97.512 Non-pressure chronic ulcer of other part of right foot with fat layer exposed 06/27/2022 No Yes Z89.411 Acquired absence of right great toe 06/27/2022 No Yes N18.30 Chronic kidney disease, stage 3 unspecified 06/27/2022 No Yes I10 Essential (primary) hypertension 06/27/2022 No Yes Inactive Problems Resolved Problems Electronic Signature(s) Signed: 09/12/2022 4:14:13 PM By: Geralyn Corwin DO Entered By: Geralyn Corwin on 09/12/2022 15:57:09 [name] ([id number]) [id number] Page 6 of 9 -------------------------------------------------------------------------------- Progress Note Details Patient Name: Date of Service: Robert Levine [facility] 09/12/2022 3:45 PM Medical Record Number: [id number] Patient Account Number: [id number] Date of Birth/Sex: Treating RN: 1955/06/18 (67 y.o. Robert Levine) Yevonne Pax Primary Care Provider: Ilsa Iha Other Clinician: Referring Provider: Treating Provider/Extender: Ammie Ferrier in Treatment: 11 Subjective Chief Complaint Information obtained from Patient 06/27/2022; Right great toe ulcer at amputation site History of Present Illness (HPI) 01-15-2022 patient presents today as a referral from Dr. Rosetta Posner who is a local podiatrist due to a foot wound that is not getting better. The patient unfortunately has had this going on for quite some time he  is not necessarily the best historian which makes it a little bit difficult but nonetheless I did review notes as well together where things stand what is going on right now has been using Vaseline over the area according to what he tells me he has had debridements with Dr. Excell Seltzer intermittently. With that being said the patient has not really been using his offloading shoe as much she does have diabetic shoes and he tells me that is primarily what has been wearing. 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 He also has high blood pressure. 01-25-2022 upon evaluation today patient's wound actually showing signs of improvement though there is still little bit of callus and some biofilm and slough buildup on the surface of the wound. I Minna perform sharp debridement to clear this away today. He tells me has been using the shoe a lot of the time though sometimes when he is at the assisted living facility he just walks around with a sock not using the shoe. I explained that he needs to make sure to always have the shoe on when he is up and moving around as can help this to heal most effectively. 02-01-2022 upon evaluation today patient appears to be doing well currently in regard to his wound from the standpoint of this not getting worse it does not look to be infected. With that being said unfortunately he does have an open wound that is going to  require some sharp debridement today and again I am concerned that if we do not get this closed he is going to end up with this worsening in general. He has not really been wearing the postop shoe with front offloading unfortunately which means that he is not really getting much better. I think that a total contact cast is probably going to be the method in which to try to get this healed as quickly as possible. Again the sooner we get healed lessen the chance that this will develop into a more significant ulceration requiring any type of surgery. We especially would avoid any chance for amputation. 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 Here for application of TCC #1 11/9; obligatory first total contact cast change. The patient had no particular problems. TCC reapplied 02-15-2022 upon evaluation today patient appears to be doing well currently in regard to his wound. There is some callus however buildup that is going to need to be cleared away. Fortunately there does not appear to be any signs of infection this is actually his third cast application today although the first 2 were last week and this is already quite a bit smaller. 02-19-2022 upon evaluation today patient's wound actually is showing signs of doing well with the cast he is actually making good progress. Fortunately I do not see any evidence of infection locally or systemically at this time which is great news overall I think he is on the right track. 02-26-2022 upon evaluation today patient actually appears to be completely healed which is great news. Fortunately I see no evidence of active infection at this time locally or systemically which is great news as well. 06/27/2022 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 He has been treated in our clinic for this issue previously with a total contact cast. He has a podiatrist. He resides in a nursing facility. They have recommended he come in for his wound care. He is fairly unaware of the wound and its condition. 4/10; patient presents for follow-up. He has been using Hydrofera Blue to the wound bed along with his front offloading shoe on the right foot. He has taken his oral antibiotics without issues. 4/24; patient presents for follow-up. He has been using Hydrofera Blue to the wound bed. He has stopped using his front offloading shoe because he thinks it is not helping. He has no issues or complaints today. He would like to follow-up every 2 weeks. 5/8; this is a patient with a wound at the amp site of the right great toe. There is a small horizontal linear wound but surrounded with thick buildup of callus. He is intermittently using his forefoot offloading boot but even with this he probably is not walking in this properly not putting the weight back on his heel I went over this with him. If this does not work he probably needs a total contact cast and I think he has had 1 of these in the past. Not sure if there is an issue We have been using Hydrofera Blue as the primary dressing 08-16-2022 this is a gentleman that I have previously seen in the clinic although the patient tells me that this is not doing too poorly in fact he feels like that it is doing better. He does have home health coming out. With that being said I do not see any signs of active infection locally nor systemically at this time which is great news. He does have a tremendous amount of callus however there is no need to be managed. Robert, Levine ([id number]) [id number] Page 7 of 9 6/1; we did not have a total contact cast to apply and I think the wound on the plantar right first metatarsal head was  about the same. He is using a forefoot off loader and dressing with Hydrofera Blue. Unfortunately he has significant callus around the wound area 5/29; patient presents for follow-up. He has been using Hydrofera Blue to the wound bed. The wound has declined in appearance. We discussed a total contact cast and patient was agreeable to move forward with this at next clinic visit. 6/12; patient presents for follow-up. He is ready for the total contact cast placement today. Wound is overall stable from last visit. No signs of infection. Objective Constitutional Vitals Time Taken: 3:59 PM, Height: 77 in, Weight: 257 lbs, BMI: 30.5, Temperature: 98.3 F, Pulse: 77 bpm, Respiratory Rate: 18 breaths/min, Blood Pressure: 163/73 mmHg. General Notes: Previous right great toe amputation. T the plantar aspect of the previous amputation site there is an open wound with granualtion tissue, non o viable tissue and callus. No signs of surrounding infection Including increased warmth, erythema or purulent drainage. Integumentary (Hair, Skin) Wound #2 status is Open. Original cause of wound was Surgical Injury. The date acquired was: 06/03/2022. The wound has been in treatment 11 weeks. The wound is located on the Right Amputation Site - T The wound measures 3.5cm length x 2cm width x 0.2cm depth; 5.498cm^2 area and 1.1cm^3 volume. oe. There is Fat Layer (Subcutaneous Tissue) exposed. There is no tunneling or undermining noted. There is a medium amount of serosanguineous drainage noted. There is  small (1-33%) red, pink granulation within the wound bed. There is a large (67-100%) amount of necrotic tissue within the wound bed including Adherent Slough. 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 I debrided nonviable tissue. No signs of infection. Not a very deep wound. T contact cast was placed in standard fashion. He otal has an appointment in 2 days for obligatory cast change. He knows to not get the cast wet. We will use antibiotic ointment and Hydrofera Blue under the cast. Procedures Wound #2 Pre-procedure diagnosis of Wound #2 is a Diabetic Wound/Ulcer of the Lower Extremity located on the Right Amputation Site - T .Severity of Tissue Pre oe Debridement is: Fat layer exposed. There was a Excisional Skin/Subcutaneous Tissue Debridement with a total area of 5.5 sq cm performed by Geralyn Corwin, MD. With the following instrument(s): Curette to remove Viable and Non-Viable tissue/material. Material removed includes Callus, Subcutaneous Tissue, and [place]. No specimens were taken. A time out was conducted at 15:40, prior to the start of the procedure. A Minimum amount of bleeding was controlled with N/A. The procedure was tolerated well with a pain level of 0 throughout and a pain level of 0 following the procedure. Post Debridement Measurements: 3.5cm length x 2cm width x 0.2cm depth; 1.1cm^3 volume. Character of Wound/Ulcer Post Debridement is improved. Severity of Tissue Post Debridement is: Fat layer exposed. Post procedure Diagnosis Wound #2: Same as Pre-Procedure Plan Follow-up Appointments: Return Appointment in 1 week. Nurse Visit as needed - friday Home Health: Home Health Company: - [facility] for wound care. May utilize formulary equivalent dressing for wound treatment orders unless otherwise specified. Home Health Nurse may visit PRN to address patients wound care needs. - place on hold , patient wearing a cast,[facility] [phone] Scheduled days for dressing changes to be completed; exception, patient has scheduled wound care visit that day. **Please direct any NON-WOUND related issues/requests for orders  to patient's Primary Care Physician. **If current dressing causes regression in wound condition, may D/C ordered dressing product/s and apply Normal Saline Moist Dressing daily until next Wound Healing Center or Other MD appointment. **Notify Wound Healing Center of regression in wound condition at [phone]. Bathing/ Shower/ Hygiene: JADARIOUS, ALIZADEH ([id number]) [id number] Page 8 of 9 Wash wounds with antibacterial soap and water. Anesthetic (Use 'Patient Medications' Section for Anesthetic Order Entry): Lidocaine applied to wound bed Edema Control - Lymphedema / Segmental Compressive Device / Other: Elevate, Exercise Daily and Avoid Standing for Long Periods of Time. Elevate leg(s) parallel to the floor when sitting. DO YOUR BEST to sleep in the bed at night. DO NOT sleep in your recliner. Long hours of sitting in a recliner leads to swelling of the legs and/or potential wounds on your backside. Off-Loading: T Contact Cast to Right Lower Extremity otal WOUND #2: - Amputation Site - T oe Wound Laterality: Right Cleanser: Soap and Water 1 x Per Day/30 Days Discharge Instructions: Gently cleanse wound with antibacterial soap, rinse and pat dry prior to dressing wounds Topical: Gentamicin 1 x Per Day/30 Days Discharge Instructions: Apply as directed by provider. Topical: Mupirocin Ointment 1 x Per Day/30 Days Discharge Instructions: Apply as directed by provider. Prim Dressing: Hydrofera Blue Ready Transfer Foam, 2.5x2.5 (in/in) (Dispense As Written) 1 x Per Day/30 Days ary Discharge  Instructions: Apply Hydrofera Blue Ready to wound bed as directed Secondary Dressing: Gauze 1 x Per Day/30 Days Secured With: Medipore T - 42M Medipore H Soft Cloth Surgical T ape ape, 2x2 (in/yd) 1 x Per Day/30 Days Secured With: [employer] Sterile or Non-Sterile 6-ply 4.5x4 (yd/yd) 1 x Per Day/30 Days Discharge Instructions: Apply Kerlix as directed Secured With: foam cup 1  x Per Day/30 Days 1. In office sharp debridement 2. T contact cast placed in standard fashion otal 3. Hydrofera Blue and antibiotic ointment 4. Follow-up in 2 days for obligatory cast change and then 1 week for physician visit Electronic Signature(s) Signed: 09/12/2022 4:14:13 PM By: Geralyn Corwin DO Entered By: Geralyn Corwin on 09/12/2022 16:12:25 -------------------------------------------------------------------------------- SuperBill Details Patient Name: Date of Service: Robert Levine, [name] [facility] 09/12/2022 Medical Record Number: [id number] Patient Account Number: [id number] Date of Birth/Sex: Treating RN: 1956-03-22 (67 y.o. 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 N18.30 Chronic kidney disease, stage 3 unspecified I10 Essential (primary) hypertension Facility Procedures : ARMANNI, HADAWAY Code: 78295621 [place] ([id number]) Description: 11042 - DEB SUBQ TISSUE 20 SQ CM/< ICD-10 Diagnosis Description E11.621 Type 2 diabetes mellitus with foot ulcer L97.512 Non-pressure chronic ulcer of other part of right foot with fat layer exposed [phone] Modifier: [id number] Quantity: 1 f Page 9 of 9 Physician Procedures : CPT4 Code Description Modifier [id number] 11042 - WC PHYS SUBQ TISS 20 SQ CM ICD-10 Diagnosis Description E11.621 Type 2 diabetes mellitus with foot ulcer L97.512 Non-pressure chronic ulcer of other part of right foot with fat layer exposed Quantity: 1 Electronic Signature(s) Signed: 09/12/2022 4:14:13 PM By: Geralyn Corwin DO Entered By: Geralyn Corwin on 09/12/2022 16:12:30

## 2022-09-15 NOTE — Progress Notes (Signed)
Robert Levine ([id number]) [id number] Page 1 of 8 Visit Report for 09/12/2022 Arrival Information Details Patient Name: Date of Service: Robert Levine [facility] 09/12/2022 3:45 PM Medical Record Number: [id number] Patient Account Number: [id number] Date of Birth/Sex: Treating RN: Aug 24, 1955 (66 y.o. Robert Levine) Robert Levine Primary Care Robert Levine: Robert Levine Other Clinician: Referring Robert Levine: Treating Robert Levine/Extender: Robert Levine in Treatment: 11 Visit Information History Since Last Visit All ordered tests and consults were completed: No Patient Arrived: Ambulatory Added or deleted any medications: No Arrival Time: 15:38 Any new allergies or adverse reactions: No Accompanied By: self Had a fall or experienced change in No Transfer Assistance: None activities of daily living that may affect Patient Identification Verified: Yes risk of falls: Secondary Verification Process Completed: Yes Signs or symptoms of abuse/neglect since last visito No Patient Requires Transmission-Based Precautions: No Hospitalized since last visit: No Patient Has Alerts: Yes Implantable device outside of the clinic excluding No Patient Alerts: DM II cellular tissue based products placed in the center since last visit: Has Dressing in Place as Prescribed: Yes Pain Present Now: No Electronic Signature(s) Signed: 09/13/2022 4:13:25 PM By: Robert Pax RN Entered By: Robert Levine on 09/12/2022 15:38:59 -------------------------------------------------------------------------------- Clinic Level of Care Assessment Details Patient Name: Date of Service: Robert Levine [facility] 09/12/2022 3:45 PM Medical Record Number: [id number] Patient Account Number: [id number] Date of Birth/Sex: Treating RN: 22-Feb-1956 (66 y.o. Robert Levine) Robert Levine Primary Care Robert Levine: Robert Levine Other Clinician: Referring Robert Levine: Treating Robert Levine/Extender: Robert Levine in  Treatment: 11 Clinic Level of Care Assessment Items TOOL 1 Quantity Score []  - 0 Use when EandM and Procedure is performed on INITIAL visit ASSESSMENTS - Nursing Assessment / Reassessment []  - 0 General Physical Exam (combine w/ comprehensive assessment (listed just below) when performed on new pt. evals) []  - 0 Comprehensive Assessment (HX, ROS, Risk Assessments, Wounds Hx, etc.) Robert Levine, Robert Levine ([id number]) [id number] Page 2 of 8 ASSESSMENTS - Wound and Skin Assessment / Reassessment []  - 0 Dermatologic / Skin Assessment (not related to wound area) ASSESSMENTS - Ostomy and/or Continence Assessment and Care []  - 0 Incontinence Assessment and Management []  - 0 Ostomy Care Assessment and Management (repouching, etc.) PROCESS - Coordination of Care []  - 0 Simple Patient / Family Education for ongoing care []  - 0 Complex (extensive) Patient / Family Education for ongoing care []  - 0 Staff obtains Chiropractor, Records, T Results / Process Orders est []  - 0 Staff telephones HHA, Nursing Homes / Clarify orders / etc []  - 0 Routine Transfer to another Facility (non-emergent condition) []  - 0 Routine Hospital Admission (non-emergent condition) []  - 0 New Admissions / Manufacturing engineer / Ordering NPWT Apligraf, etc. , []  - 0 Emergency Hospital Admission (emergent condition) PROCESS - Special Needs []  - 0 Pediatric / Minor Patient Management []  - 0 Isolation Patient Management []  - 0 Hearing / Language / Visual special needs []  - 0 Assessment of Community assistance (transportation, D/C planning, etc.) []  - 0 Additional assistance / Altered mentation []  - 0 Support Surface(s) Assessment (bed, cushion, seat, etc.) INTERVENTIONS - Miscellaneous []  - 0 External ear exam []  - 0 Patient Transfer (multiple staff / Nurse, adult / Similar devices) []  - 0 Simple Staple / Suture removal (25 or less) []  - 0 Complex Staple / Suture removal (26 or  more) []  - 0 Hypo/Hyperglycemic Management (do not check if billed separately) []  - 0 Ankle / Brachial Index (ABI) - do not check if billed separately Has the  patient been seen at the hospital within the last three years: Yes Total Score: 0 Level Of Care: ____ Electronic Signature(s) Signed: 09/13/2022 4:13:25 PM By: Robert Pax RN Entered By: Robert Levine on 09/12/2022 16:12:11 -------------------------------------------------------------------------------- Encounter Discharge Information Details Patient Name: Date of Service: Robert Levine, [name] 09/12/2022 3:45 PM Medical Record Number: [id number] Patient Account Number: [id number] Date of Birth/Sex: Treating RN: 07/05/1955 (66 y.o. Robert Levine Primary Care Robert Levine: Robert Levine Other Clinician: Referring Robert Levine: Treating Robert Levine/Extender: Robert Levine, Robert Levine ([id number]) [id number] Page 3 of 8 Weeks in Treatment: 11 Encounter Discharge Information Items Post Procedure Vitals Discharge Condition: Stable Temperature (F): 98.3 Ambulatory Status: Ambulatory Pulse (bpm): 77 Discharge Destination: Home Respiratory Rate (breaths/min): 18 Transportation: Private Auto Blood Pressure (mmHg): 163/73 Accompanied By: self Schedule Follow-up Appointment: Yes Clinical Summary of Care: Electronic Signature(s) Signed: 09/12/2022 4:12:54 PM By: Robert Pax RN Entered By: Robert Levine on 09/12/2022 16:12:54 -------------------------------------------------------------------------------- Lower Extremity Assessment Details Patient Name: Date of Service: Robert Levine [facility] 09/12/2022 3:45 PM Medical Record Number: [id number] Patient Account Number: [id number] Date of Birth/Sex: Treating RN: 07-26-1955 (66 y.o. Robert Levine Primary Care Patrice Matthew: Robert Levine Other Clinician: Referring Raynell Scott: Treating Kania Regnier/Extender: Robert Levine in Treatment:  11 Edema Assessment Assessed: Robert Levine: No] [Right: No] Edema: [Left: Ye] [Right: s] Calf Left: Right: Point of Measurement: 40 cm From Medial Instep 42 cm Ankle Left: Right: Point of Measurement: 13 cm From Medial Instep 31 cm Vascular Assessment Pulses: Dorsalis Pedis Palpable: [Right:Yes] Electronic Signature(s) Signed: 09/13/2022 4:13:25 PM By: Robert Pax RN Entered By: Robert Levine on 09/12/2022 15:42:00 [name] ([id number]) [id number] Page 4 of 8 -------------------------------------------------------------------------------- Multi Wound Chart Details Patient Name: Date of Service: Robert Levine [facility] 09/12/2022 3:45 PM Medical Record Number: [id number] Patient Account Number: [id number] Date of Birth/Sex: Treating RN: [birth year] (66 y.o. Robert Levine) Robert Levine Primary Care Euline Kimbler: Robert Levine Other Clinician: Referring Sophia Cubero: Treating Darrious Youman/Extender: Robert Levine in Treatment: 11 Vital Signs Height(in): 77 Pulse(bpm): 77 Weight(lbs): 257 Blood Pressure(mmHg): 163/73 Body Mass Index(BMI): 30.5 Temperature(F): 98.3 Respiratory Rate(breaths/min): 18 [2:Photos:] [N/A:N/A] Right Amputation Site - Toe N/A N/A Wound Location: Surgical Injury N/A N/A Wounding Event: Diabetic Wound/Ulcer of the Lower N/A N/A Primary Etiology: Extremity Hypertension, Type II Diabetes N/A N/A Comorbid History: 06/03/2022 N/A N/A Date Acquired: 11 N/A N/A Weeks of Treatment: Open N/A N/A Wound Status: No N/A N/A Wound Recurrence: 3.5x2x0.2 N/A N/A Measurements L x W x D (cm) 5.498 N/A N/A A (cm) : rea 1.1 N/A N/A Volume (cm) : -72.80% N/A N/A % Reduction in A rea: 30.80% N/A N/A % Reduction in Volume: Grade 2 N/A N/A Classification: Medium N/A N/A Exudate A mount: Serosanguineous N/A N/A Exudate Type: red, brown N/A N/A Exudate Color: Small (1-33%) N/A N/A Granulation A mount: Red, Pink N/A N/A Granulation  Quality: Large (67-100%) N/A N/A Necrotic A mount: Fat Layer (Subcutaneous Tissue): Yes N/A N/A Exposed Structures: Fascia: No Tendon: No Muscle: No Joint: No Bone: No None N/A N/A Epithelialization: Treatment Notes Electronic Signature(s) Signed: 09/13/2022 4:13:25 PM By: Robert Pax RN Entered By: Robert Levine on 09/12/2022 15:42:05 [name] ([id number]) [id number] Page 5 of 8 -------------------------------------------------------------------------------- Multi-Disciplinary Care Plan Details Patient Name: Date of Service: Robert Levine [facility] 09/12/2022 3:45 PM Medical Record Number: [id number] Patient Account Number: [id number] Date of Birth/Sex: Treating RN: 01-08-1956 (66 y.o. Robert Levine) Robert Levine Primary Care Michal Strzelecki: Robert Levine Other Clinician: Referring Nakiea Metzner: Treating Jaslyn Bansal/Extender: Robert Levine  in Treatment: 11 Active Inactive Wound/Skin Impairment Nursing Diagnoses: Impaired tissue integrity Knowledge deficit related to ulceration/compromised skin integrity Goals: Patient/caregiver will verbalize understanding of skin care regimen Date Initiated: 06/27/2022 Target Resolution Date: 10/05/2022 Goal Status: Active Ulcer/skin breakdown will have a volume reduction of 30% by week 4 Date Initiated: 06/27/2022 Date Inactivated: 08/08/2022 Target Resolution Date: 07/28/2022 Unmet Reason: comorbidities / non Goal Status: Unmet compliance Ulcer/skin breakdown will have a volume reduction of 50% by week 8 Date Initiated: 06/27/2022 Date Inactivated: 09/12/2022 Target Resolution Date: 08/27/2022 Unmet Reason: comorbidities / non Goal Status: Unmet compliance Ulcer/skin breakdown will have a volume reduction of 80% by week 12 Date Initiated: 06/27/2022 Target Resolution Date: 09/27/2022 Goal Status: Active Ulcer/skin breakdown will heal within 14 weeks Date Initiated: 06/27/2022 Target Resolution Date:  10/11/2022 Goal Status: Active Interventions: Assess patient/caregiver ability to obtain necessary supplies Assess patient/caregiver ability to perform ulcer/skin care regimen upon admission and as needed Assess ulceration(s) every visit Provide education on ulcer and skin care Treatment Activities: Referred to DME Robert Levine for dressing supplies : 06/27/2022 Skin care regimen initiated : 06/27/2022 Notes: Electronic Signature(s) Signed: 09/13/2022 4:13:25 PM By: Robert Pax RN Entered By: Robert Levine on 09/12/2022 15:42:57 [name] ([id number]) [id number] Page 6 of 8 -------------------------------------------------------------------------------- Pain Assessment Details Patient Name: Date of Service: Robert Levine [facility] 09/12/2022 3:45 PM Medical Record Number: [id number] Patient Account Number: [id number] Date of Birth/Sex: Treating RN: 1955/11/17 (66 y.o. Robert Levine) Robert Levine Primary Care Markesha Hannig: Robert Levine Other Clinician: Referring Blong Busk: Treating Rayla Pember/Extender: Robert Levine in Treatment: 11 Active Problems Location of Pain Severity and Description of Pain Patient Has Paino No Site Locations Pain Management and Medication Current Pain Management: Electronic Signature(s) Signed: 09/13/2022 4:13:25 PM By: Robert Pax RN Entered By: Robert Levine on 09/12/2022 15:39:30 -------------------------------------------------------------------------------- Wound Assessment Details Patient Name: Date of Service: Robert Levine [facility] 09/12/2022 3:45 PM Medical Record Number: [id number] Patient Account Number: [id number] Date of Birth/Sex: Treating RN: 1955-04-18 (66 y.o. 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 Wound Status: Open Wounding Event: Surgical Injury Comorbid History: Hypertension, Type II Diabetes Date Acquired: 06/03/2022 Weeks Of Treatment: 11 Clustered Wound: No Photos Wound Measurements Length: (cm) 3.5 Width: (cm) 2 Depth: (cm) 0.2 Area: (cm) 5.498 Volume: (cm) 1.1 % Reduction in Area: -72.8% % Reduction in Volume: 30.8% Epithelialization: None Tunneling: No Undermining: No Wound Description Classification: Grade 2 Exudate Amount: Medium Exudate Type: Serosanguineous Exudate Color: red, brown Foul Odor After Cleansing: No Slough/Fibrino Yes Wound Bed Granulation Amount: Small (1-33%) Exposed Structure Granulation Quality: Red, Pink Fascia Exposed: No Necrotic Amount: Large (67-100%) Fat Layer (Subcutaneous Tissue) Exposed: Yes Necrotic Quality: Adherent Slough Tendon Exposed: No Muscle Exposed: No Joint Exposed: No Bone Exposed: No Electronic Signature(s) Signed: 09/13/2022 4:13:25 PM By: Robert Pax RN Entered By: Robert Levine on 09/12/2022 15:41:40 -------------------------------------------------------------------------------- Vitals Details Patient Name: Date of Service: Robert Levine, [name] 09/12/2022 3:45 PM Medical Record Number: [id number] Patient Account Number: [id number] Date of Birth/Sex: Treating RN: [birth year] (66 y.o. Robert Levine Primary Care Flannery Cavallero: Robert Levine Other Clinician: Referring [place]: Treating Yicel Shannon/Extender: Robert Levine in Treatment: [address] Vital Signs [name] ([id number]) [id number] Page 8 of 8 Time Taken: 15:59 Temperature (F): 98.3 Height (in):  77 Pulse (bpm): 77 Weight (lbs): 257 Respiratory Rate (breaths/min): 18 Body Mass Index (BMI): 30.5 Blood Pressure (mmHg): 163/73 Reference Range: 80 - 120 mg / dl Electronic  Signature(s) Signed: 09/13/2022 4:13:25 PM By: Robert Pax RN Entered By: Robert Levine on 09/12/2022 15:39:19

## 2022-09-16 NOTE — Progress Notes (Signed)
Robert Levine, Robert Levine ([id number]) [id number] Page 1 of 8 Visit Report for 09/14/2022 Arrival Information Details Patient Name: Date of Service: Robert Levine [facility] 09/14/2022 8:15 A M Medical Record Number: [id number] Patient Account Number: [id number] Date of Birth/Sex: Treating RN: 1955/09/16 (67 y.o. Robert Levine Primary Care Rande Dario: Ilsa Iha Other Clinician: Referring Keva Darty: Treating Harith Mccadden/Extender: Roxine Caddy in Treatment: 11 Visit Information History Since Last Visit Added or deleted any medications: No Patient Arrived: [name] Any new allergies or adverse reactions: No Arrival Time: 08:07 Had a fall or experienced change in No Transfer Assistance: None activities of daily living that may affect Patient Identification Verified: Yes risk of falls: Secondary Verification Process Completed: Yes Hospitalized since last visit: No Patient Requires Transmission-Based Precautions: No Has Dressing in Place as Prescribed: Yes Patient Has Alerts: Yes Has Footwear/Offloading in Place as Prescribed: Yes Patient Alerts: DM II Right: T Contact Cast otal Pain Present Now: No Electronic Signature(s) Signed: 09/14/2022 1:57:56 PM By: Angelina Pih Entered By: Angelina Pih on 09/14/2022 08:08:04 -------------------------------------------------------------------------------- Clinic Level of Care Assessment Details Patient Name: Date of Service: Robert Levine [facility] 09/14/2022 8:15 A M Medical Record Number: [id number] Patient Account Number: [id number] Date of Birth/Sex: Treating RN: 09-17-1955 (67 y.o. Robert Levine Primary Care Senovia Gauer: Ilsa Iha Other Clinician: Referring Mandolin Falwell: Treating Cy Bresee/Extender: Roxine Caddy in Treatment: 11 Clinic Level of Care Assessment Items TOOL 1 Quantity Score []  - 0 Use when EandM and Procedure is performed on INITIAL visit ASSESSMENTS - Nursing  Assessment / Reassessment []  - 0 General Physical Exam (combine w/ comprehensive assessment (listed just below) when performed on new pt. evals) []  - 0 Comprehensive Assessment (HX, ROS, Risk Assessments, Wounds Hx, etc.) ASSESSMENTS - Wound and Skin Assessment / Reassessment []  - 0 Dermatologic / Skin Assessment (not related to wound area) Robert Levine ([id number]) [id number] Page 2 of 8 ASSESSMENTS - Ostomy and/or Continence Assessment and Care []  - 0 Incontinence Assessment and Management []  - 0 Ostomy Care Assessment and Management (repouching, etc.) PROCESS - Coordination of Care []  - 0 Simple Patient / Family Education for ongoing care []  - 0 Complex (extensive) Patient / Family Education for ongoing care []  - 0 Staff obtains Chiropractor, Records, T Results / Process Orders est []  - 0 Staff telephones HHA, Nursing Homes / Clarify orders / etc []  - 0 Routine Transfer to another Facility (non-emergent condition) []  - 0 Routine Hospital Admission (non-emergent condition) []  - 0 New Admissions / Manufacturing engineer / Ordering NPWT Apligraf, etc. , []  - 0 Emergency Hospital Admission (emergent condition) PROCESS - Special Needs []  - 0 Pediatric / Minor Patient Management []  - 0 Isolation Patient Management []  - 0 Hearing / Language / Visual special needs []  - 0 Assessment of Community assistance (transportation, D/C planning, etc.) []  - 0 Additional assistance / Altered mentation []  - 0 Support Surface(s) Assessment (bed, cushion, seat, etc.) INTERVENTIONS - Miscellaneous []  - 0 External ear exam []  - 0 Patient Transfer (multiple staff / Nurse, adult / Similar devices) []  - 0 Simple Staple / Suture removal (25 or less) []  - 0 Complex Staple / Suture removal (26 or more) []  - 0 Hypo/Hyperglycemic Management (do not check if billed separately) []  - 0 Ankle / Brachial Index (ABI) - do not check if billed separately Has the patient  been seen at the hospital within the last three years: Yes Total Score: 0 Level Of Care: ____ Electronic Signature(s) Signed: 09/14/2022 1:57:56 PM By:  Angelina Pih Entered By: Angelina Pih on 09/14/2022 09:37:37 -------------------------------------------------------------------------------- Encounter Discharge Information Details Patient Name: Date of Service: Robert Levine [facility] 09/14/2022 8:15 A M Medical Record Number: [id number] Patient Account Number: [id number] Date of Birth/Sex: Treating RN: 1955-06-04 (67 y.o. Robert Levine Primary Care Estelene Carmack: Ilsa Iha Other Clinician: Referring Swain Acree: Treating Bertrum Helmstetter/Extender: Roxine Caddy in Treatment: [address] Encounter Discharge Information Items Robert Levine, Robert Levine ([id number]) [id number] Page 3 of 8 Discharge Condition: Stable Ambulatory Status: Walker Discharge Destination: Home Transportation: Other Accompanied By: self Schedule Follow-up Appointment: Yes Clinical Summary of Care: Electronic Signature(s) Signed: 09/14/2022 1:54:08 PM By: Angelina Pih Previous Signature: 09/14/2022 1:53:28 PM Version By: Angelina Pih Entered By: Angelina Pih on 09/14/2022 13:54:08 -------------------------------------------------------------------------------- Lower Extremity Assessment Details Patient Name: Date of Service: Robert Levine [facility] 09/14/2022 8:15 A M Medical Record Number: [id number] Patient Account Number: [id number] Date of Birth/Sex: Treating RN: 02/19/1956 (67 y.o. Robert Levine Primary Care Kamalei Roeder: Ilsa Iha Other Clinician: Referring Bernadene Garside: Treating Jarae Nemmers/Extender: Roxine Caddy in Treatment: 11 Edema Assessment Assessed: [Left: No] [Right: No] Edema: [Left: Ye] [Right: s] Calf Left: Right: Point of Measurement: 40 cm From Medial Instep 43.2 cm Ankle Left: Right: Point of Measurement: 13 cm From Medial Instep 33  cm Vascular Assessment Pulses: Dorsalis Pedis Palpable: [Right:Yes] Electronic Signature(s) Signed: 09/14/2022 1:57:56 PM By: Angelina Pih Entered By: Angelina Pih on 09/14/2022 08:65:78 Robert Levine ([id number]) [id number] Page 4 of 8 -------------------------------------------------------------------------------- Multi Wound Chart Details Patient Name: Date of Service: Robert Levine [facility] 09/14/2022 8:15 A M Medical Record Number: [id number] Patient Account Number: [id number] Date of Birth/Sex: Treating RN: [birth year] (67 y.o. Robert Levine Primary Care Alyssa Mancera: Ilsa Iha Other Clinician: Referring Mariadelcarmen Corella: Treating Kyley Laurel/Extender: Roxine Caddy in Treatment: 11 Vital Signs Height(in): 77 Pulse(bpm): 77 Weight(lbs): 257 Blood Pressure(mmHg): 144/78 Body Mass Index(BMI): 30.5 Temperature(F): 97.8 Respiratory Rate(breaths/min): 18 [2:Photos:] [N/A:N/A] Right Amputation Site - Toe N/A N/A Wound Location: Surgical Injury N/A N/A Wounding Event: Diabetic Wound/Ulcer of the Lower N/A N/A Primary Etiology: Extremity Hypertension, Type II Diabetes N/A N/A Comorbid History: 06/03/2022 N/A N/A Date Acquired: 11 N/A N/A Weeks of Treatment: Open N/A N/A Wound Status: No N/A N/A Wound Recurrence: 1x1.2x0.2 N/A N/A Measurements L x W x D (cm) 0.942 N/A N/A A (cm) : rea 0.188 N/A N/A Volume (cm) : 70.40% N/A N/A % Reduction in A rea: 88.20% N/A N/A % Reduction in Volume: Grade 2 N/A N/A Classification: Medium N/A N/A Exudate A mount: Serosanguineous N/A N/A Exudate Type: red, brown N/A N/A Exudate Color: Small (1-33%) N/A N/A Granulation A mount: Pink, Pale N/A N/A Granulation Quality: Large (67-100%) N/A N/A Necrotic A mount: Fat Layer (Subcutaneous Tissue): Yes N/A N/A Exposed Structures: Fascia: No Tendon: No Muscle: No Joint: No Bone: No None N/A N/A Epithelialization: Treatment  Notes Electronic Signature(s) Signed: 09/14/2022 1:57:56 PM By: Angelina Pih Entered By: Angelina Pih on 09/14/2022 08:51:46 Robert Levine ([id number]) [id number] Page 5 of 8 -------------------------------------------------------------------------------- Multi-Disciplinary Care Plan Details Patient Name: Date of Service: Robert Levine [facility] 09/14/2022 8:15 A M Medical Record Number: [id number] Patient Account Number: [id number] Date of Birth/Sex: Treating RN: 1956-02-12 (67 y.o. Robert Levine Primary Care Ennio Houp: Ilsa Iha Other Clinician: Referring Jasaiah Karwowski: Treating Mike Hamre/Extender: Roxine Caddy in Treatment: 11 Active Inactive Wound/Skin Impairment Nursing Diagnoses: Impaired tissue integrity Knowledge deficit related to ulceration/compromised skin integrity Goals: Patient/caregiver will verbalize understanding of skin care regimen Date Initiated: 06/27/2022 Target Resolution Date: 10/05/2022  Goal Status: Active Ulcer/skin breakdown will have a volume reduction of 30% by week 4 Date Initiated: 06/27/2022 Date Inactivated: 08/08/2022 Target Resolution Date: 07/28/2022 Unmet Reason: comorbidities / non Goal Status: Unmet compliance Ulcer/skin breakdown will have a volume reduction of 50% by week 8 Date Initiated: 06/27/2022 Date Inactivated: 09/12/2022 Target Resolution Date: 08/27/2022 Unmet Reason: comorbidities / non Goal Status: Unmet compliance Ulcer/skin breakdown will have a volume reduction of 80% by week 12 Date Initiated: 06/27/2022 Target Resolution Date: 09/27/2022 Goal Status: Active Ulcer/skin breakdown will heal within 14 weeks Date Initiated: 06/27/2022 Target Resolution Date: 10/11/2022 Goal Status: Active Interventions: Assess patient/caregiver ability to obtain necessary supplies Assess patient/caregiver ability to perform ulcer/skin care regimen upon admission and as needed Assess ulceration(s)  every visit Provide education on ulcer and skin care Treatment Activities: Referred to DME Yamir Carignan for dressing supplies : 06/27/2022 Skin care regimen initiated : 06/27/2022 Notes: Electronic Signature(s) Signed: 09/14/2022 1:52:34 PM By: Angelina Pih Entered By: Angelina Pih on 09/14/2022 13:52:34 -------------------------------------------------------------------------------- Pain Assessment Details Patient Name: Date of Service: Robert Levine [facility] 09/14/2022 8:15 A M Medical Record Number: [id number] Patient Account Number: [id number] Date of Birth/Sex: Treating RN: 08/29/1955 (67 y.o. Robert Levine Primary Care Saraiya Kozma: Ilsa Iha Other Clinician: Referring Keano Guggenheim: Treating Adlynn Lowenstein/Extender: Roxine Caddy in Treatment: [address] [place] ([id number]) [id number] Page 6 of 8 Active Problems Location of Pain Severity and Description of Pain Patient Has Paino No Site Locations Rate the pain. Current Pain Level: 0 Pain Management and Medication Current Pain Management: Electronic Signature(s) Signed: 09/14/2022 1:57:56 PM By: Angelina Pih Entered By: Angelina Pih on 09/14/2022 08:10:04 -------------------------------------------------------------------------------- Patient/Caregiver Education Details Patient Name: Date of Service: Robert Levine, Robert Levine 6/14/2024andnbsp8:15 A M Medical Record Number: [id number] Patient Account Number: [id number] Date of Birth/Gender: Treating RN: September 03, 1955 (67 y.o. Robert Levine Primary Care Physician: Ilsa Iha Other Clinician: Referring Physician: Treating Physician/Extender: Roxine Caddy in Treatment: 11 Education Assessment Education Provided To: Patient Education Topics Provided Offloading: Handouts: How Offloading Helps Foot Wounds Heal, Other: TCC applied Methods: Explain/Verbal Responses: State content correctly Wound/Skin  Impairment: Handouts: Caring for Your Ulcer Methods: Explain/Verbal Responses: State content correctly Robert Levine ([id number]) [id number] Page 7 of 8 Electronic Signature(s) Signed: 09/14/2022 1:57:56 PM By: Angelina Pih Entered By: Angelina Pih on 09/14/2022 13:52:55 -------------------------------------------------------------------------------- Wound Assessment Details Patient Name: Date of Service: Robert Levine [facility] 09/14/2022 8:15 A M Medical Record Number: [id number] Patient Account Number: [id number] Date of Birth/Sex: Treating RN: March 19, 1956 (67 y.o. 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 Wound Status: Open Wounding Event: Surgical Injury Comorbid History: Hypertension, Type II Diabetes Date Acquired: 06/03/2022 Weeks Of Treatment: 11 Clustered Wound: No Photos Wound Measurements Length: (cm) 1 Width: (cm) 1.2 Depth: (cm) 0.2 Area: (cm) 0.942 Volume: (cm) 0.188 % Reduction in Area: 70.4% % Reduction in Volume: 88.2% Epithelialization: None Tunneling: No Undermining: No Wound Description Classification: Grade 2 Exudate Amount: Medium Exudate Type: Serosanguineous Exudate Color: red, brown Foul Odor After Cleansing: No Slough/Fibrino Yes Wound Bed Granulation Amount: Small (1-33%) Exposed Structure Granulation Quality: Pink, Pale Fascia Exposed: No Necrotic Amount: Large (67-100%) Fat Layer (Subcutaneous Tissue) Exposed: Yes Necrotic Quality: Adherent Slough Tendon Exposed: No Muscle Exposed: No Joint Exposed: No Bone Exposed: No Treatment Notes Robert Levine, Robert Levine ([id number]) [id number] Page  8 of 8 Wound #2 (Amputation Site - Toe)  Wound Laterality: Right Cleanser Soap and Water Discharge Instruction: Gently cleanse wound with antibacterial soap, rinse and pat dry prior to dressing wounds Peri-Wound Care Topical Primary Dressing Hydrofera Blue Ready Transfer Foam, 2.5x2.5 (in/in) Discharge Instruction: Apply Hydrofera Blue Ready to wound bed as directed Secondary Dressing Gauze Secured With Medipore T - 79M Medipore H Soft Cloth Surgical T ape ape, 2x2 (in/yd) Kerlix Roll Sterile or Non-Sterile 6-ply 4.5x4 (yd/yd) Discharge Instruction: Apply Kerlix as directed foam cup Compression Wrap Compression Stockings Add-Ons Electronic Signature(s) Signed: 09/14/2022 1:57:56 PM By: Angelina Pih Entered By: Angelina Pih on 09/14/2022 08:25:56 -------------------------------------------------------------------------------- Vitals Details Patient Name: Date of Service: Robert Levine, Robert Levine 09/14/2022 8:15 A M Medical Record Number: [id number] Patient Account Number: [id number] Date of Birth/Sex: Treating RN: 09-Mar-1956 (67 y.o. Robert Levine Primary Care Thedford Bunton: Ilsa Iha Other Clinician: Referring [place] Boyett: Treating Leanette Eutsler/Extender: Roxine Caddy in Treatment: 11 Vital Signs Time Taken: 08:08 Temperature (F): 97.8 Height (in): 77 Pulse (bpm): 77 Weight (lbs): 257 Respiratory Rate (breaths/min): 18 Body Mass Index (BMI): 30.5 Blood Pressure (mmHg): 144/78 Reference Range: 80 - 120 mg / dl Electronic Signature(s) Signed: 09/14/2022 1:57:56 PM By: Angelina Pih Entered By: Angelina Pih on 09/14/2022 08:09:58

## 2022-09-20 ENCOUNTER — Encounter: Payer: Medicare Other | Admitting: Physician Assistant

## 2022-09-20 DIAGNOSIS — E11621 Type 2 diabetes mellitus with foot ulcer: Secondary | ICD-10-CM | POA: Diagnosis not present

## 2022-09-20 NOTE — Progress Notes (Addendum)
ASAH, LAMAY ([id number]) [id number] Page 1 of 9 Visit Report for 09/20/2022 Chief Complaint Document Details Patient Name: Date of Service: Robert Levine [facility] 09/20/2022 10:45 A M Medical Record Number: [id number] Patient Account Number: [id number] Date of Birth/Sex: Treating RN: 1956-03-24 (67 y.o. Robert Levine Primary Care Provider: Ilsa Levine Other Clinician: Betha Levine Referring Provider: Treating Provider/Extender: Robert Levine in Treatment: 12 Information Obtained from: Patient Chief Complaint 06/27/2022; Right great toe ulcer at amputation site Electronic Signature(s) Signed: 09/20/2022 10:36:09 AM By: Robert Derry PA-C Entered By: Robert Levine on 09/20/2022 10:36:08 -------------------------------------------------------------------------------- Debridement Details Patient Name: Date of Service: Robert Levine, Robert Levine 09/20/2022 10:45 A M Medical Record Number: [id number] Patient Account Number: [id number] Date of Birth/Sex: Treating RN: 1955-06-08 (67 y.o. Robert Levine Primary Care Provider: Ilsa Levine Other Clinician: Betha Levine Referring Provider: Treating Provider/Extender: Robert Levine in Treatment: 12 Debridement Performed for Assessment: Wound #2 Right Amputation Site - Toe Performed By: Physician Robert Derry, PA-C Debridement Type: Debridement Severity of Tissue Pre Debridement: Fat layer exposed Level of Consciousness (Pre-procedure): Awake and Alert Pre-procedure Verification/Time Out Yes - 11:06 Taken: Start Time: 11:06 Percent of Wound Bed Debrided: 100% T Area Debrided (cm): otal 0.16 Tissue and other material debrided: Viable, Non-Viable, Callus, Slough, Subcutaneous, Slough Level: Skin/Subcutaneous Tissue Debridement Description: Excisional Instrument: Curette Bleeding: Minimum Hemostasis Achieved: Pressure Response to Treatment: Procedure was tolerated well Level  of Consciousness (Post- Awake and Alert procedure): Robert Levine, Robert Levine ([id number]) [id number] Page 2 of 9 Post Debridement Measurements of Total Wound Length: (cm) 0.4 Width: (cm) 0.5 Depth: (cm) 0.2 Volume: (cm) 0.031 Character of Wound/Ulcer Post Debridement: Improved Severity of Tissue Post Debridement: Fat layer exposed Post Procedure Diagnosis Same as Pre-procedure Electronic Signature(s) Signed: 09/20/2022 4:16:40 PM By: Robert Levine Signed: 09/20/2022 5:12:55 PM By: Robert Levine Signed: 09/20/2022 5:52:14 PM By: Robert Derry PA-C Entered By: Robert Levine on 09/20/2022 11:13:48 -------------------------------------------------------------------------------- HPI Details Patient Name: Date of Service: Robert Levine, Robert Levine 09/20/2022 10:45 A M Medical Record Number: [id number] Patient Account Number: [id number] Date of Birth/Sex: Treating RN: 1955-11-23 (67 y.o. Robert Levine Primary Care Provider: Ilsa Levine Other Clinician: Betha Levine Referring Provider: Treating Provider/Extender: Robert Levine in Treatment: 12 History of Present Illness HPI Description: 01-15-2022 patient presents today as a referral from Robert Levine who is a local podiatrist due to a foot wound that is not getting better. The patient unfortunately has had this going on for quite some time he is not necessarily the best historian which makes it a little bit difficult but nonetheless I did review notes as well together where things stand what is going on right now has been using Vaseline over the area according to what he tells me he has had debridements with Dr. Excell Levine intermittently. With that being said the patient has not really been using his offloading shoe as much she does have diabetic shoes and he tells me that is primarily what has been wearing. 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 He also has high blood pressure. 01-25-2022 upon evaluation today patient's wound actually showing signs of improvement though there is still little bit of callus and some biofilm and slough buildup on the surface of the wound. I Robert Levine perform sharp debridement to clear this away today. He tells me has been using  the shoe a lot of the time though sometimes when he is at the assisted living facility he just walks around with a sock not using the shoe. I explained that he needs to make sure to always have the shoe on when he is up and moving around as can help this to heal most effectively. 02-01-2022 upon evaluation today patient appears to be doing well currently in regard to his wound from the standpoint of this not getting worse it does not look to be infected. With that being said unfortunately he does have an open wound that is going to require some sharp debridement today and again I am concerned that if we do not get this closed he is going to end up with this worsening in general. He has not really been wearing the postop shoe with front offloading unfortunately which means that he is not really getting much better. I think that a total contact cast is probably going to be the method in which to try to get this healed as quickly as possible. Again the sooner we get healed lessen the chance that this will develop into a more significant ulceration requiring any type of surgery. We especially would avoid any chance for amputation. 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 Here for application of TCC #1 11/9; obligatory first total contact cast change. The patient had no particular problems. TCC reapplied 02-15-2022 upon evaluation today patient appears to be doing well currently in regard to his wound. There is some callus however buildup that is going to need to be cleared away.  Fortunately there does not appear to be any signs of infection this is actually his third cast application today although the first 2 were last week and this is already quite a bit smaller. 02-19-2022 upon evaluation today patient's wound actually is showing signs of doing well with the cast he is actually making good progress. Fortunately I do not see any evidence of infection locally or systemically at this time which is great news overall I think he is on the right track. 02-26-2022 upon evaluation today patient actually appears to be completely healed which is great news. Fortunately I see no evidence of active infection at this time locally or systemically which is great news as well. 06/27/2022 Robert Levine is a 67 year old male with a past medical history of uncontrolled insulin-dependent type 2 diabetes, previous amputation to the right great Robert Levine, LOS ([id number]) [id number] Page 3 of 9 toe and stage III chronic kidney disease that presents the clinic for a 1 month history of nonhealing ulcer to the previous right great toe amputation site. He has been treated in our clinic for this issue previously with a total contact cast. He has a podiatrist. He resides in a nursing facility. They have recommended he come in for his wound care. He is fairly unaware of the wound and its condition. 4/10; patient presents for follow-up. He has been using Hydrofera Blue to the wound bed along with his front offloading shoe on the right foot. He has taken his oral antibiotics without issues. 4/24; patient presents for follow-up. He has been using Hydrofera Blue to the wound bed. He has stopped using his front offloading shoe because he thinks it is not helping. He has no issues or complaints today. He would like to follow-up every 2 weeks. 5/8; this is a patient with a wound at the amp site of the right great toe. There is a small horizontal linear  wound but surrounded  with thick buildup of callus. He is intermittently using his forefoot offloading boot but even with this he probably is not walking in this properly not putting the weight back on his heel I went over this with him. If this does not work he probably needs a total contact cast and I think he has had 1 of these in the past. Not sure if there is an issue We have been using Hydrofera Blue as the primary dressing 08-16-2022 this is a gentleman that I have previously seen in the clinic although the patient tells me that this is not doing too poorly in fact he feels like that it is doing better. He does have home health coming out. With that being said I do not see any signs of active infection locally nor systemically at this time which is great news. He does have a tremendous amount of callus however there is no need to be managed. 6/1; we did not have a total contact cast to apply and I think the wound on the plantar right first metatarsal head was about the same. He is using a forefoot off loader and dressing with Hydrofera Blue. Unfortunately he has significant callus around the wound area 5/29; patient presents for follow-up. He has been using Hydrofera Blue to the wound bed. The wound has declined in appearance. We discussed a total contact cast and patient was agreeable to move forward with this at next clinic visit. 6/12; patient presents for follow-up. He is ready for the total contact cast placement today. Wound is overall stable from last visit. No signs of infection. 09-14-2022 upon evaluation today patient appears to be doing well currently in regard to his wound after 2 days he is already showing signs of improvement which is great news. Fortunately I do not see any signs of active infection locally nor systemically at this time. 09-20-2022 upon evaluation patient's wound bed actually showed signs of good granulation epithelization this is significantly smaller he still has a lot of callus around  the edges of the wound and will perform debridement to clear this away. Electronic Signature(s) Signed: 09/20/2022 12:25:53 PM By: Robert Derry PA-C Entered By: Robert Levine on 09/20/2022 12:25:53 -------------------------------------------------------------------------------- Physical Exam Details Patient Name: Date of Service: Robert Levine [facility] 09/20/2022 10:45 A M Medical Record Number: [id number] Patient Account Number: [id number] Date of Birth/Sex: Treating RN: 06-06-1955 (67 y.o. Robert Levine Primary Care Provider: Ilsa Levine Other Clinician: Betha Levine Referring Provider: Treating Provider/Extender: Robert Levine in Treatment: 12 Constitutional Well-nourished and well-hydrated in no acute distress. Respiratory normal breathing without difficulty. Psychiatric this patient is able to make decisions and demonstrates good insight into disease process. Alert and Oriented x 3. pleasant and cooperative. Notes Patient's wound bed was cleaned as well as the surrounding callus and postdebridement this actually looks to be doing much better which is great news. Overall I do not see any signs of active infection locally or systemically which is also great news. Electronic Signature(s) Signed: 09/20/2022 12:26:13 PM By: Robert Derry PA-C Entered By: Robert Levine on 09/20/2022 12:26:12 Robert Levine ([id number]) [id number] Page 4 of 9 -------------------------------------------------------------------------------- Physician Orders Details Patient Name: Date of Service: Robert Levine [facility] 09/20/2022 10:45 A M Medical Record Number: [id number] Patient Account Number: [id number] Date of Birth/Sex: Treating RN: [birth year] (67 y.o. 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 Nurse Visit as needed Home Health Home Health Company: - [facility] for wound care. May utilize formulary equivalent dressing for wound treatment orders unless otherwise specified. Home Health Nurse may visit PRN to address patients wound care needs. - place on hold , patient wearing a cast,[facility] [phone] Scheduled days for dressing changes to be completed; exception, patient has scheduled wound care visit that day. **Please direct any NON-WOUND related issues/requests for orders to patient's Primary Care Physician. **If current dressing causes regression in wound condition, may D/C ordered dressing product/s and apply Normal Saline Moist Dressing daily until next Wound Healing Levine or Other MD appointment. **Notify Wound Healing Levine of regression in wound condition at [phone]. Bathing/ [employer] wounds with antibacterial soap and water. Anesthetic (Use 'Patient Medications' Section for Anesthetic Order Entry) Lidocaine applied to wound bed Edema Control - Lymphedema / Segmental Compressive Device / Other Elevate, Exercise Daily and A void Standing for Long Periods of Time. Elevate leg(s) parallel to the floor when sitting. DO YOUR BEST to sleep in the bed at night. DO NOT sleep in your recliner. Long hours of sitting in a recliner leads to swelling of the legs and/or potential wounds on your backside. Off-Loading Total Contact Cast to Right Lower Extremity Wound Treatment Wound #2 - Amputation Site - Toe Wound  Laterality: Right Cleanser: Soap and Water 1 x Per Day/30 Days Discharge Instructions: Gently cleanse wound with antibacterial soap, rinse and pat dry prior to dressing wounds Prim Dressing: Hydrofera Blue Ready Transfer Foam, 2.5x2.5 (in/in) (Dispense As Written) 1 x Per Day/30 Days ary Discharge Instructions: Apply Hydrofera Blue Ready to wound bed as directed Secondary Dressing: Gauze 1 x Per Day/30 Days Secured With: Medipore T - 28M Medipore H Soft Cloth Surgical T ape ape, 2x2 (in/yd) 1 x Per Day/30 Days Secured With: [employer] Sterile or Non-Sterile 6-ply 4.5x4 (yd/yd) 1 x Per Day/30 Days Discharge Instructions: Apply Kerlix as directed Secured With: foam cup 1 x Per Day/30 Days Robert Levine, Robert Levine ([id number]) [id number] Page 5 of 9 Electronic Signature(s) Signed: 09/20/2022 5:12:55 PM By: Robert Levine Signed: 09/20/2022 5:52:14 PM By: Robert Derry PA-C Entered By: Robert Levine on 09/20/2022 11:14:25 -------------------------------------------------------------------------------- Problem List Details Patient Name: Date of Service: Robert Levine, Robert Levine 09/20/2022 10:45 A M Medical Record Number: [id number] Patient Account Number: [id number] Date of Birth/Sex: Treating RN: 07-Mar-1956 (67 y.o. 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 06/27/2022 No Yes N18.30 Chronic kidney disease, stage 3 unspecified 06/27/2022 No Yes I10 Essential (primary) hypertension 06/27/2022 No Yes Inactive Problems Resolved Problems Electronic  Signature(s) Signed: 09/20/2022 10:36:02 AM By: Robert Derry PA-C Entered By: Robert Levine on 09/20/2022 10:36:02 Robert Levine ([id number]) [id number] Page 6 of 9 -------------------------------------------------------------------------------- Progress Note Details Patient  Name: Date of Service: Robert Levine [facility] 09/20/2022 10:45 A M Medical Record Number: [id number] Patient Account Number: [id number] Date of Birth/Sex: Treating RN: Feb 10, 1956 (67 y.o. Robert Levine Primary Care Provider: Ilsa Levine Other Clinician: Betha Levine Referring Provider: Treating Provider/Extender: Robert Levine in Treatment: 12 Subjective Chief Complaint Information obtained from Patient 06/27/2022; Right great toe ulcer at amputation site History of Present Illness (HPI) 01-15-2022 patient presents today as a referral from Robert Levine who is a local podiatrist due to a foot wound that is not getting better. The patient unfortunately has had this going on for quite some time he is not necessarily the best historian which makes it a little bit difficult but nonetheless I did review notes as well together where things stand what is going on right now has been using Vaseline over the area according to what he tells me he has had debridements with Dr. Excell Levine intermittently. With that being said the patient has not really been using his offloading shoe as much she does have diabetic shoes and he tells me that is primarily what has been wearing. 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 He also has high blood pressure. 01-25-2022 upon evaluation today patient's wound actually showing signs of improvement though there is still little bit of callus and some biofilm and slough buildup on the surface of the  wound. I Robert Levine perform sharp debridement to clear this away today. He tells me has been using the shoe a lot of the time though sometimes when he is at the assisted living facility he just walks around with a sock not using the shoe. I explained that he needs to make sure to always have the shoe on when he is up and moving around as can help this to heal most effectively. 02-01-2022 upon evaluation today patient appears to be doing well currently in regard to his wound from the standpoint of this not getting worse it does not look to be infected. With that being said unfortunately he does have an open wound that is going to require some sharp debridement today and again I am concerned that if we do not get this closed he is going to end up with this worsening in general. He has not really been wearing the postop shoe with front offloading unfortunately which means that he is not really getting much better. I think that a total contact cast is probably going to be the method in which to try to get this healed as quickly as possible. Again the sooner we get healed lessen the chance that this will develop into a more significant ulceration requiring any type of surgery. We especially would avoid any chance for amputation. 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 Here for application of TCC #1 11/9; obligatory first total contact cast change. The patient had no particular problems. TCC reapplied 02-15-2022 upon evaluation today patient appears to be doing well currently in regard to his wound. There is some callus however buildup that is going to need to be cleared away. Fortunately there does not appear to be any signs of infection this is actually his third cast application today although the first 2 were last week and this is already quite a bit smaller. 02-19-2022 upon evaluation today patient's wound actually is showing signs of doing well  with the cast he is actually making good progress.  Fortunately I do not see any evidence of infection locally or systemically at this time which is great news overall I think he is on the right track. 02-26-2022 upon evaluation today patient actually appears to be completely healed which is great news. Fortunately I see no evidence of active infection at this time locally or systemically which is great news as well. 06/27/2022 Mr. Shloima Clinch is a 67 year old male with a past medical history of uncontrolled insulin-dependent type 2 diabetes, previous amputation to the right great toe and stage III chronic kidney disease that presents the clinic for a 1 month history of nonhealing ulcer to the previous right great toe amputation site. He has been treated in our clinic for this issue previously with a total contact cast. He has a podiatrist. He resides in a nursing facility. They have recommended he come in for his wound care. He is fairly unaware of the wound and its condition. 4/10; patient presents for follow-up. He has been using Hydrofera Blue to the wound bed along with his front offloading shoe on the right foot. He has taken his oral antibiotics without issues. 4/24; patient presents for follow-up. He has been using Hydrofera Blue to the wound bed. He has stopped using his front offloading shoe because he thinks it is not helping. He has no issues or complaints today. He would like to follow-up every 2 weeks. 5/8; this is a patient with a wound at the amp site of the right great toe. There is a small horizontal linear wound but surrounded with thick buildup of callus. He is intermittently using his forefoot offloading boot but even with this he probably is not walking in this properly not putting the weight back on his heel I went over this with him. If this does not work he probably needs a total contact cast and I think he has had 1 of these in the past. Not sure if there is an issue We have been using Hydrofera Blue as the primary  dressing 08-16-2022 this is a gentleman that I have previously seen in the clinic although the patient tells me that this is not doing too poorly in fact he feels like that it is doing better. He does have home health coming out. With that being said I do not see any signs of active infection locally nor systemically at this time which is great news. He does have a tremendous amount of callus however there is no need to be managed. Robert Levine, Robert Levine ([id number]) [id number] Page 7 of 9 6/1; we did not have a total contact cast to apply and I think the wound on the plantar right first metatarsal head was about the same. He is using a forefoot off loader and dressing with Hydrofera Blue. Unfortunately he has significant callus around the wound area 5/29; patient presents for follow-up. He has been using Hydrofera Blue to the wound bed. The wound has declined in appearance. We discussed a total contact cast and patient was agreeable to move forward with this at next clinic visit. 6/12; patient presents for follow-up. He is ready for the total contact cast placement today. Wound is overall stable from last visit. No signs of infection. 09-14-2022 upon evaluation today patient appears to be doing well currently in regard to his wound after 2 days he is already showing signs of improvement which is great news. Fortunately I do not see any signs of active infection locally  nor systemically at this time. 09-20-2022 upon evaluation patient's wound bed actually showed signs of good granulation epithelization this is significantly smaller he still has a lot of callus around the edges of the wound and will perform debridement to clear this away. Objective Constitutional Well-nourished and well-hydrated in no acute distress. Vitals Time Taken: 10:43 AM, Height: 77 in, Weight: 257 lbs, BMI: 30.5, Temperature: 97.8 F, Pulse: 73 bpm, Respiratory Rate: 18 breaths/min, Blood Pressure: 155/82  mmHg. Respiratory normal breathing without difficulty. Psychiatric this patient is able to make decisions and demonstrates good insight into disease process. Alert and Oriented x 3. pleasant and cooperative. General Notes: Patient's wound bed was cleaned as well as the surrounding callus and postdebridement this actually looks to be doing much better which is great news. Overall I do not see any signs of active infection locally or systemically which is also great news. Integumentary (Hair, Skin) Wound #2 status is Open. Original cause of wound was Surgical Injury. The date acquired was: 06/03/2022. The wound has been in treatment 12 weeks. The wound is located on the Right Amputation Site - T The wound measures 0.4cm length x 0.5cm width x 0.2cm depth; 0.157cm^2 area and 0.031cm^3 volume. oe. There is Fat Layer (Subcutaneous Tissue) exposed. There is a medium amount of serosanguineous drainage noted. There is small (1-33%) pink, pale granulation within the wound bed. There is a large (67-100%) amount of necrotic tissue within the wound bed including Adherent Slough. 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 There was a Excisional Skin/Subcutaneous Tissue Debridement with a total area of 0.16 sq cm performed by Robert Derry, PA-C. With the following instrument(s): Curette to remove Viable and Non-Viable tissue/material. Material removed includes Callus, Subcutaneous Tissue, and Slough. A time out was conducted at 11:06, prior to the start of the procedure. A Minimum  amount of bleeding was controlled with Pressure. The procedure was tolerated well. Post Debridement Measurements: 0.4cm length x 0.5cm width x 0.2cm depth; 0.031cm^3 volume. Character of Wound/Ulcer Post Debridement is improved. Severity of Tissue Post Debridement is: Fat layer exposed. 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 There was a T Contact oe otal Cast Procedure by Robert Derry, PA-C. Post procedure Diagnosis Wound #2: Same as Pre-Procedure Plan Robert Levine, Robert Levine ([id number]) [id number] Page 8 of 9 Follow-up Appointments: Return Appointment in 1 week. Nurse Visit as needed Home Health: Home Health Company: - [facility] for wound care. May utilize formulary equivalent dressing for wound treatment orders unless otherwise specified. Home Health Nurse may visit PRN to address patients wound care needs. - place on hold , patient wearing a cast,[facility] [phone] Scheduled days for dressing changes to be completed; exception, patient has scheduled wound care visit that day. **Please direct any NON-WOUND related issues/requests for orders to patient's Primary Care Physician. **If current dressing causes regression in wound condition, may D/C ordered dressing product/s and apply Normal Saline Moist Dressing daily until next Wound Healing Levine or Other MD appointment. **Notify  Wound Healing Levine of regression in wound condition at [phone]. Bathing/ Shower/ Hygiene: Wash wounds with antibacterial soap and water. Anesthetic (Use 'Patient Medications' Section for Anesthetic Order Entry): Lidocaine applied to wound bed Edema Control - Lymphedema / Segmental Compressive Device / Other: Elevate, Exercise Daily and Avoid Standing for Long Periods of Time. Elevate leg(s) parallel to the floor when  sitting. DO YOUR BEST to sleep in the bed at night. DO NOT sleep in your recliner. Long hours of sitting in a recliner leads to swelling of the legs and/or potential wounds on your backside. Off-Loading: T Contact Cast to Right Lower Extremity otal WOUND #2: - Amputation Site - T oe Wound Laterality: Right Cleanser: Soap and Water 1 x Per Day/30 Days Discharge Instructions: Gently cleanse wound with antibacterial soap, rinse and pat dry prior to dressing wounds Prim Dressing: Hydrofera Blue Ready Transfer Foam, 2.5x2.5 (in/in) (Dispense As Written) 1 x Per Day/30 Days ary Discharge Instructions: Apply Hydrofera Blue Ready to wound bed as directed Secondary Dressing: Gauze 1 x Per Day/30 Days Secured With: Medipore T - 38M Medipore H Soft Cloth Surgical T ape ape, 2x2 (in/yd) 1 x Per Day/30 Days Secured With: [employer] Sterile or Non-Sterile 6-ply 4.5x4 (yd/yd) 1 x Per Day/30 Days Discharge Instructions: Apply Kerlix as directed Secured With: foam cup 1 x Per Day/30 Days 1. I would recommend that we have the patient continue to monitor for any evidence of infection or worsening. Overall I do believe that we are making good headway towards complete closure and I think that in general the total contact casting is working. Therefore after the good debridement today we are going to continue with total contact cast I did reapply that today as well and he has continued to tolerate this quite well. 2. I am going to recommend that the [facility] be continued as this seems to be doing excellent for him. We will see patient back for reevaluation in 1 week here in the clinic. If anything worsens or changes patient will contact our office for additional recommendations. Electronic Signature(s) Signed: 09/20/2022 12:26:44 PM By: Robert Derry PA-C Entered By: Robert Levine on 09/20/2022 12:26:44 -------------------------------------------------------------------------------- Total Contact Cast  Details Patient Name: Date of Service: Robert Levine [facility] 09/20/2022 10:45 A M Medical Record Number: [id number] Patient Account Number: [id number] Date of Birth/Sex: Treating RN: 1956-03-10 (67 y.o. Robert Levine Primary Care Provider: Ilsa Levine Other Clinician: Betha Levine Referring Provider: Treating Provider/Extender: Robert Levine in Treatment: 12 T Contact Cast Applied for Wound Assessment: otal Wound #2 Right Amputation Site - Toe Performed By: Physician Robert Derry, PA-C Post Procedure Diagnosis Same as Pre-procedure Electronic Signature(s) Signed: 09/20/2022 5:12:55 PM By: Robert Levine Signed: 09/20/2022 5:52:14 PM By: Robert Derry PA-C Entered By: Robert Levine on 09/20/2022 11:14:03 Robert Levine ([id number]) [id number] Page 9 of 9 -------------------------------------------------------------------------------- SuperBill Details Patient Name: Date of Service: Robert Levine [facility] 09/20/2022 Medical Record Number: [id number] Patient Account Number: [id number] Date of Birth/Sex: Treating RN: 1955/10/05 (67 y.o. Robert Levine Primary Care Provider: Ilsa Levine Other Clinician: Betha Levine Referring Provider: Treating Provider/Extender: Robert Levine in Treatment: 12 Diagnosis Coding ICD-10 Codes Code Description E11.621 Type 2 diabetes mellitus with foot ulcer L97.512 Non-pressure chronic ulcer of other part of right foot with fat layer exposed Z89.411 Acquired absence of right great toe N18.30 Chronic kidney disease, stage 3 unspecified I10 Essential (primary) hypertension Facility Procedures : CPT4 Code: [id number] Description: 11042 - DEB SUBQ  TISSUE 20 SQ CM/< ICD-10 Diagnosis Description L97.512 Non-pressure chronic ulcer of other part of right foot with fat layer exposed Modifier: Quantity: 1 Physician Procedures : CPT4 Code Description Modifier [id number] 11042 - WC PHYS SUBQ  TISS 20 SQ CM ICD-10 Diagnosis Description L97.512 Non-pressure chronic ulcer of other part of right foot with fat layer exposed Quantity: 1 Electronic Signature(s) Signed: 09/20/2022 12:28:01 PM By: Robert Derry PA-C Entered By: Robert Levine on 09/20/2022 12:28:01

## 2022-09-20 NOTE — Progress Notes (Addendum)
Robert, Levine ([id number]) [id number] Page 1 of 9 Visit Report for 09/20/2022 Arrival Information Details Patient Name: Date of Service: Robert Levine [facility] 09/20/2022 10:45 A M Medical Record Number: [id number] Patient Account Number: [id number] Date of Birth/Sex: Treating RN: 1956/02/11 (67 y.o. Robert Levine Primary Care Jamine Wingate: Ilsa Iha Other Clinician: Betha Loa Referring Lorena Benham: Treating Murry Khiev/Extender: Roxine Caddy in Treatment: 12 Visit Information History Since Last Visit All ordered tests and consults were completed: No Patient Arrived: [name] Added or deleted any medications: No Arrival Time: 10:38 Any new allergies or adverse reactions: No Transfer Assistance: None Had a fall or experienced change in No Patient Identification Verified: Yes activities of daily living that may affect Secondary Verification Process Completed: Yes risk of falls: Patient Requires Transmission-Based Precautions: No Signs or symptoms of abuse/neglect since last visito No Patient Has Alerts: Yes Hospitalized since last visit: No Patient Alerts: DM II Implantable device outside of the clinic excluding No cellular tissue based products placed in the center since last visit: Has Dressing in Place as Prescribed: Yes Has Footwear/Offloading in Place as Prescribed: Yes Right: T Contact Cast otal Pain Present Now: No Electronic Signature(s) Signed: 09/20/2022 5:12:55 PM By: Betha Loa Entered By: Betha Loa on 09/20/2022 10:42:45 -------------------------------------------------------------------------------- Clinic Level of Care Assessment Details Patient Name: Date of Service: Robert Levine [facility] 09/20/2022 10:45 A M Medical Record Number: [id number] Patient Account Number: [id number] Date of Birth/Sex: Treating RN: 19-Nov-1955 (67 y.o. Robert Levine Primary Care Jule Whitsel: Ilsa Iha Other Clinician: Betha Loa Referring Tripp Goins: Treating Desi Carby/Extender: Roxine Caddy in Treatment: 12 Clinic Level of Care Assessment Items TOOL 1 Quantity Score []  - 0 Use when EandM and Procedure is performed on INITIAL visit ASSESSMENTS - Nursing Assessment / Reassessment []  - 0 General Physical Exam (combine w/ comprehensive assessment (listed just below) when performed on new pt. [address], Levine ([id number]) [id number] Page 2 of 9 []  - 0 Comprehensive Assessment (HX, ROS, Risk Assessments, Wounds Hx, etc.) ASSESSMENTS - Wound and Skin Assessment / Reassessment []  - 0 Dermatologic / Skin Assessment (not related to wound area) ASSESSMENTS - Ostomy and/or Continence Assessment and Care []  - 0 Incontinence Assessment and Management []  - 0 Ostomy Care Assessment and Management (repouching, etc.) PROCESS - Coordination of Care []  - 0 Simple Patient / Family Education for ongoing care []  - 0 Complex (extensive) Patient / Family Education for ongoing care []  - 0 Staff obtains Chiropractor, Records, T Results / Process Orders est []  - 0 Staff telephones HHA, Nursing Homes / Clarify orders / etc []  - 0 Routine Transfer to another Facility (non-emergent condition) []  - 0 Routine Hospital Admission (non-emergent condition) []  - 0 New Admissions / Manufacturing engineer / Ordering NPWT Apligraf, etc. , []  - 0 Emergency Hospital Admission (emergent condition) PROCESS - Special Needs []  - 0 Pediatric / Minor Patient Management []  - 0 Isolation Patient Management []  - 0 Hearing / Language / Visual special needs []  - 0 Assessment of Community assistance (transportation, D/C planning, etc.) []  - 0 Additional assistance / Altered mentation []  - 0 Support Surface(s) Assessment (bed, cushion, seat, etc.) INTERVENTIONS - Miscellaneous []  - 0 External ear exam []  - 0 Patient Transfer (multiple staff / Nurse, adult / Similar devices) []  -  0 Simple Staple / Suture removal (25 or less) []  - 0 Complex Staple / Suture removal (26 or more) []  - 0 Hypo/Hyperglycemic Management (do not check if billed separately) []  - 0  Ankle / Brachial Index (ABI) - do not check if billed separately Has the patient been seen at the hospital within the last three years: Yes Total Score: 0 Level Of Care: ____ Electronic Signature(s) Signed: 09/20/2022 5:12:55 PM By: Betha Loa Entered By: Betha Loa on 09/20/2022 11:14:31 -------------------------------------------------------------------------------- Encounter Discharge Information Details Patient Name: Date of Service: Robert Levine, [name] 09/20/2022 10:45 A M Medical Record Number: [id number] Patient Account Number: [id number] Date of Birth/Sex: Treating RN: 06-May-1955 (67 y.o. Robert Levine [place] ([id number]) [id number] Page 3 of 9 Primary Care Christo Hain: Ilsa Iha Other Clinician: Betha Loa Referring Lyndsy Gilberto: Treating Keith Cancio/Extender: Roxine Caddy in Treatment: 12 Encounter Discharge Information Items Post Procedure Vitals Discharge Condition: Stable Temperature (F): 97.8 Ambulatory Status: Walker Pulse (bpm): 73 Discharge Destination: Home Respiratory Rate (breaths/min): 18 Transportation: Other Blood Pressure (mmHg): 155/82 Accompanied By: self Schedule Follow-up Appointment: Yes Clinical Summary of Care: Electronic Signature(s) Signed: 09/20/2022 5:12:55 PM By: Betha Loa Entered By: Betha Loa on 09/20/2022 11:15:58 -------------------------------------------------------------------------------- Lower Extremity Assessment Details Patient Name: Date of Service: Robert Levine [facility] 09/20/2022 10:45 A M Medical Record Number: [id number] Patient Account Number: [id number] Date of Birth/Sex: Treating RN: [birth year] (67 y.o. Robert Levine Primary Care Robert Levine: Ilsa Iha Other Clinician:  Betha Loa Referring Eugenie Harewood: Treating Beaux Wedemeyer/Extender: Vicente Serene Weeks in Treatment: 12 Edema Assessment Assessed: [Left: No] [Right: Yes] [Left: Edema] [Right: :] Calf Left: Right: Point of Measurement: 40 cm From Medial Instep 40.5 cm Ankle Left: Right: Point of Measurement: 13 cm From Medial Instep 31.6 cm Vascular Assessment Pulses: Dorsalis Pedis Palpable: [Right:Yes] Electronic Signature(s) Signed: 09/20/2022 4:16:40 PM By: Angelina Pih Signed: 09/20/2022 5:12:55 PM By: Betha Loa Entered By: Betha Loa on 09/20/2022 10:55:40 [name] ([id number]) [id number] Page 4 of 9 -------------------------------------------------------------------------------- Multi Wound Chart Details Patient Name: Date of Service: Robert Levine [facility] 09/20/2022 10:45 A M Medical Record Number: [id number] Patient Account Number: [id number] Date of Birth/Sex: Treating RN: Aug 25, 1955 (67 y.o. Robert Levine Primary Care Debria Broecker: Ilsa Iha Other Clinician: Betha Loa Referring Emmalou Hunger: Treating Cary Lothrop/Extender: Roxine Caddy in Treatment: 12 Vital Signs Height(in): 77 Pulse(bpm): 73 Weight(lbs): 257 Blood Pressure(mmHg): 155/82 Body Mass Index(BMI): 30.5 Temperature(F): 97.8 Respiratory Rate(breaths/min): 18 [2:Photos:] [N/A:N/A] Right Amputation Site - Toe N/A N/A Wound Location: Surgical Injury N/A N/A Wounding Event: Diabetic Wound/Ulcer of the Lower N/A N/A Primary Etiology: Extremity Hypertension, Type II Diabetes N/A N/A Comorbid History: 06/03/2022 N/A N/A Date Acquired: 12 N/A N/A Weeks of Treatment: Open N/A N/A Wound Status: No N/A N/A Wound Recurrence: 0.4x0.5x0.2 N/A N/A Measurements L x W x D (cm) 0.157 N/A N/A A (cm) : rea 0.031 N/A N/A Volume (cm) : 95.10% N/A N/A % Reduction in A rea: 98.10% N/A N/A % Reduction in Volume: Grade 2 N/A  N/A Classification: Medium N/A N/A Exudate A mount: Serosanguineous N/A N/A Exudate Type: red, brown N/A N/A Exudate Color: Small (1-33%) N/A N/A Granulation A mount: Pink, Pale N/A N/A Granulation Quality: Large (67-100%) N/A N/A Necrotic A mount: Fat Layer (Subcutaneous Tissue): Yes N/A N/A Exposed Structures: Fascia: No Tendon: No Muscle: No Joint: No Bone: No None N/A N/A Epithelialization: Treatment Notes Electronic Signature(s) Signed: 09/20/2022 5:12:55 PM By: Betha Loa Entered By: Betha Loa on 09/20/2022 10:55:44 [name] ([id number]) [id number] Page 5 of 9 -------------------------------------------------------------------------------- Multi-Disciplinary Care Plan Details Patient Name: Date of Service: Robert Levine [facility] 09/20/2022 10:45 A M Medical Record Number: [id number] Patient Account Number: [id number] Date of  Birth/Sex: Treating RN: 07-01-1955 (67 y.o. Robert Levine Primary Care Shade Kaley: Ilsa Iha Other Clinician: Betha Loa Referring Kelissa Merlin: Treating Devlin Mcveigh/Extender: Roxine Caddy in Treatment: 12 Active Inactive Wound/Skin Impairment Nursing Diagnoses: Impaired tissue integrity Knowledge deficit related to ulceration/compromised skin integrity Goals: Patient/caregiver will verbalize understanding of skin care regimen Date Initiated: 06/27/2022 Target Resolution Date: 10/05/2022 Goal Status: Active Ulcer/skin breakdown will have a volume reduction of 30% by week 4 Date Initiated: 06/27/2022 Date Inactivated: 08/08/2022 Target Resolution Date: 07/28/2022 Unmet Reason: comorbidities / non Goal Status: Unmet compliance Ulcer/skin breakdown will have a volume reduction of 50% by week 8 Date Initiated: 06/27/2022 Date Inactivated: 09/12/2022 Target Resolution Date: 08/27/2022 Unmet Reason: comorbidities / non Goal Status: Unmet compliance Ulcer/skin breakdown will have a  volume reduction of 80% by week 12 Date Initiated: 06/27/2022 Target Resolution Date: 09/27/2022 Goal Status: Active Ulcer/skin breakdown will heal within 14 weeks Date Initiated: 06/27/2022 Target Resolution Date: 10/11/2022 Goal Status: Active Interventions: Assess patient/caregiver ability to obtain necessary supplies Assess patient/caregiver ability to perform ulcer/skin care regimen upon admission and as needed Assess ulceration(s) every visit Provide education on ulcer and skin care Treatment Activities: Referred to DME Malina Geers for dressing supplies : 06/27/2022 Skin care regimen initiated : 06/27/2022 Notes: Electronic Signature(s) Signed: 09/20/2022 4:16:40 PM By: Angelina Pih Signed: 09/20/2022 5:12:55 PM By: Betha Loa Entered By: Betha Loa on 09/20/2022 11:14:43 [name] ([id number]) [id number] Page 6 of 9 -------------------------------------------------------------------------------- Pain Assessment Details Patient Name: Date of Service: Robert Levine [facility] 09/20/2022 10:45 A M Medical Record Number: [id number] Patient Account Number: [id number] Date of Birth/Sex: Treating RN: 07-01-1955 (67 y.o. Robert Levine Primary Care Jamason Peckham: Ilsa Iha Other Clinician: Betha Loa Referring Mellina Benison: Treating Thijs Brunton/Extender: Roxine Caddy in Treatment: 12 Active Problems Location of Pain Severity and Description of Pain Patient Has Paino No Site Locations Pain Management and Medication Current Pain Management: Electronic Signature(s) Signed: 09/20/2022 4:16:40 PM By: Angelina Pih Signed: 09/20/2022 5:12:55 PM By: Betha Loa Entered By: Betha Loa on 09/20/2022 10:44:47 -------------------------------------------------------------------------------- Patient/Caregiver Education Details Patient Name: Date of Service: Robert Levine, [name] 6/20/2024andnbsp10:45 A M Medical Record Number:  [id number] Patient Account Number: [id number] Date of Birth/Gender: Treating RN: 1955/08/15 (67 y.o. Robert Levine Primary Care Physician: Ilsa Iha Other Clinician: Betha Loa Referring Physician: Treating Physician/Extender: Roxine Caddy in Treatment: [address] [place] ([id number]) [id number] Page 7 of 9 Education Assessment Education Provided To: Patient Education Topics Provided Wound/Skin Impairment: Handouts: Other: continue wound care as directed Methods: Explain/Verbal Responses: State content correctly Electronic Signature(s) Signed: 09/20/2022 5:12:55 PM By: Betha Loa Entered By: Betha Loa on 09/20/2022 11:15:02 -------------------------------------------------------------------------------- Wound Assessment Details Patient Name: Date of Service: Robert Levine [facility] 09/20/2022 10:45 A M Medical Record Number: [id number] Patient Account Number: [id number] Date of Birth/Sex: Treating RN: [birth year] (67 y.o. Robert Levine Primary Care Rykin Route: Ilsa Iha Other Clinician: Betha Loa Referring Jacquez Sheetz: Treating Jomel Whittlesey/Extender: Roxine Caddy in Treatment: 12 Wound Status Wound Number: 2 Primary Etiology: Diabetic Wound/Ulcer of the Lower Extremity Wound Location: Right Amputation Site - Toe Wound Status: Open Wounding Event: Surgical Injury Comorbid History: Hypertension, Type II Diabetes Date Acquired: 06/03/2022 Weeks Of Treatment: 12 Clustered Wound: No Photos Wound Measurements Length: (cm) 0.4 Width: (cm) 0.5 Depth: (cm) 0.2 Area: (cm) 0.157 Volume: (cm) 0.031 % Reduction in Area: 95.1% % Reduction in Volume: 98.1% Epithelialization: None Wound Description Classification: Grade 2 Exudate Amount: Medium Exudate Type: Serosanguineous AHMAD, MAGANN ([id number])  Exudate Color: red, brown Foul Odor After Cleansing: No Slough/Fibrino  Yes [id number].pdf Page 8 of 9 Wound Bed Granulation Amount: Small (1-33%) Exposed Structure Granulation Quality: Pink, Pale Fascia Exposed: No Necrotic Amount: Large (67-100%) Fat Layer (Subcutaneous Tissue) Exposed: Yes Necrotic Quality: Adherent Slough Tendon Exposed: No Muscle Exposed: No Joint Exposed: No Bone Exposed: No Treatment Notes Wound #2 (Amputation Site - Toe) Wound Laterality: Right Cleanser Soap and Water Discharge Instruction: Gently cleanse wound with antibacterial soap, rinse and pat dry prior to dressing wounds Peri-Wound Care Topical Primary Dressing Hydrofera Blue Ready Transfer Foam, 2.5x2.5 (in/in) Discharge Instruction: Apply Hydrofera Blue Ready to wound bed as directed Secondary Dressing Gauze Secured With Medipore T - 96M Medipore H Soft Cloth Surgical T ape ape, 2x2 (in/yd) Kerlix Roll Sterile or Non-Sterile 6-ply 4.5x4 (yd/yd) Discharge Instruction: Apply Kerlix as directed foam cup Compression Wrap Compression Stockings Add-Ons Electronic Signature(s) Signed: 09/20/2022 4:16:40 PM By: Angelina Pih Signed: 09/20/2022 5:12:55 PM By: Betha Loa Entered By: Betha Loa on 09/20/2022 10:54:42 -------------------------------------------------------------------------------- Vitals Details Patient Name: Date of Service: Robert Levine, [name] 09/20/2022 10:45 A M Medical Record Number: [id number] Patient Account Number: [id number] Date of Birth/Sex: Treating RN: Jul 14, 1955 (67 y.o. Robert Levine Primary Care Jahnya Trindade: Ilsa Iha Other Clinician: Betha Loa Referring Eleazar Kimmey: Treating Rawley Harju/Extender: Roxine Caddy in Treatment: 12 Vital Signs Time Taken: 10:43 Temperature (F): 97.8 Height (in): 77 Pulse (bpm): 73 Weight (lbs): 257 Respiratory Rate (breaths/min): 18 Body Mass Index (BMI): 30.5 Blood Pressure (mmHg): 155/82 Reference Range: 80 - 120 mg / dl ARDEAN, ELKO  ([id number]) [id number].pdf Page 9 of 9 Electronic Signature(s) Signed: 09/20/2022 5:12:55 PM By: Betha Loa Entered By: Betha Loa on 09/20/2022 10:44:43

## 2022-09-26 ENCOUNTER — Encounter (HOSPITAL_BASED_OUTPATIENT_CLINIC_OR_DEPARTMENT_OTHER): Payer: Medicare Other | Admitting: Internal Medicine

## 2022-09-26 DIAGNOSIS — L97512 Non-pressure chronic ulcer of other part of right foot with fat layer exposed: Secondary | ICD-10-CM

## 2022-09-26 DIAGNOSIS — E11621 Type 2 diabetes mellitus with foot ulcer: Secondary | ICD-10-CM | POA: Diagnosis not present

## 2022-09-26 NOTE — Progress Notes (Signed)
Robert, Levine ([id number]) [id number] Page 1 of 8 Visit Report for 09/26/2022 Arrival Information Details Patient Name: Date of Service: Robert Levine [facility] 09/26/2022 2:15 PM Medical Record Number: [id number] Patient Account Number: [id number] Date of Birth/Sex: Treating RN: June 08, 1955 (67 y.o. Robert Levine Primary Care Abednego Yeates: Ilsa Iha Other Clinician: Referring Israel Wunder: Treating Karol Skarzynski/Extender: Ammie Ferrier in Treatment: 13 Visit Information History Since Last Visit Added or deleted any medications: No Patient Arrived: [name] Any new allergies or adverse reactions: No Arrival Time: 14:41 Has Dressing in Place as Prescribed: Yes Accompanied By: self Has Footwear/Offloading in Place as Prescribed: Yes Transfer Assistance: None Right: T Contact Cast otal Patient Identification Verified: Yes Pain Present Now: No Secondary Verification Process Completed: Yes Patient Requires Transmission-Based Precautions: No Patient Has Alerts: Yes Patient Alerts: DM II Electronic Signature(s) Signed: 09/26/2022 4:45:45 PM By: Midge Aver MSN RN CNS WTA Entered By: Midge Aver on 09/26/2022 14:46:44 -------------------------------------------------------------------------------- Clinic Level of Care Assessment Details Patient Name: Date of Service: Robert Levine [facility] 09/26/2022 2:15 PM Medical Record Number: [id number] Patient Account Number: [id number] Date of Birth/Sex: Treating RN: 06-09-1955 (67 y.o. Robert Levine Primary Care Lazette Estala: Ilsa Iha Other Clinician: Referring Hannan Hutmacher: Treating Eilene Voigt/Extender: Ammie Ferrier in Treatment: 13 Clinic Level of Care Assessment Items TOOL 1 Quantity Score []  - 0 Use when EandM and Procedure is performed on INITIAL visit ASSESSMENTS - Nursing Assessment / Reassessment []  - 0 General Physical Exam (combine w/ comprehensive assessment (listed just  below) when performed on new pt. evals) []  - 0 Comprehensive Assessment (HX, ROS, Risk Assessments, Wounds Hx, etc.) ASSESSMENTS - Wound and Skin Assessment / Reassessment []  - 0 Dermatologic / Skin Assessment (not related to wound area) ASSESSMENTS - Ostomy and/or Continence Assessment and Care LYNN, SISSEL ([id number]) [id number] Page 2 of 8 []  - 0 Incontinence Assessment and Management []  - 0 Ostomy Care Assessment and Management (repouching, etc.) PROCESS - Coordination of Care []  - 0 Simple Patient / Family Education for ongoing care []  - 0 Complex (extensive) Patient / Family Education for ongoing care []  - 0 Staff obtains Chiropractor, Records, T Results / Process Orders est []  - 0 Staff telephones HHA, Nursing Homes / Clarify orders / etc []  - 0 Routine Transfer to another Facility (non-emergent condition) []  - 0 Routine Hospital Admission (non-emergent condition) []  - 0 New Admissions / Manufacturing engineer / Ordering NPWT Apligraf, etc. , []  - 0 Emergency Hospital Admission (emergent condition) PROCESS - Special Needs []  - 0 Pediatric / Minor Patient Management []  - 0 Isolation Patient Management []  - 0 Hearing / Language / Visual special needs []  - 0 Assessment of Community assistance (transportation, D/C planning, etc.) []  - 0 Additional assistance / Altered mentation []  - 0 Support Surface(s) Assessment (bed, cushion, seat, etc.) INTERVENTIONS - Miscellaneous []  - 0 External ear exam []  - 0 Patient Transfer (multiple staff / Nurse, adult / Similar devices) []  - 0 Simple Staple / Suture removal (25 or less) []  - 0 Complex Staple / Suture removal (26 or more) []  - 0 Hypo/Hyperglycemic Management (do not check if billed separately) []  - 0 Ankle / Brachial Index (ABI) - do not check if billed separately Has the patient been seen at the hospital within the last three years: Yes Total Score: 0 Level Of Care: ____ Electronic  Signature(s) Signed: 09/26/2022 4:45:45 PM By: Midge Aver MSN RN CNS WTA Entered By: Midge Aver on 09/26/2022 15:14:15 -------------------------------------------------------------------------------- Encounter Discharge Information Details  Patient Name: Date of Service: Robert Levine [facility] 09/26/2022 2:15 PM Medical Record Number: [id number] Patient Account Number: [id number] Date of Birth/Sex: Treating RN: Sep 05, 1955 (67 y.o. Robert Levine Primary Care Domnic Vantol: Ilsa Iha Other Clinician: Referring Isami Mehra: Treating Lamiracle Chaidez/Extender: Ammie Ferrier in Treatment: [phone] Encounter Discharge Information Items Post Procedure Vitals Discharge Condition: Stable Temperature (F): 98.1 ERSKINE, STEINFELDT ([id number]) [id number] Page 3 of 8 Ambulatory Status: Walker Pulse (bpm): 71 Discharge Destination: Home Respiratory Rate (breaths/min): 18 Transportation: Private Auto Blood Pressure (mmHg): 135/84 Accompanied By: self Schedule Follow-up Appointment: Yes Clinical Summary of Care: Electronic Signature(s) Signed: 09/26/2022 4:12:45 PM By: Midge Aver MSN RN CNS WTA Entered By: Midge Aver on 09/26/2022 16:12:45 -------------------------------------------------------------------------------- Lower Extremity Assessment Details Patient Name: Date of Service: [name], [name] [facility] 09/26/2022 2:15 PM Medical Record Number: [id number] Patient Account Number: [id number] Date of Birth/Sex: Treating RN: 1955/06/30 (67 y.o. Robert Levine Primary Care Liley Rake: Ilsa Iha Other Clinician: Referring Liya Strollo: Treating Allison Deshotels/Extender: Ammie Ferrier in Treatment: 13 Edema Assessment Assessed: Kyra Searles: No] Franne Forts: Yes] [Left: Edema] [Right: :] Calf Left: Right: Point of Measurement: 40 cm From Medial Instep 41 cm Ankle Left: Right: Point of Measurement: 13 cm From Medial Instep 31 cm Vascular  Assessment Pulses: Dorsalis Pedis Palpable: [Right:Yes] Electronic Signature(s) Signed: 09/26/2022 4:45:45 PM By: Midge Aver MSN RN CNS WTA Entered By: Midge Aver on 09/26/2022 15:02:00 Multi Wound Chart Details -------------------------------------------------------------------------------- [name] ([id number]) [id number] Page 4 of 8 Patient Name: Date of Service: Robert Levine [facility] 09/26/2022 2:15 PM Medical Record Number: [id number] Patient Account Number: [id number] Date of Birth/Sex: Treating RN: 01-17-1956 (67 y.o. Robert Levine Primary Care Kenya Shiraishi: Ilsa Iha Other Clinician: Referring Kierstyn Baranowski: Treating Christionna Poland/Extender: Ammie Ferrier in Treatment: 13 Vital Signs Height(in): 77 Pulse(bpm): 71 Weight(lbs): 257 Blood Pressure(mmHg): 135/84 Body Mass Index(BMI): 30.5 Temperature(F): 98.1 Respiratory Rate(breaths/min): 18 Wound Assessments Wound Number: 2 N/A N/A Photos: N/A N/A Right Amputation Site - Toe N/A N/A Wound Location: Surgical Injury N/A N/A Wounding Event: Diabetic Wound/Ulcer of the Lower N/A N/A Primary Etiology: Extremity Hypertension, Type II Diabetes N/A N/A Comorbid History: 06/03/2022 N/A N/A Date Acquired: 13 N/A N/A Weeks of Treatment: Open N/A N/A Wound Status: No N/A N/A Wound Recurrence: 0.2x0.3x0.1 N/A N/A Measurements L x W x D (cm) 0.047 N/A N/A A (cm) : rea 0.005 N/A N/A Volume (cm) : 98.50% N/A N/A % Reduction in A rea: 99.70% N/A N/A % Reduction in Volume: Grade 2 N/A N/A Classification: Medium N/A N/A Exudate A mount: Serosanguineous N/A N/A Exudate Type: red, brown N/A N/A Exudate Color: Small (1-33%) N/A N/A Granulation A mount: Pink, Pale N/A N/A Granulation Quality: Large (67-100%) N/A N/A Necrotic A mount: Fat Layer (Subcutaneous Tissue): Yes N/A N/A Exposed Structures: Fascia: No Tendon: No Muscle: No Joint: No Bone: No None N/A  N/A Epithelialization: Treatment Notes Electronic Signature(s) Signed: 09/26/2022 4:45:45 PM By: Midge Aver MSN RN CNS WTA Entered By: Midge Aver on 09/26/2022 15:07:41 -------------------------------------------------------------------------------- Multi-Disciplinary Care Plan Details Patient Name: Date of Service: [name], [name] 09/26/2022 2:15 PM [name] ([id number]) [id number] Page 5 of 8 Medical Record Number: [id number] Patient Account Number: [id number] Date of Birth/Sex: Treating RN: 31-Aug-1955 (67 y.o. Robert Levine Primary Care Marrianne Sica: Ilsa Iha Other Clinician: Referring Juno Bozard: Treating Scott Vanderveer/Extender: Ammie Ferrier in Treatment: 13 Active Inactive Wound/Skin Impairment Nursing Diagnoses: Impaired tissue integrity Knowledge deficit related to ulceration/compromised skin integrity Goals: Patient/caregiver will verbalize understanding of  skin care regimen Date Initiated: 06/27/2022 Target Resolution Date: 10/05/2022 Goal Status: Active Ulcer/skin breakdown will have a volume reduction of 30% by week 4 Date Initiated: 06/27/2022 Date Inactivated: 08/08/2022 Target Resolution Date: 07/28/2022 Unmet Reason: comorbidities / non Goal Status: Unmet compliance Ulcer/skin breakdown will have a volume reduction of 50% by week 8 Date Initiated: 06/27/2022 Date Inactivated: 09/12/2022 Target Resolution Date: 08/27/2022 Unmet Reason: comorbidities / non Goal Status: Unmet compliance Ulcer/skin breakdown will have a volume reduction of 80% by week 12 Date Initiated: 06/27/2022 Date Inactivated: 09/26/2022 Target Resolution Date: 09/27/2022 Goal Status: Met Ulcer/skin breakdown will heal within 14 weeks Date Initiated: 06/27/2022 Target Resolution Date: 10/11/2022 Goal Status: Active Interventions: Assess patient/caregiver ability to obtain necessary supplies Assess patient/caregiver ability to perform  ulcer/skin care regimen upon admission and as needed Assess ulceration(s) every visit Provide education on ulcer and skin care Treatment Activities: Referred to DME Tamecka Milham for dressing supplies : 06/27/2022 Skin care regimen initiated : 06/27/2022 Notes: Electronic Signature(s) Signed: 09/26/2022 4:45:45 PM By: Midge Aver MSN RN CNS WTA Entered By: Midge Aver on 09/26/2022 15:36:04 -------------------------------------------------------------------------------- Pain Assessment Details Patient Name: Date of Service: [name], [name] [facility] 09/26/2022 2:15 PM Medical Record Number: [id number] Patient Account Number: [id number] Date of Birth/Sex: Treating RN: 16-Oct-1955 (67 y.o. Robert Levine Primary Care Yarieliz Wasser: Ilsa Iha Other Clinician: Referring Kaye Mitro: Treating Sherelle Castelli/Extender: Ammie Ferrier in Treatment: [address] DAMONEY, JULIA ([id number]) [id number] Page 6 of 8 Location of Pain Severity and Description of Pain Patient Has Paino No Site Locations Pain Management and Medication Current Pain Management: Electronic Signature(s) Signed: 09/26/2022 4:45:45 PM By: Midge Aver MSN RN CNS WTA Entered By: Midge Aver on 09/26/2022 14:47:55 -------------------------------------------------------------------------------- Patient/Caregiver Education Details Patient Name: Date of Service: [name], [name] [facility] 6/26/2024andnbsp2:15 PM Medical Record Number: [id number] Patient Account Number: [id number] Date of Birth/Gender: Treating RN: [birth year] (67 y.o. Robert Levine Primary Care Physician: Ilsa Iha Other Clinician: Referring Physician: Treating Physician/Extender: Ammie Ferrier in Treatment: 13 Education Assessment Education Provided To: Patient Education Topics Provided Wound/Skin Impairment: Handouts: Caring for Your Ulcer Methods: Explain/Verbal Responses: State content  correctly Electronic Signature(s) Signed: 09/26/2022 4:45:45 PM By: Midge Aver MSN RN CNS WTA Entered By: Midge Aver on 09/26/2022 15:36:17 [name] ([id number]) [id number] Page 7 of 8 -------------------------------------------------------------------------------- Wound Assessment Details Patient Name: Date of Service: Robert Levine [facility] 09/26/2022 2:15 PM Medical Record Number: [id number] Patient Account Number: [id number] Date of Birth/Sex: Treating RN: 06/11/1955 (67 y.o. 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 Wound Status: Open Wounding Event: Surgical Injury Comorbid History: Hypertension, Type II Diabetes Date Acquired: 06/03/2022 Weeks Of Treatment: 13 Clustered Wound: No Photos Wound Measurements Length: (cm) 0.2 Width: (cm) 0.3 Depth: (cm) 0.1 Area: (cm) 0.047 Volume: (cm) 0.005 % Reduction in Area: 98.5% % Reduction in Volume: 99.7% Epithelialization: None Wound Description Classification: Grade 2 Exudate Amount: Medium Exudate Type: Serosanguineous Exudate Color: red, brown Foul Odor After Cleansing: No Slough/Fibrino Yes Wound Bed Granulation Amount: Small (1-33%) Exposed Structure Granulation Quality: Pink, Pale Fascia Exposed: No Necrotic Amount: Large (67-100%) Fat Layer (Subcutaneous Tissue) Exposed: Yes Necrotic Quality: Adherent Slough Tendon Exposed: No Muscle Exposed: No Joint Exposed: No Bone Exposed: No Treatment Notes Wound #2 (Amputation Site - Toe) Wound Laterality: Right Cleanser Soap  and Water Discharge Instruction: Gently cleanse wound with antibacterial soap, rinse and pat dry prior to dressing wounds Peri-Wound Care TIMO, HARTWIG  ([id number]) [id number] Page 8 of 8 Topical Primary Dressing Hydrofera Blue Ready Transfer Foam, 2.5x2.5 (in/in) Discharge Instruction: Apply Hydrofera Blue Ready to wound bed as directed Secondary Dressing Gauze Secured With Medipore T - 37M Medipore H Soft Cloth Surgical T ape ape, 2x2 (in/yd) Kerlix Roll Sterile or Non-Sterile 6-ply 4.5x4 (yd/yd) Discharge Instruction: Apply Kerlix as directed foam cup Compression Wrap Compression Stockings Add-Ons Electronic Signature(s) Signed: 09/26/2022 4:45:45 PM By: Midge Aver MSN RN CNS WTA Entered By: Midge Aver on 09/26/2022 15:00:44 -------------------------------------------------------------------------------- Vitals Details Patient Name: Date of Service: [name], [name] 09/26/2022 2:15 PM Medical Record Number: [id number] Patient Account Number: [id number] Date of Birth/Sex: Treating RN: Aug 23, 1955 (67 y.o. Robert Levine Primary Care Bronsen Serano: Ilsa Iha Other Clinician: Referring Mar Walmer: Treating Jahara Dail/Extender: Ammie Ferrier in Treatment: 13 Vital Signs Time Taken: 14:47 Temperature (F): 98.1 Height (in): 77 Pulse (bpm): 71 Weight (lbs): 257 Respiratory Rate (breaths/min): 18 Body Mass Index (BMI): 30.5 Blood Pressure (mmHg): 135/84 Reference Range: 80 - 120 mg / dl Electronic Signature(s) Signed: 09/26/2022 4:45:45 PM By: Midge Aver MSN RN CNS WTA Entered By: Midge Aver on 09/26/2022 14:47:50

## 2022-09-26 NOTE — Progress Notes (Signed)
AADIN, GAUT ([id number]) [id number] Page 1 of 10 Visit Report for 09/26/2022 Chief Complaint Document Details Patient Name: Date of Service: Robert Levine [facility] 09/26/2022 2:15 PM Medical Record Number: [id number] Patient Account Number: [id number] Date of Birth/Sex: Treating RN: 12-22-1955 (67 y.o. [name] Primary Care Provider: Ilsa Iha Other Clinician: Referring Provider: Treating Provider/Extender: Ammie Ferrier in Treatment: 13 Information Obtained from: Patient Chief Complaint 06/27/2022; Right great toe ulcer at amputation site Electronic Signature(s) Signed: 09/26/2022 4:25:37 PM By: Geralyn Corwin DO Entered By: Geralyn Corwin on 09/26/2022 15:13:56 -------------------------------------------------------------------------------- Debridement Details Patient Name: Date of Service: Robert Levine, [name] 09/26/2022 2:15 PM Medical Record Number: [id number] Patient Account Number: [id number] Date of Birth/Sex: Treating RN: 03-Jan-1956 (67 y.o. [name] Primary Care Provider: Ilsa Iha Other Clinician: Referring Provider: Treating Provider/Extender: Ammie Ferrier in Treatment: 13 Debridement Performed for Assessment: Wound #2 Right Amputation Site - Toe Performed By: Physician Geralyn Corwin, MD Debridement Type: Debridement Severity of Tissue Pre Debridement: Fat layer exposed Level of Consciousness (Pre-procedure): Awake and Alert Pre-procedure Verification/Time Out Yes - 15:08 Taken: Start Time: 15:08 Percent of Wound Bed Debrided: 100% T Area Debrided (cm): otal 0.05 Tissue and other material debrided: Viable, Non-Viable, Callus, Slough, Slough Level: Non-Viable Tissue Debridement Description: Selective/Open Wound Instrument: Curette Bleeding: Minimum Hemostasis Achieved: Pressure Procedural Pain: 0 Post Procedural Pain: 0 Response to Treatment: Procedure was  tolerated well [name] ([id number]) [id number] Page 2 of 10 Level of Consciousness (Post- Awake and Alert procedure): Post Debridement Measurements of Total Wound Length: (cm) 0.2 Width: (cm) 0.3 Depth: (cm) 0.1 Volume: (cm) 0.005 Character of Wound/Ulcer Post Debridement: Stable Severity of Tissue Post Debridement: Fat layer exposed Post Procedure Diagnosis Same as Pre-procedure Electronic Signature(s) Signed: 09/26/2022 4:25:37 PM By: Geralyn Corwin DO Signed: 09/26/2022 4:45:45 PM By: Midge Aver MSN RN CNS WTA Entered By: Midge Aver on 09/26/2022 15:09:39 -------------------------------------------------------------------------------- HPI Details Patient Name: Date of Service: Robert Levine, [name] 09/26/2022 2:15 PM Medical Record Number: [id number] Patient Account Number: [id number] Date of Birth/Sex: Treating RN: 1955/04/10 (67 y.o. [name] Primary Care Provider: Ilsa Iha Other Clinician: Referring Provider: Treating Provider/Extender: Ammie Ferrier in Treatment: 13 History of Present Illness HPI Description: 01-15-2022 patient presents today as a referral from Dr. Rosetta Posner who is a local podiatrist due to a foot wound that is not getting better. The patient unfortunately has had this going on for quite some time he is not necessarily the best historian which makes it a little bit difficult but nonetheless I did review notes as well together where things stand what is going on right now has been using Vaseline over the area according to what he tells me he has had debridements with Dr. Excell Seltzer intermittently. With that being said the patient has not really been using his offloading shoe as much she does have diabetic shoes and he tells me that is primarily what has been wearing. 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 He also has high blood pressure. 01-25-2022 upon evaluation today patient's wound actually showing signs of improvement though there is still little bit of callus and some biofilm and slough buildup on the surface of the wound. I Minna perform sharp debridement to clear this away today. He tells me has been using the shoe a lot of  the time though sometimes when he is at the assisted living facility he just walks around with a sock not using the shoe. I explained that he needs to make sure to always have the shoe on when he is up and moving around as can help this to heal most effectively. 02-01-2022 upon evaluation today patient appears to be doing well currently in regard to his wound from the standpoint of this not getting worse it does not look to be infected. With that being said unfortunately he does have an open wound that is going to require some sharp debridement today and again I am concerned that if we do not get this closed he is going to end up with this worsening in general. He has not really been wearing the postop shoe with front offloading unfortunately which means that he is not really getting much better. I think that a total contact cast is probably going to be the method in which to try to get this healed as quickly as possible. Again the sooner we get healed lessen the chance that this will develop into a more significant ulceration requiring any type of surgery. We especially would avoid any chance for amputation. 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 Here for application of TCC #1 11/9; obligatory first total contact cast change. The patient had no particular problems. TCC reapplied 02-15-2022 upon evaluation today patient appears to be doing well currently in regard to his wound. There is some callus however buildup that is going to need to be cleared away. Fortunately there does not appear to be  any signs of infection this is actually his third cast application today although the first 2 were last week and this is already quite a bit smaller. 02-19-2022 upon evaluation today patient's wound actually is showing signs of doing well with the cast he is actually making good progress. Fortunately I do not see any evidence of infection locally or systemically at this time which is great news overall I think he is on the right track. 02-26-2022 upon evaluation today patient actually appears to be completely healed which is great news. Fortunately I see no evidence of active infection at this time locally or systemically which is great news as well. 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 He has been treated in our clinic for this issue previously with a total contact cast. He has a podiatrist. He resides in a nursing facility. They have recommended he come in for his wound care. He is fairly unaware of the wound and its condition. 4/10; patient presents for follow-up. He has been using Hydrofera Blue to the wound bed along with his front offloading shoe on the right foot. He has taken his oral antibiotics without issues. 4/24; patient presents for follow-up. He has been using Hydrofera Blue to the wound bed. He has stopped using his front offloading shoe because he thinks it is not helping. He has no issues or complaints today. He would like to follow-up every 2 weeks. 5/8; this is a patient with a wound at the amp site of the right great toe. There is a small horizontal linear wound but surrounded with thick  buildup of callus. He  is intermittently using his forefoot offloading boot but even with this he probably is not walking in this properly not putting the weight back on his heel I went over this with him. If this does not work he probably needs a total contact cast and I think he has had 1 of these in the past. Not sure if there is an issue We have been using Hydrofera Blue as the primary dressing 08-16-2022 this is a gentleman that I have previously seen in the clinic although the patient tells me that this is not doing too poorly in fact he feels like that it is doing better. He does have home health coming out. With that being said I do not see any signs of active infection locally nor systemically at this time which is great news. He does have a tremendous amount of callus however there is no need to be managed. 6/1; we did not have a total contact cast to apply and I think the wound on the plantar right first metatarsal head was about the same. He is using a forefoot off loader and dressing with Hydrofera Blue. Unfortunately he has significant callus around the wound area 5/29; patient presents for follow-up. He has been using Hydrofera Blue to the wound bed. The wound has declined in appearance. We discussed a total contact cast and patient was agreeable to move forward with this at next clinic visit. 6/12; patient presents for follow-up. He is ready for the total contact cast placement today. Wound is overall stable from last visit. No signs of infection. 09-14-2022 upon evaluation today patient appears to be doing well currently in regard to his wound after 2 days he is already showing signs of improvement which is great news. Fortunately I do not see any signs of active infection locally nor systemically at this time. 09-20-2022 upon evaluation patient's wound bed actually showed signs of good granulation epithelization this is significantly smaller he still has a lot of callus around the edges of the wound and will  perform debridement to clear this away. 6/26; patient presents for follow-up. We have been using Hydrofera Blue under the total contact cast to the right lower foot wound. His wound is smaller. He is tolerated the total contact cast well. Electronic Signature(s) Signed: 09/26/2022 4:25:37 PM By: Geralyn Corwin DO Entered By: Geralyn Corwin on 09/26/2022 15:14:31 -------------------------------------------------------------------------------- Physical Exam Details Patient Name: Date of Service: Robert Levine [facility] 09/26/2022 2:15 PM Medical Record Number: [id number] Patient Account Number: [id number] Date of Birth/Sex: Treating RN: 1955/12/23 (67 y.o. [name] Primary Care Provider: Ilsa Iha Other Clinician: Referring Provider: Treating Provider/Extender: Ammie Ferrier in Treatment: 13 Constitutional . Cardiovascular . Psychiatric . Notes Previous right great toe amputation. T the plantar aspect of the previous amputation site there is an open wound with granualtion tissue And slough o accumulation. No signs of surrounding infection Including increased warmth, erythema or purulent drainage. Electronic Signature(s) Signed: 09/26/2022 4:25:37 PM By: Daleen Snook ([id number]) [id number] Page 4 of 10 Entered By: Geralyn Corwin on 09/26/2022 15:16:06 -------------------------------------------------------------------------------- Physician Orders Details Patient Name: Date of Service: Robert Levine [facility] 09/26/2022 2:15 PM Medical Record Number: [id number] Patient Account Number: [id number] Date of Birth/Sex: Treating RN: 1955/10/11 (67 y.o. [name] Primary Care Provider: Ilsa Iha Other Clinician: Referring Provider: Treating Provider/Extender: Ammie Ferrier in Treatment: 62 Verbal / Phone Orders: No Diagnosis Coding Follow-up Appointments Return Appointment  in 1 week. Nurse Visit as needed Home Health Home Health Company: - [facility] for wound care. May utilize formulary equivalent dressing for wound treatment orders unless otherwise specified. Home Health Nurse may visit PRN to address patients wound care needs. - place on hold , patient wearing a cast,[facility] [phone] Scheduled days for dressing changes to be completed; exception, patient has scheduled wound care visit that day. **Please direct any NON-WOUND related issues/requests for orders to patient's Primary Care Physician. **If current dressing causes regression in wound condition, may D/C ordered dressing product/s and apply Normal Saline Moist Dressing daily until next Wound Healing Center or Other MD appointment. **Notify Wound Healing Center of regression in wound condition at [phone]. Bathing/ [employer] wounds with antibacterial soap and water. Anesthetic (Use 'Patient Medications' Section for Anesthetic Order Entry) Lidocaine applied to wound bed Edema Control - Lymphedema / Segmental Compressive Device / Other Elevate, Exercise Daily and A void Standing for Long Periods of Time. Elevate leg(s) parallel to the floor when sitting. DO YOUR BEST to sleep in the bed at night. DO NOT sleep in your recliner. Long hours of sitting in a recliner leads to swelling of the legs and/or potential wounds on your backside. Off-Loading Total Contact Cast to Right Lower Extremity Wound Treatment Wound #2 - Amputation Site - Toe Wound Laterality: Right Cleanser: Soap and Water 1 x Per Day/30 Days Discharge Instructions: Gently cleanse wound with antibacterial soap, rinse and pat dry prior to dressing wounds Prim Dressing: Hydrofera Blue Ready Transfer Foam, 2.5x2.5 (in/in) (Dispense As Written) 1 x Per Day/30 Days ary Discharge Instructions: Apply Hydrofera Blue Ready to wound bed as directed Secondary Dressing: Gauze 1 x Per Day/30 Days Secured With:  Medipore T - 2M Medipore H Soft Cloth Surgical T ape ape, 2x2 (in/yd) 1 x Per Day/30 Days Secured With: [employer] Sterile or Non-Sterile 6-ply 4.5x4 (yd/yd) 1 x Per Day/30 Days Discharge Instructions: Apply Kerlix as directed Secured With: foam cup 1 x Per Day/30 Days Electronic Signature(s) Signed: 09/26/2022 4:25:37 PM By: Geralyn Corwin DO Entered By: Geralyn Corwin on 09/26/2022 15:17:24 [name] ([id number]) [id number] Page 5 of 10 -------------------------------------------------------------------------------- Problem List Details Patient Name: Date of Service: Robert Levine [facility] 09/26/2022 2:15 PM Medical Record Number: [id number] Patient Account Number: [id number] Date of Birth/Sex: Treating RN: 1955/06/29 (67 y.o. [name] Primary Care Provider: Ilsa Iha Other Clinician: Referring Provider: Treating Provider/Extender: Ammie Ferrier in Treatment: 13 Active Problems ICD-10 Encounter Code Description Active Date MDM Diagnosis E11.621 Type 2 diabetes mellitus with foot ulcer 06/27/2022 No Yes L97.512 Non-pressure chronic ulcer of other part of right foot with fat layer exposed 06/27/2022 No Yes Z89.411 Acquired absence of right great toe 06/27/2022 No Yes N18.30 Chronic kidney disease, stage 3 unspecified 06/27/2022 No Yes I10 Essential (primary) hypertension 06/27/2022 No Yes Inactive Problems Resolved Problems Electronic Signature(s) Signed: 09/26/2022 4:25:37 PM By: Geralyn Corwin DO Entered By: Geralyn Corwin on 09/26/2022 15:13:51 -------------------------------------------------------------------------------- Progress Note Details Patient Name: Date of Service: Robert Levine, [name] 09/26/2022 2:15 PM [name] ([id number]) [id number] Page 6 of 10 Medical Record Number: [id number] Patient Account Number: [id number] Date of Birth/Sex: Treating RN: 05-21-1955 (67 y.o.  [name] Primary Care Provider: Ilsa Iha Other Clinician: Referring Provider: Treating Provider/Extender: Ammie Ferrier in Treatment: 13 Subjective Chief Complaint Information obtained from Patient 06/27/2022; Right great toe ulcer at amputation site History of Present Illness (HPI) 01-15-2022 patient presents today as  a referral from Dr. Rosetta Posner who is a local podiatrist due to a foot wound that is not getting better. The patient unfortunately has had this going on for quite some time he is not necessarily the best historian which makes it a little bit difficult but nonetheless I did review notes as well together where things stand what is going on right now has been using Vaseline over the area according to what he tells me he has had debridements with Dr. Excell Seltzer intermittently. With that being said the patient has not really been using his offloading shoe as much she does have diabetic shoes and he tells me that is primarily what has been wearing. 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 He also has high blood pressure. 01-25-2022 upon evaluation today patient's wound actually showing signs of improvement though there is still little bit of callus and some biofilm and slough buildup on the surface of the wound. I Minna perform sharp debridement to clear this away today. He tells me has been using the shoe a lot of the time though sometimes when he is at the assisted living facility he just walks around with a sock not using the shoe. I explained that he needs to make sure to always have the shoe on when he is up and moving around as can help this to heal most effectively. 02-01-2022 upon evaluation today patient appears to be doing well currently in regard to his wound from the standpoint of this not  getting worse it does not look to be infected. With that being said unfortunately he does have an open wound that is going to require some sharp debridement today and again I am concerned that if we do not get this closed he is going to end up with this worsening in general. He has not really been wearing the postop shoe with front offloading unfortunately which means that he is not really getting much better. I think that a total contact cast is probably going to be the method in which to try to get this healed as quickly as possible. Again the sooner we get healed lessen the chance that this will develop into a more significant ulceration requiring any type of surgery. We especially would avoid any chance for amputation. 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 Here for application of TCC #1 11/9; obligatory first total contact cast change. The patient had no particular problems. TCC reapplied 02-15-2022 upon evaluation today patient appears to be doing well currently in regard to his wound. There is some callus however buildup that is going to need to be cleared away. Fortunately there does not appear to be any signs of infection this is actually his third cast application today although the first 2 were last week and this is already quite a bit smaller. 02-19-2022 upon evaluation today patient's wound actually is showing signs of doing well with the cast he is actually making good progress. Fortunately I do not see any evidence of infection locally or systemically at this time which is great news overall I think he is on the right track. 02-26-2022 upon evaluation today patient actually appears to be completely healed which is great news. Fortunately I see no evidence of active infection at this time locally or systemically which is great news as well. 06/27/2022 Robert Levine is  a 67 year old male with a past medical history of uncontrolled insulin-dependent type 2 diabetes,  previous amputation to the right great toe and stage III chronic kidney disease that presents the clinic for a 1 month history of nonhealing ulcer to the previous right great toe amputation site. He has been treated in our clinic for this issue previously with a total contact cast. He has a podiatrist. He resides in a nursing facility. They have recommended he come in for his wound care. He is fairly unaware of the wound and its condition. 4/10; patient presents for follow-up. He has been using Hydrofera Blue to the wound bed along with his front offloading shoe on the right foot. He has taken his oral antibiotics without issues. 4/24; patient presents for follow-up. He has been using Hydrofera Blue to the wound bed. He has stopped using his front offloading shoe because he thinks it is not helping. He has no issues or complaints today. He would like to follow-up every 2 weeks. 5/8; this is a patient with a wound at the amp site of the right great toe. There is a small horizontal linear wound but surrounded with thick buildup of callus. He is intermittently using his forefoot offloading boot but even with this he probably is not walking in this properly not putting the weight back on his heel I went over this with him. If this does not work he probably needs a total contact cast and I think he has had 1 of these in the past. Not sure if there is an issue We have been using Hydrofera Blue as the primary dressing 08-16-2022 this is a gentleman that I have previously seen in the clinic although the patient tells me that this is not doing too poorly in fact he feels like that it is doing better. He does have home health coming out. With that being said I do not see any signs of active infection locally nor systemically at this time which is great news. He does have a tremendous amount of callus however there is no need to be managed. 6/1; we did not have a total contact cast to apply and I think the wound  on the plantar right first metatarsal head was about the same. He is using a forefoot off loader and dressing with Hydrofera Blue. Unfortunately he has significant callus around the wound area 5/29; patient presents for follow-up. He has been using Hydrofera Blue to the wound bed. The wound has declined in appearance. We discussed a total contact cast and patient was agreeable to move forward with this at next clinic visit. 6/12; patient presents for follow-up. He is ready for the total contact cast placement today. Wound is overall stable from last visit. No signs of infection. 09-14-2022 upon evaluation today patient appears to be doing well currently in regard to his wound after 2 days he is already showing signs of improvement which is great news. Fortunately I do not see any signs of active infection locally nor systemically at this time. 09-20-2022 upon evaluation patient's wound bed actually showed signs of good granulation epithelization this is significantly smaller he still has a lot of callus around the edges of the wound and will perform debridement to clear this away. 6/26; patient presents for follow-up. We have been using Hydrofera Blue under the total contact cast to the right lower foot wound. His wound is smaller. He is tolerated the total contact cast well. Robert Levine, Robert Levine ([id number]) [id number] Page 7 of 10  Objective Constitutional Vitals Time Taken: 2:47 PM, Height: 77 in, Weight: 257 lbs, BMI: 30.5, Temperature: 98.1 F, Pulse: 71 bpm, Respiratory Rate: 18 breaths/min, Blood Pressure: 135/84 mmHg. General Notes: Previous right great toe amputation. T the plantar aspect of the previous amputation site there is an open wound with granualtion tissue And o slough accumulation. No signs of surrounding infection Including increased warmth, erythema or purulent drainage. Integumentary (Hair, Skin) Wound #2 status is Open. Original cause of wound was  Surgical Injury. The date acquired was: 06/03/2022. The wound has been in treatment 13 weeks. The wound is located on the Right Amputation Site - T The wound measures 0.2cm length x 0.3cm width x 0.1cm depth; 0.047cm^2 area and 0.005cm^3 volume. oe. There is Fat Layer (Subcutaneous Tissue) exposed. There is a medium amount of serosanguineous drainage noted. There is small (1-33%) pink, pale granulation within the wound bed. There is a large (67-100%) amount of necrotic tissue within the wound bed including Adherent Slough. 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 I debrided nonviable tissue. I recommended continuing Hydrofera Blue under the total contact cast. Follow-up in 1 week. Procedures Wound #2 Pre-procedure diagnosis of Wound #2 is a Diabetic Wound/Ulcer of the Lower Extremity located on the Right Amputation Site - T .Severity of Tissue Pre oe Debridement is: Fat layer exposed. There was a Selective/Open Wound Non-Viable Tissue Debridement with a total area of 0.05 sq cm performed by Geralyn Corwin, MD. With the following instrument(s): Curette to remove Viable and Non-Viable tissue/material. Material removed includes Callus and Slough and. No specimens were taken. A time out was conducted at 15:08, prior to the start of the procedure. A Minimum amount of bleeding was controlled with Pressure. The procedure was tolerated well with a pain level of 0 throughout and a pain level of 0 following the procedure. Post Debridement Measurements: 0.2cm length x 0.3cm width x 0.1cm depth; 0.005cm^3 volume. Character of Wound/Ulcer Post Debridement is stable. Severity of Tissue Post Debridement  is: Fat layer exposed. 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 There was a T Contact oe otal Cast Procedure by Geralyn Corwin, MD. Post procedure Diagnosis Wound #2: Same as Pre-Procedure Plan Follow-up Appointments: Return Appointment in 1 week. Nurse Visit as needed Home Health: Home Health Company: - [facility] for wound care. May utilize formulary equivalent dressing for wound treatment orders unless otherwise specified. Home Health Nurse may visit PRN to address patients wound care needs. - place on hold , patient wearing a cast,[facility] [phone] Scheduled days for dressing changes to be completed; exception, patient has scheduled wound care visit that day. **Please direct any NON-WOUND related issues/requests for orders to patient's Primary Care Physician. **If current dressing causes regression in wound condition, may D/C ordered dressing product/s and apply Normal Saline Moist Dressing daily until next Wound Healing Center or Other MD appointment. **Notify Wound Healing Center of regression in wound condition at [phone]. Bathing/ Shower/ Hygiene: Wash wounds with antibacterial soap and water. Anesthetic (Use 'Patient Medications' Section for Anesthetic Order Entry): Lidocaine applied to wound bed Edema Control - Lymphedema / Segmental  Compressive Device / Other: Elevate, Exercise Daily and Avoid Standing for Long Periods of Time. Robert Levine, Robert Levine ([id number]) [id number] Page 8 of 10 Elevate leg(s) parallel to the floor when sitting. DO YOUR BEST to sleep in the bed at night. DO NOT sleep in your recliner. Long hours of sitting in a recliner leads to swelling of the legs and/or potential wounds on your backside. Off-Loading: T Contact Cast to Right Lower Extremity otal WOUND #2: -  Amputation Site - T oe Wound Laterality: Right Cleanser: Soap and Water 1 x Per Day/30 Days Discharge Instructions: Gently cleanse wound with antibacterial soap, rinse and pat dry prior to dressing wounds Prim Dressing: Hydrofera Blue Ready Transfer Foam, 2.5x2.5 (in/in) (Dispense As Written) 1 x Per Day/30 Days ary Discharge Instructions: Apply Hydrofera Blue Ready to wound bed as directed Secondary Dressing: Gauze 1 x Per Day/30 Days Secured With: Medipore T - 79M Medipore H Soft Cloth Surgical T ape ape, 2x2 (in/yd) 1 x Per Day/30 Days Secured With: [employer] Sterile or Non-Sterile 6-ply 4.5x4 (yd/yd) 1 x Per Day/30 Days Discharge Instructions: Apply Kerlix as directed Secured With: foam cup 1 x Per Day/30 Days 1. In office sharp debridement 2. Hydrofera Blue 3. T contact cast placed in standard fashion otal 4. Follow-up in 1 week Electronic Signature(s) Signed: 09/26/2022 4:25:37 PM By: Geralyn Corwin DO Entered By: Geralyn Corwin on 09/26/2022 15:16:53 -------------------------------------------------------------------------------- ROS/PFSH Details Patient Name: Date of Service: Robert Levine, [name] 09/26/2022 2:15 PM Medical Record Number: [id number] Patient Account Number: [id number] Date of Birth/Sex: Treating RN: Aug 09, 1955 (67 y.o. [name] Primary Care Provider: Ilsa Iha Other Clinician: Referring Provider: Treating Provider/Extender: Ammie Ferrier in Treatment: 13 Information Obtained From Patient Cardiovascular Medical History: Positive for: Hypertension Endocrine Medical History: Positive for: Type II Diabetes Time with diabetes: 5 years Treated with: Insulin Genitourinary Medical History: Negative for: End Stage Renal Disease Past Medical History Notes: CKD3 Integumentary (Skin) Medical History: Negative for: History of Burn; History of pressure wounds Robert Levine, Robert Levine ([id number])  [id number] Page 9 of 10 Immunizations Pneumococcal Vaccine: Received Pneumococcal Vaccination: Yes Received Pneumococcal Vaccination On or After 60th Birthday: Yes Implantable Devices None Family and Social History Never smoker; Marital Status - Single; Alcohol Use: Never; Drug Use: No History; Caffeine Use: Daily Electronic Signature(s) Signed: 09/26/2022 4:25:37 PM By: Geralyn Corwin DO Signed: 09/26/2022 4:45:45 PM By: Midge Aver MSN RN CNS WTA Entered By: Geralyn Corwin on 09/26/2022 15:17:33 -------------------------------------------------------------------------------- Total Contact Cast Details Patient Name: Date of Service: Robert Levine, [name] [facility] 09/26/2022 2:15 PM Medical Record Number: [id number] Patient Account Number: [id number] Date of Birth/Sex: Treating RN: [birth year] (67 y.o. [name] Primary Care Provider: Ilsa Iha Other Clinician: Referring Provider: Treating Provider/Extender: Ammie Ferrier in Treatment: 13 T Contact Cast Applied for Wound Assessment: otal Wound #2 Right Amputation Site - Toe Performed By: Physician Geralyn Corwin, MD Post Procedure Diagnosis Same as Pre-procedure Electronic Signature(s) Signed: 09/26/2022 4:25:37 PM By: Geralyn Corwin DO Signed: 09/26/2022 4:45:45 PM By: Midge Aver MSN RN CNS WTA Entered By: Midge Aver on 09/26/2022 15:09:54 -------------------------------------------------------------------------------- SuperBill Details Patient Name: Date of Service: Robert Levine, [name] [facility] 09/26/2022 Medical Record Number: [id number] Patient Account Number: [id number] Date of Birth/Sex: Treating RN: 1956-02-25 (67 y.o. 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 N18.30 Chronic kidney disease, stage 3 unspecified I10 Essential (primary) hypertension Facility Procedures : CPT4 Code: [id number] Description: 97597 - DEBRIDE WOUND 1ST 20 SQ CM OR < ICD-10 Diagnosis Description L97.512 Non-pressure chronic ulcer of other part of right foot with fat layer exposed Modifier: Quantity: 1 Physician Procedures : CPT4 Code Description Modifier [id number] 97597 - WC PHYS DEBR WO ANESTH 20 SQ CM ICD-10 Diagnosis Description L97.512 Non-pressure chronic ulcer of other part of right foot with fat layer exposed Quantity: 1 Electronic Signature(s) Signed: 09/26/2022 4:12:01 PM By: Midge Aver MSN RN CNS WTA Signed: 09/26/2022 4:25:37 PM By: Geralyn Corwin DO Entered By: Midge Aver on 09/26/2022 16:12:01

## 2022-09-26 NOTE — Progress Notes (Addendum)
JARAE, KLEINTOP ([id number]) [id number] Page 1 of 9 Visit Report for 09/05/2022 Arrival Information Details Patient Name: Date of Service: [name] [facility] 09/05/2022 3:45 PM Medical Record Number: [id number] Patient Account Number: [id number] Date of Birth/Sex: Treating RN: [birth year] (66 y.o. [name]) Yevonne Pax Primary Care Adama Ivins: Ilsa Iha Other Clinician: Referring Treyon Wymore: Treating Erisa Mehlman/Extender: Chauncey Mann, MICHA EL Janeth Rase in Treatment: 10 Visit Information History Since Last Visit Added or deleted any medications: No Patient Arrived: [name] Any new allergies or adverse reactions: No Arrival Time: 15:39 Had a fall or experienced change in No Accompanied By: self activities of daily living that may affect Transfer Assistance: None risk of falls: Patient Identification Verified: Yes Signs or symptoms of abuse/neglect since last visito No Secondary Verification Process Completed: Yes Hospitalized since last visit: No Patient Requires Transmission-Based Precautions: No Implantable device outside of the clinic excluding No Patient Has Alerts: Yes cellular tissue based products placed in the center Patient Alerts: DM II since last visit: Has Dressing in Place as Prescribed: Yes Pain Present Now: No Electronic Signature(s) Signed: 10/01/2022 1:35:33 PM By: Elliot Gurney, [name], RN, CWS, [name], [name] Previous Signature: 09/26/2022 11:52:51 AM Version By: Yevonne Pax RN Entered By: Elliot Gurney, [name], RN, CWS, [name] on 10/01/2022 13:35:33 -------------------------------------------------------------------------------- Clinic Level of Care Assessment Details Patient Name: Date of Service: [name] [facility] 09/05/2022 3:45 PM Medical Record Number: [id number] Patient Account Number: [id number] Date of Birth/Sex: Treating RN: 02-01-1956 (66 y.o. [name]) Yevonne Pax Primary Care Shawntay Prest: Ilsa Iha Other Clinician: Referring Pacen Watford: Treating  Sherri Levenhagen/Extender: RO BSO Dorris Carnes, MICHA EL Janeth Rase in Treatment: 10 Clinic Level of Care Assessment Items TOOL 1 Quantity Score []  - 0 Use when EandM and Procedure is performed on INITIAL visit ASSESSMENTS - Nursing Assessment / Reassessment []  - 0 General Physical Exam (combine w/ comprehensive assessment (listed just below) when performed on new pt. evals) []  - 0 Comprehensive Assessment (HX, ROS, Risk Assessments, Wounds Hx, etc.) NADIM, ANDRETTA ([id number]) [id number] Page 2 of 9 ASSESSMENTS - Wound and Skin Assessment / Reassessment []  - 0 Dermatologic / Skin Assessment (not related to wound area) ASSESSMENTS - Ostomy and/or Continence Assessment and Care []  - 0 Incontinence Assessment and Management []  - 0 Ostomy Care Assessment and Management (repouching, etc.) PROCESS - Coordination of Care []  - 0 Simple Patient / Family Education for ongoing care []  - 0 Complex (extensive) Patient / Family Education for ongoing care []  - 0 Staff obtains Chiropractor, Records, T Results / Process Orders est []  - 0 Staff telephones HHA, Nursing Homes / Clarify orders / etc []  - 0 Routine Transfer to another Facility (non-emergent condition) []  - 0 Routine Hospital Admission (non-emergent condition) []  - 0 New Admissions / Manufacturing engineer / Ordering NPWT Apligraf, etc. , []  - 0 Emergency Hospital Admission (emergent condition) PROCESS - Special Needs []  - 0 Pediatric / Minor Patient Management []  - 0 Isolation Patient Management []  - 0 Hearing / Language / Visual special needs []  - 0 Assessment of Community assistance (transportation, D/C planning, etc.) []  - 0 Additional assistance / Altered mentation []  - 0 Support Surface(s) Assessment (bed, cushion, seat, etc.) INTERVENTIONS - Miscellaneous []  - 0 External ear exam []  - 0 Patient Transfer (multiple staff / Nurse, adult / Similar devices) []  - 0 Simple Staple / Suture removal  (25 or less) []  - 0 Complex Staple / Suture removal (26 or more) []  - 0 Hypo/Hyperglycemic Management (do not check if billed separately) []  -  0 Ankle / Brachial Index (ABI) - do not check if billed separately Has the patient been seen at the hospital within the last three years: Yes Total Score: 0 Level Of Care: ____ Electronic Signature(s) Signed: 10/01/2022 3:29:09 PM By: Elliot Gurney, [name], RN, CWS, [name], [name] Previous Signature: 09/26/2022 11:52:51 AM Version By: Yevonne Pax RN Entered By: Elliot Gurney, [name], RN, CWS, [name] on 10/01/2022 13:36:59 -------------------------------------------------------------------------------- Encounter Discharge Information Details Patient Name: Date of Service: [name], [name] [facility] 09/05/2022 3:45 PM Medical Record Number: [id number] Patient Account Number: [id number] Date of Birth/Sex: Treating RN: Aug 15, 1955 (66 y.o. [name] Primary Care Ilyse Tremain: Ilsa Iha Other Clinician: Referring Leone Putman: Treating Chezney Huether/Extender: [address] MICHA EL Colin Rhein [place], [place] ([id number]) [id number] Page 3 of 9 Weeks in Treatment: 10 Encounter Discharge Information Items Post Procedure Vitals Discharge Condition: Stable Temperature (F): 98.3 Ambulatory Status: Ambulatory Pulse (bpm): 80 Discharge Destination: Home Respiratory Rate (breaths/min): 18 Transportation: Private Auto Blood Pressure (mmHg): 142/74 Accompanied By: self Schedule Follow-up Appointment: Yes Clinical Summary of Care: Electronic Signature(s) Signed: 10/01/2022 1:39:58 PM By: Elliot Gurney, [name], RN, CWS, [name], [name] Previous Signature: 09/26/2022 11:52:51 AM Version By: Yevonne Pax RN Entered By: Elliot Gurney, [name], RN, CWS, [name] on 10/01/2022 13:39:57 -------------------------------------------------------------------------------- Lower Extremity Assessment Details Patient Name: Date of Service: [name] [facility] 09/05/2022 3:45 PM Medical Record Number:  [id number] Patient Account Number: [id number] Date of Birth/Sex: Treating RN: 16-Sep-1955 (66 y.o. [name]) Yevonne Pax Primary Care Chasey Dull: Ilsa Iha Other Clinician: Referring Elyza Whitt: Treating Ciarah Peace/Extender: RO BSO N, MICHA EL Janeth Rase in Treatment: 10 Edema Assessment Assessed: [Left: No] [Right: No] Edema: [Left: Ye] [Right: s] Calf Left: Right: Point of Measurement: 40 cm From Medial Instep 42 cm Ankle Left: Right: Point of Measurement: 13 cm From Medial Instep 31 cm Vascular Assessment Pulses: Dorsalis Pedis Palpable: [Right:Yes] Electronic Signature(s) Signed: 10/01/2022 1:36:11 PM By: Elliot Gurney, [name], RN, CWS, [name], [name] Signed: 10/01/2022 1:52:37 PM By: Yevonne Pax RN Previous Signature: 09/26/2022 11:52:51 AM Version By: Yevonne Pax RN Entered By: Elliot Gurney, [name], RN, CWS, [name] on 10/01/2022 13:36:11 [name] ([id number]) [id number] Page 4 of 9 -------------------------------------------------------------------------------- Multi Wound Chart Details Patient Name: Date of Service: [name] [facility] 09/05/2022 3:45 PM Medical Record Number: [id number] Patient Account Number: [id number] Date of Birth/Sex: Treating RN: 1955/06/27 (66 y.o. [name]) Yevonne Pax Primary Care Nidhi Jacome: Ilsa Iha Other Clinician: Referring Baylen Dea: Treating Daleyza Gadomski/Extender: RO BSO N, MICHA EL Janeth Rase in Treatment: 10 Vital Signs Height(in): 77 Pulse(bpm): 80 Weight(lbs): 257 Blood Pressure(mmHg): 142/74 Body Mass Index(BMI): 30.5 Temperature(F): 98.3 Respiratory Rate(breaths/min): 18 [2:Photos:] [N/A:N/A] Right Amputation Site - Toe N/A N/A Wound Location: Surgical Injury N/A N/A Wounding Event: Diabetic Wound/Ulcer of the Lower N/A N/A Primary Etiology: Extremity Hypertension, Type II Diabetes N/A N/A Comorbid History: 06/03/2022 N/A N/A Date Acquired: 10 N/A N/A Weeks of Treatment: Open N/A N/A Wound Status: No N/A  N/A Wound Recurrence: 0.7x0.3x0.4 N/A N/A Measurements L x W x D (cm) 0.165 N/A N/A A (cm) : rea 0.066 N/A N/A Volume (cm) : 94.80% N/A N/A % Reduction in A rea: 95.80% N/A N/A % Reduction in Volume: Grade 2 N/A N/A Classification: Medium N/A N/A Exudate A mount: Serosanguineous N/A N/A Exudate Type: red, brown N/A N/A Exudate Color: Small (1-33%) N/A N/A Granulation A mount: Red, Pink N/A N/A Granulation Quality: Large (67-100%) N/A N/A Necrotic A mount: Fat Layer (Subcutaneous Tissue): Yes N/A N/A Exposed Structures: Fascia: No Tendon: No Muscle: No Joint:  No Bone: No None N/A N/A Epithelialization: Debridement - Excisional N/A N/A Debridement: Pre-procedure Verification/Time Out 15:55 N/A N/A Taken: Subcutaneous, Slough N/A N/A Tissue Debrided: Skin/Subcutaneous Tissue N/A N/A Level: 0.16 N/A N/A Debridement A (sq cm): rea Curette N/A N/A Instrument: Moderate N/A N/A Bleeding: Silver Nitrate N/A N/A Hemostasis A chieved: 0 N/A N/A Procedural Pain: 0 N/A N/A Post Procedural Pain: Procedure was tolerated well N/A N/A Debridement Treatment Response: 0.7x0.3x0.4 N/A N/A Post Debridement Measurements L x ISHMEAL, LENTON ([id number]) [id number] Page 5 of 9 W x D (cm) 0.066 N/A N/A Post Debridement Volume: (cm) Debridement N/A N/A Procedures Performed: Treatment Notes Wound #2 (Amputation Site - Toe) Wound Laterality: Right Cleanser Soap and Water Discharge Instruction: Gently cleanse wound with antibacterial soap, rinse and pat dry prior to dressing wounds Peri-Wound Care Topical Primary Dressing Hydrofera Blue Ready Transfer Foam, 2.5x2.5 (in/in) Discharge Instruction: Apply Hydrofera Blue Ready to wound bed as directed Secondary Dressing Gauze Secured With Medipore T - 56M Medipore H Soft Cloth Surgical T ape ape, 2x2 (in/yd) Compression Wrap Compression Stockings Facilities manager) Signed:  10/01/2022 1:36:21 PM By: Elliot Gurney, [name], RN, CWS, [name], [name] Previous Signature: 09/26/2022 11:52:51 AM Version By: Yevonne Pax RN Entered By: Elliot Gurney, [name], RN, CWS, [name] on 10/01/2022 13:36:21 -------------------------------------------------------------------------------- Multi-Disciplinary Care Plan Details Patient Name: Date of Service: [name], [name] [facility] 09/05/2022 3:45 PM Medical Record Number: [id number] Patient Account Number: [id number] Date of Birth/Sex: Treating RN: [birth year] (66 y.o. [name] Primary Care Desmon Hitchner: Ilsa Iha Other Clinician: Referring Ercie Eliasen: Treating Floy Angert/Extender: RO BSO N, MICHA EL Janeth Rase in Treatment: 10 Active Inactive Wound/Skin Impairment Nursing Diagnoses: Impaired tissue integrity Knowledge deficit related to ulceration/compromised skin integrity Goals: Patient/caregiver will verbalize understanding of skin care regimen Date Initiated: 06/27/2022 Target Resolution Date: 09/05/2022 Goal Status: Active Ulcer/skin breakdown will have a volume reduction of 30% by week 4 Date Initiated: 06/27/2022 Date Inactivated: 08/08/2022 Target Resolution Date: 07/28/2022 Unmet Reason: comorbidities / non VINCIL, EELLS ([id number]) [id number] Page 6 of 9 Unmet Reason: comorbidities / non Goal Status: Unmet compliance Ulcer/skin breakdown will have a volume reduction of 50% by week 8 Date Initiated: 06/27/2022 Target Resolution Date: 08/27/2022 Goal Status: Active Ulcer/skin breakdown will have a volume reduction of 80% by week 12 Date Initiated: 06/27/2022 Target Resolution Date: 09/27/2022 Goal Status: Active Ulcer/skin breakdown will heal within 14 weeks Date Initiated: 06/27/2022 Target Resolution Date: 10/11/2022 Goal Status: Active Interventions: Assess patient/caregiver ability to obtain necessary supplies Assess patient/caregiver ability to perform ulcer/skin care regimen upon admission and as  needed Assess ulceration(s) every visit Provide education on ulcer and skin care Treatment Activities: Referred to DME Doc [place] for dressing supplies : 06/27/2022 Skin care regimen initiated : 06/27/2022 Notes: Electronic Signature(s) Signed: 10/01/2022 1:37:18 PM By: Elliot Gurney, [name], RN, CWS, [name], [name] Signed: 10/01/2022 1:52:37 PM By: Yevonne Pax RN Previous Signature: 09/26/2022 11:52:51 AM Version By: Yevonne Pax RN Entered By: Elliot Gurney, [name], RN, CWS, [name] on 10/01/2022 13:37:18 -------------------------------------------------------------------------------- Pain Assessment Details Patient Name: Date of Service: [name] [facility] 09/05/2022 3:45 PM Medical Record Number: [id number] Patient Account Number: [id number] Date of Birth/Sex: Treating RN: March 14, 1956 (66 y.o. [name] Primary Care Jazzelle Zhang: Ilsa Iha Other Clinician: Referring Renika Shiflet: Treating Mckinnley Smithey/Extender: RO BSO N, MICHA EL Janeth Rase in Treatment: 10 Active Problems Location of Pain Severity and Description of Pain Patient Has Paino No Site Locations [place], Greggory Stallion ([id number]) [id number] Page 7 of 9 Pain Management and Medication Current Pain Management:  Electronic Signature(s) Signed: 10/01/2022 1:35:59 PM By: Elliot Gurney, [name], RN, CWS, [name], [name] Signed: 10/01/2022 1:52:37 PM By: Yevonne Pax RN Previous Signature: 09/26/2022 11:52:51 AM Version By: Yevonne Pax RN Entered By: Elliot Gurney [name], RN, CWS, [name] on 10/01/2022 13:35:59 -------------------------------------------------------------------------------- Patient/Caregiver Education Details Patient Name: Date of Service: [name] [facility] 6/5/2024andnbsp3:45 PM Medical Record Number: [id number] Patient Account Number: [id number] Date of Birth/Gender: Treating RN: 04/04/1955 (66 y.o. [name] Primary Care Physician: Ilsa Iha Other Clinician: Referring Physician: Treating Physician/Extender: RO BSO Dorris Carnes, MICHA EL  Janeth Rase in Treatment: 10 Education Assessment Education Provided To: Patient Education Topics Provided Wound/Skin Impairment: Handouts: Caring for Your Ulcer Methods: Explain/Verbal Responses: State content correctly Electronic Signature(s) Signed: 10/01/2022 3:29:09 PM By: Elliot Gurney, [name], RN, CWS, [name], [name] Previous Signature: 09/26/2022 11:52:51 AM Version By: Yevonne Pax RN Entered By: Elliot Gurney, [name], RN, CWS, [name] on 10/01/2022 13:37:26 -------------------------------------------------------------------------------- Wound Assessment Details Patient Name: Date of Service: [name] [facility] 09/05/2022 3:45 PM Medical Record Number: [id number] Patient Account Number: [id number] Date of Birth/Sex: Treating RN: [birth year] (66 y.o. 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 Wound Status: Open Wounding Event: Surgical Injury Comorbid History: Hypertension, Type II Diabetes Date Acquired: 06/03/2022 Weeks Of Treatment: 10 Clustered Wound: No Photos Wound Measurements Length: (cm) 0.7 Width: (cm) 0.3 Depth: (cm) 0.4 Area: (cm) 0.165 Volume: (cm) 0.066 % Reduction in Area: 94.8% % Reduction in Volume: 95.8% Epithelialization: None Tunneling: No Undermining: No Wound Description Classification: Grade 2 Exudate Amount: Medium Exudate Type: Serosanguineous Exudate Color: red, brown Foul Odor After Cleansing: No Slough/Fibrino Yes Wound Bed Granulation Amount: Small (1-33%) Exposed Structure Granulation Quality: Red, Pink Fascia Exposed: No Necrotic Amount: Large (67-100%) Fat Layer (Subcutaneous Tissue) Exposed: Yes Necrotic  Quality: Adherent Slough Tendon Exposed: No Muscle Exposed: No Joint Exposed: No Bone Exposed: No Electronic Signature(s) Signed: 09/26/2022 11:52:51 AM By: Yevonne Pax RN Entered By: Yevonne Pax on 09/05/2022 15:48:04 -------------------------------------------------------------------------------- Vitals Details Patient Name: Date of Service: [name], [name] [facility] 09/05/2022 3:45 PM Medical Record Number: [id number] Patient Account Number: [id number] Date of Birth/Sex: Treating RN: 01-13-1956 (66 y.o. [name] Primary Care Anuel Sitter: Ilsa Iha Other Clinician: Referring Cathan Gearin: Treating Talmage Teaster/Extender: Chauncey Mann, MICHA EL Janeth Rase in Treatment: 10 Vital Signs [name] ([id number]) [id number] Page 9 of 9 Time Taken: 15:45 Temperature (F): 98.3 Height (in): 77 Pulse (bpm): 80 Weight (lbs): 257 Respiratory Rate (breaths/min): 18 Body Mass Index (BMI): 30.5 Blood Pressure (mmHg): 142/74 Reference Range: 80 - 120 mg / dl Electronic Signature(s) Signed: 10/01/2022 1:35:41 PM By: Elliot Gurney, [name], RN, CWS, [name], [name] Previous Signature: 09/26/2022 11:52:51 AM Version By: Yevonne Pax RN Entered By: Elliot Gurney, [name], RN, CWS, [name] on 10/01/2022 13:35:40

## 2022-10-02 NOTE — Progress Notes (Signed)
Robert Levine ([id number]) [id number] Page 1 of 8 Visit Report for 09/05/2022 Debridement Details Patient Name: Date of Service: Robert Levine [facility] 09/05/2022 3:45 PM Medical Record Number: [id number] Patient Account Number: [id number] Date of Birth/Sex: Treating RN: 07/18/1955 (67 y.o. Robert Levine Primary Care Provider: Ilsa Levine Other Clinician: Referring Provider: Treating Provider/Extender: Robert BSO Robert Levine Janeth Rase in Treatment: 10 Debridement Performed for Assessment: Wound #2 Right Amputation Site - Toe Performed By: Physician Robert Caul, MD Debridement Type: Debridement Severity of Tissue Pre Debridement: Fat layer exposed Level of Consciousness (Pre-procedure): Awake and Alert Pre-procedure Verification/Time Out Yes - 15:55 Taken: Start Time: 15:55 Percent of Wound Bed Debrided: 100% T Area Debrided (cm): otal 0.16 Tissue and other material debrided: Viable, Non-Viable, Slough, Subcutaneous, Skin: Dermis , Skin: Epidermis, Slough Level: Skin/Subcutaneous Tissue Debridement Description: Excisional Instrument: Curette Bleeding: Moderate Hemostasis Achieved: Silver Nitrate End Time: 16:00 Procedural Pain: 0 Post Procedural Pain: 0 Response to Treatment: Procedure was tolerated well Level of Consciousness (Post- Awake and Alert procedure): Post Debridement Measurements of Total Wound Length: (cm) 0.7 Width: (cm) 0.3 Depth: (cm) 0.4 Volume: (cm) 0.066 Character of Wound/Ulcer Post Debridement: Improved Severity of Tissue Post Debridement: Fat layer exposed Post Procedure Diagnosis Same as Pre-procedure Electronic Signature(s) Signed: 10/01/2022 1:36:32 PM By: Robert Levine, [name], RN, CWS, [name], [name] Signed: 10/02/2022 3:42:47 PM By: Baltazar Najjar MD Previous Signature: 09/05/2022 5:06:19 PM Version By: Baltazar Najjar MD Entered By: Robert Levine, [name], RN, CWS, Robert Levine on 10/01/2022 13:36:32 [name] ([id number])  [id number] Page 2 of 8 -------------------------------------------------------------------------------- HPI Details Patient Name: Date of Service: Robert Levine [facility] 09/05/2022 3:45 PM Medical Record Number: [id number] Patient Account Number: [id number] Date of Birth/Sex: Treating RN: 11/09/1955 (67 y.o. Robert Levine Primary Care Provider: Ilsa Levine Other Clinician: Referring Provider: Treating Provider/Extender: Robert Levine Janeth Rase in Treatment: 10 History of Present Illness HPI Description: 01-15-2022 patient presents today as a referral from Dr. Rosetta Posner who is a local podiatrist due to a foot wound that is not getting better. The patient unfortunately has had this going on for quite some time he is not necessarily the best historian which makes it a little bit difficult but nonetheless I did review notes as well together where things stand what is going on right now has been using Vaseline over the area according to what he tells me he has had debridements with Dr. Excell Seltzer intermittently. With that being said the patient has not really been using his offloading shoe as much she does have diabetic shoes and he tells me that is primarily what has been wearing. 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 He also has high blood pressure. 01-25-2022 upon evaluation today patient's wound actually showing signs of improvement though there is still little bit of callus and some biofilm and slough buildup on the surface of the wound. I Minna perform sharp debridement to clear this away today. He tells me has been using the shoe a lot of the time though sometimes when he is at the assisted living facility he just walks around with a sock not using the shoe. I explained that he needs to make  sure to always have the shoe on when he is up and moving around as can help this to heal most effectively. 02-01-2022 upon evaluation  today patient appears to be doing well currently in regard to his wound from the standpoint of this not getting worse it does not look to be infected. With that being said unfortunately he does have an open wound that is going to require some sharp debridement today and again I am concerned that if we do not get this closed he is going to end up with this worsening in general. He has not really been wearing the postop shoe with front offloading unfortunately which means that he is not really getting much better. I think that a total contact cast is probably going to be the method in which to try to get this healed as quickly as possible. Again the sooner we get healed lessen the chance that this will develop into a more significant ulceration requiring any type of surgery. We especially would avoid any chance for amputation. 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 Here for application of TCC #1 11/9; obligatory first total contact cast change. The patient had no particular problems. TCC reapplied 02-15-2022 upon evaluation today patient appears to be doing well currently in regard to his wound. There is some callus however buildup that is going to need to be cleared away. Fortunately there does not appear to be any signs of infection this is actually his third cast application today although the first 2 were last week and this is already quite a bit smaller. 02-19-2022 upon evaluation today patient's wound actually is showing signs of doing well with the cast he is actually making good progress. Fortunately I do not see any evidence of infection locally or systemically at this time which is great news overall I think he is on the right track. 02-26-2022 upon evaluation today patient actually appears to be completely healed which is great news. Fortunately I  see no evidence of active infection at this time locally or systemically which is great news as well. 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 He has been treated in our clinic for this issue previously with a total contact cast. He has a podiatrist. He resides in a nursing facility. They have recommended he come in for his wound care. He is fairly unaware of the wound and its condition. 4/10; patient presents for follow-up. He has been using Hydrofera Blue to the wound bed along with his front offloading shoe on the right foot. He has taken his oral antibiotics without issues. 4/24; patient presents for follow-up. He has been using Hydrofera Blue to the wound bed. He has stopped using his front offloading shoe because he thinks it is not helping. He has no issues or complaints today. He would like to follow-up every 2 weeks. 5/8; this is a patient with a wound at the amp site of the right great toe. There is a small horizontal linear wound but surrounded with thick buildup of callus. He is intermittently using his forefoot offloading boot but even with this he probably is not walking in this properly not putting the weight back on his heel I went over this with him. If this does not work he probably needs a total contact cast and I think he has had 1 of these in the past. Not sure  if there is an issue We have been using Hydrofera Blue as the primary dressing 08-16-2022 this is a gentleman that I have previously seen in the clinic although the patient tells me that this is not doing too poorly in fact he feels like that it is doing better. He does have home health coming out. With that being said I do not see any signs of  active infection locally nor systemically at this time which is great news. He does have a tremendous amount of callus however there is no need to be managed. 6/1; we did not have a total contact cast to apply and I think the wound on the plantar right first metatarsal head was about the same. He is using a forefoot off loader and dressing with Hydrofera Blue. Unfortunately he has significant callus around the wound area 5/29; patient presents for follow-up. He has been using Hydrofera Blue to the wound bed. The wound has declined in appearance. We discussed a total contact cast and patient was agreeable to move forward with this at next clinic visit. Robert Levine, Robert Levine ([id number]) [id number] Page 3 of 8 Electronic Signature(s) Signed: 10/01/2022 1:38:06 PM By: Robert Levine, [name], RN, CWS, [name], [name] Signed: 10/02/2022 3:42:47 PM By: Baltazar Najjar MD Previous Signature: 09/05/2022 5:06:19 PM Version By: Baltazar Najjar MD Entered By: Robert Levine [name], RN, CWS, Robert Levine on 10/01/2022 13:38:06 -------------------------------------------------------------------------------- Physical Exam Details Patient Name: Date of Service: Robert Levine [facility] 09/05/2022 3:45 PM Medical Record Number: [id number] Patient Account Number: [id number] Date of Birth/Sex: Treating RN: [birth year] (67 y.o. Robert Levine Primary Care Provider: Ilsa Levine Other Clinician: Referring Provider: Treating Provider/Extender: Robert Levine Janeth Rase in Treatment: 10 Constitutional Sitting or standing Blood Pressure is within target range for patient.. Pulse regular and within target range for patient.Marland Kitchen Respirations regular, non-labored and within target range.. Temperature is normal and within the target range for the patient.Marland Kitchen appears in no distress. Notes Wound exam; the plantar aspect of the right first toe over the first met metatarsal head. He has significant callus around the wound area and a  fissure going towards the first second interspace again surrounded by thick callus. I used a #5 curette to remove this. Significant bleeding controlled with silver nitrate Electronic Signature(s) Signed: 10/01/2022 1:38:15 PM By: Robert Levine, [name], RN, CWS, [name], [name] Signed: 10/02/2022 3:42:47 PM By: Baltazar Najjar MD Previous Signature: 09/05/2022 5:06:19 PM Version By: Baltazar Najjar MD Entered By: Robert Levine, [name], RN, CWS, Robert Levine on 10/01/2022 13:38:15 -------------------------------------------------------------------------------- Physician Orders Details Patient Name: Date of Service: Robert Levine [facility] 09/05/2022 3:45 PM Medical Record Number: [id number] Patient Account Number: [id number] Date of Birth/Sex: Treating RN: [birth year] (66 y.o. Robert Levine Primary Care Provider: Ilsa Levine Other Clinician: Referring Provider: Treating Provider/Extender: Robert BSO Robert Levine Janeth Rase in Treatment: 10 Verbal / Phone Orders: No Diagnosis Coding Follow-up Appointments Return Appointment in 1 week. Home Health Home Health Company: - [facility] for wound care. May utilize formulary equivalent dressing for wound treatment orders unless otherwise specified. Robert Levine, Robert Levine ([id number]) [id number] Page 4 of 8 Home Health Nurse may visit PRN to address patients wound care needs. Aldine Contes [phone] Scheduled days for dressing changes to be completed; exception, patient has scheduled wound care visit that day. **Please direct any NON-WOUND related issues/requests for orders to patient's Primary Care Physician. **If current dressing causes regression in wound condition, may D/C ordered dressing product/s and apply Normal Saline Moist Dressing  daily until next Wound Healing Center or Other MD appointment. **Notify Wound Healing Center of regression in wound condition at [phone]. Bathing/ [employer] wounds with antibacterial soap and  water. Anesthetic (Use 'Patient Medications' Section for Anesthetic Order Entry) Lidocaine applied to wound bed Edema Control - Lymphedema / Segmental Compressive Device / Other Elevate, Exercise Daily and A void Standing for Long Periods of Time. Elevate leg(s) parallel to the floor when sitting. DO YOUR BEST to sleep in the bed at night. DO NOT sleep in your recliner. Long hours of sitting in a recliner leads to swelling of the legs and/or potential wounds on your backside. Off-Loading Total Contact Cast to Right Lower Extremity - for next Wednesday plan to place cast on patient and have him come back Friday to check and re- apply. Other: - Front off loader, please wear this any time you are up on your feet Wound Treatment Wound #2 - Amputation Site - Toe Wound Laterality: Right Cleanser: Soap and Water 1 x Per Day/30 Days Discharge Instructions: Gently cleanse wound with antibacterial soap, rinse and pat dry prior to dressing wounds Prim Dressing: Hydrofera Blue Ready Transfer Foam, 2.5x2.5 (in/in) (Dispense As Written) 1 x Per Day/30 Days ary Discharge Instructions: Apply Hydrofera Blue Ready to wound bed as directed Secondary Dressing: Gauze 1 x Per Day/30 Days Secured With: Medipore T - 1M Medipore H Soft Cloth Surgical T ape ape, 2x2 (in/yd) 1 x Per Day/30 Days Electronic Signature(s) Signed: 10/01/2022 3:29:09 PM By: Robert Levine, [name], RN, CWS, [name], [name] Signed: 10/02/2022 3:42:47 PM By: Baltazar Najjar MD Previous Signature: 09/26/2022 11:52:51 AM Version By: Yevonne Pax RN Entered By: Robert Levine, [name], RN, CWS, Robert Levine on 10/01/2022 13:36:47 -------------------------------------------------------------------------------- Problem List Details Patient Name: Date of Service: Robert Levine [facility] 09/05/2022 3:45 PM Medical Record Number: [id number] Patient Account Number: [id number] Date of Birth/Sex: Treating RN: 1955-08-17 (67 y.o. Robert Levine Primary Care Provider: Ilsa Levine Other  Clinician: Referring Provider: Treating Provider/Extender: Chauncey Mann, MICHA Levine Janeth Rase in Treatment: 10 Active Problems ICD-10 Encounter Code Description Active Date MDM Diagnosis E11.621 Type 2 diabetes mellitus with foot ulcer 06/27/2022 No Yes L97.512 Non-pressure chronic ulcer of other part of right foot with fat layer exposed 06/27/2022 No Yes LAVI, NITKA ([id number]) [id number] Page 5 of 8 Z89.411 Acquired absence of right great toe 06/27/2022 No Yes N18.30 Chronic kidney disease, stage 3 unspecified 06/27/2022 No Yes I10 Essential (primary) hypertension 06/27/2022 No Yes Inactive Problems Resolved Problems Electronic Signature(s) Signed: 10/01/2022 1:37:50 PM By: Robert Levine, [name], RN, CWS, [name], [name] Signed: 10/02/2022 3:42:47 PM By: Baltazar Najjar MD Previous Signature: 09/05/2022 5:06:19 PM Version By: Baltazar Najjar MD Entered By: Robert Levine, [name], RN, CWS, Robert Levine on 10/01/2022 13:37:50 -------------------------------------------------------------------------------- Progress Note Details Patient Name: Date of Service: Robert Levine [facility] 09/05/2022 3:45 PM Medical Record Number: [id number] Patient Account Number: [id number] Date of Birth/Sex: Treating RN: 09-Aug-1955 (66 y.o. Robert Levine Primary Care Provider: Ilsa Levine Other Clinician: Referring Provider: Treating Provider/Extender: Chauncey Mann, MICHA Levine Janeth Rase in Treatment: 10 Subjective History of Present Illness (HPI) 01-15-2022 patient presents today as a referral from Dr. Rosetta Posner who is a local podiatrist due to a foot wound that is not getting better. The patient unfortunately has had this going on for quite some time he is not necessarily the best historian which makes it a little bit difficult but nonetheless I did review notes as well together where things stand what  is going on right now has been using Vaseline over the area according to what he tells me he has  had debridements with Dr. Excell Seltzer intermittently. With that being said the patient has not really been using his offloading shoe as much she does have diabetic shoes and he tells me that is primarily what has been wearing. 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 He also has high blood pressure. 01-25-2022 upon evaluation today patient's wound actually showing signs of improvement though there is still little bit of callus and some biofilm and slough buildup on the surface of the wound. I Minna perform sharp debridement to clear this away today. He tells me has been using the shoe a lot of the time though sometimes when he is at the assisted living facility he just walks around with a sock not using the shoe. I explained that he needs to make sure to always have the shoe on when he is up and moving around as can help this to heal most effectively. 02-01-2022 upon evaluation today patient appears to be doing well currently in regard to his wound from the standpoint of this not getting worse it does not look to be infected. With that being said unfortunately he does have an open wound that is going to require some sharp debridement today and again I am concerned that if we do not get this closed he is going to end up with this worsening in general. He has not really been wearing the postop shoe with front offloading unfortunately which means that he is not really getting much better. I think that a total contact cast is probably going to be the method in which to try to get this healed as quickly as possible. Again the sooner we get healed lessen the chance that this will develop into a more significant ulceration requiring any type of surgery. We especially would avoid any chance for amputation. 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 Here  for application of TCC #1 11/9; obligatory first total contact cast change. The patient had no particular problems. TCC reapplied 02-15-2022 upon evaluation today patient appears to be doing well currently in regard to his wound. There is some callus however buildup that is going to need to be cleared away. Fortunately there does not appear to be any signs of infection this is actually his third cast application today although the first 2 were last week and this is already quite a bit smaller. 02-19-2022 upon evaluation today patient's wound actually is showing signs of doing well with the cast he is actually making good progress. Fortunately I do not see any evidence of infection locally or systemically at this time which is great news overall I think he is on the right track. Robert, Levine ([id number]) [id number] Page 6 of 8 02-26-2022 upon evaluation today patient actually appears to be completely healed which is great news. Fortunately I see no evidence of active infection at this time locally or systemically which is great news as well. 06/27/2022 Mr. 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 He has been treated  in our clinic for this issue previously with a total contact cast. He has a podiatrist. He resides in a nursing facility. They have recommended he come in for his wound care. He is fairly unaware of the wound and its condition. 4/10; patient presents for follow-up. He has been using Hydrofera Blue to the wound bed along with his front offloading shoe on the right foot. He has taken his oral antibiotics without issues. 4/24; patient presents for follow-up. He has been using Hydrofera Blue to  the wound bed. He has stopped using his front offloading shoe because he thinks it is not helping. He has no issues or complaints today. He would like to follow-up every 2 weeks. 5/8; this is a patient with a wound at the amp site of the right great toe. There is a small horizontal linear wound but surrounded with thick buildup of callus. He is intermittently using his forefoot offloading boot but even with this he probably is not walking in this properly not putting the weight back on his heel I went over this with him. If this does not work he probably needs a total contact cast and I think he has had 1 of these in the past. Not sure if there is an issue We have been using Hydrofera Blue as the primary dressing 08-16-2022 this is a gentleman that I have previously seen in the clinic although the patient tells me that this is not doing too poorly in fact he feels like that it is doing better. He does have home health coming out. With that being said I do not see any signs of active infection locally nor systemically at this time which is great news. He does have a tremendous amount of callus however there is no need to be managed. 6/1; we did not have a total contact cast to apply and I think the wound on the plantar right first metatarsal head was about the same. He is using a forefoot off loader and dressing with Hydrofera Blue. Unfortunately he has significant callus around the wound area 5/29; patient presents for follow-up. He has been using Hydrofera Blue to the wound bed. The wound has declined in appearance. We discussed a total contact cast and patient was agreeable to move forward with this at next clinic visit. Objective Constitutional Sitting or standing Blood Pressure is within target range for patient.. Pulse regular and within target range for patient.Marland Kitchen Respirations regular, non-labored and within target range.. Temperature is normal and within the target range for the patient.Marland Kitchen  appears in no distress. Vitals Time Taken: 3:45 PM, Height: 77 in, Weight: 257 lbs, BMI: 30.5, Temperature: 98.3 F, Pulse: 80 bpm, Respiratory Rate: 18 breaths/min, Blood Pressure: 142/74 mmHg. General Notes: Wound exam; the plantar aspect of the right first toe over the first met metatarsal head. He has significant callus around the wound area and a fissure going towards the first second interspace again surrounded by thick callus. I used a #5 curette to remove this. Significant bleeding controlled with silver nitrate Integumentary (Hair, Skin) Wound #2 status is Open. Original cause of wound was Surgical Injury. The date acquired was: 06/03/2022. The wound has been in treatment 10 weeks. The wound is located on the Right Amputation Site - T The wound measures 0.7cm length x 0.3cm width x 0.4cm depth; 0.165cm^2 area and 0.066cm^3 volume. oe. There is Fat Layer (Subcutaneous Tissue) exposed. There is no tunneling or undermining noted. There is a medium amount of serosanguineous drainage noted.  There is small (1-33%) red, pink granulation within the wound bed. There is a large (67-100%) amount of necrotic tissue within the wound bed including Adherent Slough. 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 There was a Excisional Skin/Subcutaneous Tissue Debridement with a total area of 0.16 sq cm performed by Robert Caul, MD. With the following instrument(s): Curette to remove Viable and Non-Viable tissue/material. Material removed includes Subcutaneous Tissue, Slough,  Skin: Dermis, and Skin: Epidermis. No specimens were taken. A time out was conducted at 15:55, prior to the start of the procedure. A Moderate Robert Levine, Robert Levine ([id number]) [id number] Page 7 of 8 amount of bleeding was controlled with Silver Nitrate. The procedure was tolerated well with a pain level of 0 throughout and a pain level of 0 following the procedure. Post Debridement Measurements: 0.7cm length x 0.3cm width x 0.4cm depth; 0.066cm^3 volume. Character of Wound/Ulcer Post Debridement is improved. Severity of Tissue Post Debridement is: Fat layer exposed. Post procedure Diagnosis Wound #2: Same as Pre-Procedure Plan Follow-up Appointments: Return Appointment in 1 week. Home Health: Home Health Company: - [facility] for wound care. May utilize formulary equivalent dressing for wound treatment orders unless otherwise specified. Home Health Nurse may visit PRN to address patients wound care needs. Aldine Contes [phone] Scheduled days for dressing changes to be completed; exception, patient has scheduled wound care visit that day. **Please direct any NON-WOUND related issues/requests for orders to patient's Primary Care Physician. **If current dressing causes regression in wound condition, may D/C ordered dressing product/s and apply Normal Saline Moist Dressing daily until next Wound Healing Center or Other MD appointment. **Notify Wound Healing Center of regression in wound condition at [phone]. Bathing/ Shower/ Hygiene: Wash wounds with antibacterial soap and water. Anesthetic (Use 'Patient Medications' Section for Anesthetic Order Entry): Lidocaine applied to wound bed Edema Control - Lymphedema / Segmental Compressive Device / Other: Elevate, Exercise Daily and Avoid Standing for Long Periods of Time. Elevate leg(s) parallel to the floor when sitting. DO YOUR BEST to sleep in the bed at night. DO NOT sleep in your recliner. Long  hours of sitting in a recliner leads to swelling of the legs and/or potential wounds on your backside. Off-Loading: T Contact Cast to Right Lower Extremity - for next Wednesday plan to place cast on patient and have him come back Friday to check and re-apply. otal Other: - Front off loader, please wear this any time you are up on your feet WOUND #2: - Amputation Site - T oe Wound Laterality: Right Cleanser: Soap and Water 1 x Per Day/30 Days Discharge Instructions: Gently cleanse wound with antibacterial soap, rinse and pat dry prior to dressing wounds Prim Dressing: Hydrofera Blue Ready Transfer Foam, 2.5x2.5 (in/in) (Dispense As Written) 1 x Per Day/30 Days ary Discharge Instructions: Apply Hydrofera Blue Ready to wound bed as directed Secondary Dressing: Gauze 1 x Per Day/30 Days Secured With: Medipore T - 11M Medipore H Soft Cloth Surgical T ape ape, 2x2 (in/yd) 1 x Per Day/30 Days 1. I am continuing with the [facility] with a forefoot off loader. 2. Consider for  a total contact cast next week 3. Removing the callus reveals several superficial areas but I cannot remove enough to really unroofed the fissure distally. Electronic Signature(s) Signed: 10/01/2022 1:39:16 PM By: Robert Levine, [name], RN, CWS, [name], [name] Signed: 10/02/2022 3:42:47 PM By: Baltazar Najjar MD Previous Signature: 09/05/2022 5:06:19 PM Version By: Baltazar Najjar MD Entered By: Robert Levine [name], RN, CWS, Robert Levine on 10/01/2022 13:39:16 -------------------------------------------------------------------------------- SuperBill Details Patient Name: Date of Service: Robert Levine, Robert Levine 09/05/2022 Medical Record Number: [id number] Patient Account Number: [id number] Date of Birth/Sex: Treating RN: 05-01-1955 (67 y.o. 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.30 Chronic kidney disease, stage 3 unspecified I10 Essential (primary) hypertension Facility Procedures : CPT4 Code: [id number] Description: 11042 - DEB SUBQ TISSUE 20 SQ CM/< ICD-10 Diagnosis Description E11.621 Type 2 diabetes mellitus with foot ulcer L97.512 Non-pressure chronic ulcer of other part of right foot with fat layer exposed Modifier: Quantity: 1 Physician Procedures : CPT4 Code Description Modifier [id number] 11042 - WC PHYS SUBQ TISS 20 SQ CM ICD-10 Diagnosis Description E11.621 Type 2 diabetes mellitus with foot ulcer L97.512 Non-pressure chronic ulcer of other part of right foot with fat layer exposed Quantity: 1 Electronic Signature(s) Signed: 10/01/2022 1:37:08 PM By: Robert Levine, [name], RN, CWS, [name], [name] Signed: 10/02/2022 3:42:47 PM By: Baltazar Najjar MD Previous Signature: 09/05/2022 5:06:19 PM Version By: Baltazar Najjar MD Entered By: Robert Levine, [name], RN, CWS, Robert Levine on 10/01/2022 13:37:07

## 2022-10-03 ENCOUNTER — Encounter: Payer: Medicare Other | Attending: Internal Medicine | Admitting: Internal Medicine

## 2022-10-03 DIAGNOSIS — L97512 Non-pressure chronic ulcer of other part of right foot with fat layer exposed: Secondary | ICD-10-CM | POA: Diagnosis not present

## 2022-10-03 DIAGNOSIS — E11621 Type 2 diabetes mellitus with foot ulcer: Secondary | ICD-10-CM | POA: Diagnosis present

## 2022-10-03 DIAGNOSIS — E1142 Type 2 diabetes mellitus with diabetic polyneuropathy: Secondary | ICD-10-CM | POA: Insufficient documentation

## 2022-10-03 DIAGNOSIS — E1122 Type 2 diabetes mellitus with diabetic chronic kidney disease: Secondary | ICD-10-CM | POA: Insufficient documentation

## 2022-10-03 DIAGNOSIS — Z89411 Acquired absence of right great toe: Secondary | ICD-10-CM | POA: Diagnosis not present

## 2022-10-03 DIAGNOSIS — I129 Hypertensive chronic kidney disease with stage 1 through stage 4 chronic kidney disease, or unspecified chronic kidney disease: Secondary | ICD-10-CM | POA: Insufficient documentation

## 2022-10-03 DIAGNOSIS — N183 Chronic kidney disease, stage 3 unspecified: Secondary | ICD-10-CM | POA: Insufficient documentation

## 2022-10-03 DIAGNOSIS — Z794 Long term (current) use of insulin: Secondary | ICD-10-CM | POA: Insufficient documentation

## 2022-10-03 NOTE — Progress Notes (Signed)
Robert, Levine ([id number]) [id number] Page 1 of 7 Visit Report for 10/03/2022 Chief Complaint Document Details Patient Name: Date of Service: Robert Levine [facility] 10/03/2022 10:15 A M Medical Record Number: [id number] Patient Account Number: [id number] Date of Birth/Sex: Treating RN: 30-Jul-1955 (66 y.o. Robert Levine) Yevonne Pax Primary Care Provider: Ilsa Iha Other Clinician: Referring Provider: Treating Provider/Extender: Ammie Ferrier in Treatment: 14 Information Obtained from: Patient Chief Complaint 06/27/2022; Right great toe ulcer at amputation site Electronic Signature(s) Signed: 10/03/2022 4:13:50 PM By: Geralyn Corwin DO Entered By: Geralyn Corwin on 10/03/2022 10:23:04 -------------------------------------------------------------------------------- HPI Details Patient Name: Date of Service: Robert Levine 10/03/2022 10:15 A M Medical Record Number: [id number] Patient Account Number: [id number] Date of Birth/Sex: Treating RN: 1955-11-24 (66 y.o. Robert Levine Primary Care Provider: Ilsa Iha Other Clinician: Referring Provider: Treating Provider/Extender: Ammie Ferrier in Treatment: 14 History of Present Illness HPI Description: 01-15-2022 patient presents today as a referral from Dr. Rosetta Posner who is a local podiatrist due to a foot wound that is not getting better. The patient unfortunately has had this going on for quite some time he is not necessarily the best historian which makes it a little bit difficult but nonetheless I did review notes as well together where things stand what is going on right now has been using Vaseline over the area according to what he tells me he has had debridements with Dr. Excell Seltzer intermittently. With that being said the patient has not really been using his offloading shoe as much she does have diabetic shoes and he tells me that is primarily what has been  wearing. 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 He also has high blood pressure. 01-25-2022 upon evaluation today patient's wound actually showing signs of improvement though there is still little bit of callus and some biofilm and slough buildup on the surface of the wound. I Minna perform sharp debridement to clear this away today. He tells me has been using the shoe a lot of the time though sometimes when he is at the assisted living facility he just walks around with a sock not using the shoe. I explained that he needs to make sure to always have the shoe on when he is up and moving around as can help this to heal most effectively. 02-01-2022 upon evaluation today patient appears to be doing well currently in regard to his wound from the standpoint of this not getting worse it does not look to be infected. With that being said unfortunately he does have an open wound that is going to require some sharp debridement today and again I am concerned that if we do not get this closed he is going to end up with this worsening in general. He has not really been wearing the postop shoe with front [name], [name] ([id number]) [id number] Page 2 of 7 offloading unfortunately which means that he is not really getting much better. I think that a total contact cast is probably going to be the method in which to try to get this healed as quickly as possible. Again the sooner we get healed lessen the chance that this will develop into a more significant ulceration requiring any type of surgery. We especially would avoid any chance for amputation. 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 Here for application of TCC #  1 11/9; obligatory first total contact cast change. The patient had no particular problems. TCC  reapplied 02-15-2022 upon evaluation today patient appears to be doing well currently in regard to his wound. There is some callus however buildup that is going to need to be cleared away. Fortunately there does not appear to be any signs of infection this is actually his third cast application today although the first 2 were last week and this is already quite a bit smaller. 02-19-2022 upon evaluation today patient's wound actually is showing signs of doing well with the cast he is actually making good progress. Fortunately I do not see any evidence of infection locally or systemically at this time which is great news overall I think he is on the right track. 02-26-2022 upon evaluation today patient actually appears to be completely healed which is great news. Fortunately I see no evidence of active infection at this time locally or systemically which is great news as well. 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 He has been treated in our clinic for this issue previously with a total contact cast. He has a podiatrist. He resides in a nursing facility. They have recommended he come in for his wound care. He is fairly unaware of the wound and its condition. 4/10; patient presents for follow-up. He has been using Hydrofera Blue to the wound bed along with his front offloading shoe on the right foot. He has taken his oral antibiotics without issues. 4/24; patient presents for follow-up. He has been using Hydrofera Blue to the wound bed. He has stopped using his front offloading shoe because he thinks it is not helping. He has no issues or complaints today. He would like to follow-up every 2 weeks. 5/8; this is a patient  with a wound at the amp site of the right great toe. There is a small horizontal linear wound but surrounded with thick buildup of callus. He is intermittently using his forefoot offloading boot but even with this he probably is not walking in this properly not putting the weight back on his heel I went over this with him. If this does not work he probably needs a total contact cast and I think he has had 1 of these in the past. Not sure if there is an issue We have been using Hydrofera Blue as the primary dressing 08-16-2022 this is a gentleman that I have previously seen in the clinic although the patient tells me that this is not doing too poorly in fact he feels like that it is doing better. He does have home health coming out. With that being said I do not see any signs of active infection locally nor systemically at this time which is great news. He does have a tremendous amount of callus however there is no need to be managed. 6/1; we did not have a total contact cast to apply and I think the wound on the plantar right first metatarsal head was about the same. He is using a forefoot off loader and dressing with Hydrofera Blue. Unfortunately he has significant callus around the wound area 5/29; patient presents for follow-up. He has been using Hydrofera Blue to the wound bed. The wound has declined in appearance.  We discussed a total contact cast and patient was agreeable to move forward with this at next clinic visit. 6/12; patient presents for follow-up. He is ready for the total contact cast placement today. Wound is overall stable from last visit. No signs of infection. 09-14-2022 upon evaluation today patient appears to be doing well currently in regard to his wound after 2 days he is already showing signs of improvement which is great news. Fortunately I do not see any signs of active infection locally nor systemically at this time. 09-20-2022 upon evaluation patient's wound bed actually  showed signs of good granulation epithelization this is significantly smaller he still has a lot of callus around the edges of the wound and will perform debridement to clear this away. 6/26; patient presents for follow-up. We have been using Hydrofera Blue under the total contact cast to the right lower foot wound. His wound is smaller. He is tolerated the total contact cast well. 7/3; patient presents for follow-up. We have been using Hydrofera Blue under the total contact cast to the right lower extremity. His right foot wound has healed. Electronic Signature(s) Signed: 10/03/2022 4:13:50 PM By: Geralyn Corwin DO Entered By: Geralyn Corwin on 10/03/2022 10:23:35 -------------------------------------------------------------------------------- Physical Exam Details Patient Name: Date of Service: Robert Levine [facility] 10/03/2022 10:15 A M Medical Record Number: [id number] Patient Account Number: [id number] Date of Birth/Sex: Treating RN: 08-12-1955 (66 y.o. Robert Levine) Yevonne Pax Primary Care Provider: Ilsa Iha Other Clinician: Referring Provider: Treating Provider/Extender: Ammie Ferrier in Treatment: [address], Greggory Stallion ([id number]) [id number] Page 3 of 7 Constitutional . Cardiovascular . Psychiatric . Notes Previous right great toe amputation. T the plantar aspect of the previous amputation site there is Epithelization to the previous wound site. No signs of o surrounding infection Including increased warmth, erythema or purulent drainage. Electronic Signature(s) Signed: 10/03/2022 4:13:50 PM By: Geralyn Corwin DO Entered By: Geralyn Corwin on 10/03/2022 10:24:04 -------------------------------------------------------------------------------- Physician Orders Details Patient Name: Date of Service: Robert Fraise, [name] 10/03/2022 10:15 A M Medical Record Number: [id number] Patient Account Number: [id number] Date of Birth/Sex: Treating  RN: 02-13-1956 (66 y.o. Robert Levine) Yevonne Pax Primary Care Provider: Ilsa Iha Other Clinician: Referring Provider: Treating Provider/Extender: Ammie Ferrier in Treatment: 14 Verbal / Phone Orders: No Diagnosis Coding ICD-10 Coding Code Description E11.621 Type 2 diabetes mellitus with foot ulcer L97.512 Non-pressure chronic ulcer of other part of right foot with fat layer exposed Z89.411 Acquired absence of right great toe N18.30 Chronic kidney disease, stage 3 unspecified I10 Essential (primary) hypertension Discharge From [facility] Services Confirmation of Closure Visit (2 weeks) - patient to wear front off loading shoe to right foot until return in 2 weeks Wound Treatment Electronic Signature(s) Signed: 10/03/2022 10:53:19 AM By: Yevonne Pax RN Signed: 10/03/2022 4:13:50 PM By: Geralyn Corwin DO Previous Signature: 10/03/2022 10:34:38 AM Version By: Yevonne Pax RN Entered By: Yevonne Pax on 10/03/2022 10:53:19 [name] ([id number]) [id number] Page 4 of 7 -------------------------------------------------------------------------------- Problem List Details Patient Name: Date of Service: Robert Levine [facility] 10/03/2022 10:15 A M Medical Record Number: [id number] Patient Account Number: [id number] Date of Birth/Sex: Treating RN: 03/25/1956 (66 y.o. Robert Levine) Yevonne Pax Primary Care Provider: Ilsa Iha Other Clinician: Referring Provider: Treating Provider/Extender: Ammie Ferrier in Treatment: 14 Active Problems ICD-10 Encounter Code Description Active Date MDM Diagnosis E11.621 Type 2 diabetes mellitus with foot ulcer 06/27/2022 No Yes L97.512 Non-pressure chronic ulcer of other part of right foot with fat layer exposed 06/27/2022 No  Yes Z89.411 Acquired absence of right great toe 06/27/2022 No Yes N18.30 Chronic kidney disease, stage 3 unspecified 06/27/2022 No Yes I10 Essential (primary) hypertension  06/27/2022 No Yes Inactive Problems Resolved Problems Electronic Signature(s) Signed: 10/03/2022 4:13:50 PM By: Geralyn Corwin DO Entered By: Geralyn Corwin on 10/03/2022 10:23:00 -------------------------------------------------------------------------------- Progress Note Details Patient Name: Date of Service: Robert Levine 10/03/2022 10:15 A M Medical Record Number: [id number] Patient Account Number: [id number] Date of Birth/Sex: Treating RN: 1955-06-12 (66 y.o. Robert Levine Primary Care Provider: Ilsa Iha Other Clinician: [name] ([id number]) [id number] Page 5 of 7 Referring Provider: Treating Provider/Extender: Ammie Ferrier in Treatment: 14 Subjective Chief Complaint Information obtained from Patient 06/27/2022; Right great toe ulcer at amputation site History of Present Illness (HPI) 01-15-2022 patient presents today as a referral from Dr. Rosetta Posner who is a local podiatrist due to a foot wound that is not getting better. The patient unfortunately has had this going on for quite some time he is not necessarily the best historian which makes it a little bit difficult but nonetheless I did review notes as well together where things stand what is going on right now has been using Vaseline over the area according to what he tells me he has had debridements with Dr. Excell Seltzer intermittently. With that being said the patient has not really been using his offloading shoe as much she does have diabetic shoes and he tells me that is primarily what has been wearing. 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 He also has high blood pressure. 01-25-2022 upon evaluation today patient's wound actually showing signs of improvement though there is still little bit of callus  and some biofilm and slough buildup on the surface of the wound. I Minna perform sharp debridement to clear this away today. He tells me has been using the shoe a lot of the time though sometimes when he is at the assisted living facility he just walks around with a sock not using the shoe. I explained that he needs to make sure to always have the shoe on when he is up and moving around as can help this to heal most effectively. 02-01-2022 upon evaluation today patient appears to be doing well currently in regard to his wound from the standpoint of this not getting worse it does not look to be infected. With that being said unfortunately he does have an open wound that is going to require some sharp debridement today and again I am concerned that if we do not get this closed he is going to end up with this worsening in general. He has not really been wearing the postop shoe with front offloading unfortunately which means that he is not really getting much better. I think that a total contact cast is probably going to be the method in which to try to get this healed as quickly as possible. Again the sooner we get healed lessen the chance that this will develop into a more significant ulceration requiring any type of surgery. We especially would avoid any chance for amputation. 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 Here for application of TCC #1 11/9; obligatory first total contact cast change. The patient had no particular problems. TCC reapplied 02-15-2022 upon evaluation today patient appears to be doing well currently in regard to his wound. There  is some callus however buildup that is going to need to be cleared away. Fortunately there does not appear to be any signs of infection this is actually his third cast application today although the first 2 were last week and this is already quite a bit smaller. 02-19-2022 upon evaluation today patient's wound actually is showing signs of doing  well with the cast he is actually making good progress. Fortunately I do not see any evidence of infection locally or systemically at this time which is great news overall I think he is on the right track. 02-26-2022 upon evaluation today patient actually appears to be completely healed which is great news. Fortunately I see no evidence of active infection at this time locally or systemically which is great news as well. 06/27/2022 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 He has been treated in our clinic for this issue previously with a total contact cast. He has a podiatrist. He resides in a nursing facility. They have recommended he come in for his wound care. He is fairly unaware of the wound and its condition. 4/10; patient presents for follow-up. He has been using Hydrofera Blue to the wound bed along with his front offloading shoe on the right foot. He has taken his oral antibiotics without issues. 4/24; patient presents for follow-up. He has been using Hydrofera Blue to the wound bed. He has stopped using his front offloading shoe because he thinks it is not helping. He has no issues or complaints today. He would like to follow-up every 2 weeks. 5/8; this is a patient with a wound at the amp site of the right great toe. There is a small horizontal linear wound but surrounded with thick buildup of callus. He is intermittently using his forefoot offloading boot but even with this he probably is not walking in this properly not putting the weight back on his heel I went over this with him. If this does not work he probably needs a total contact cast and I think he has had 1 of these in the past. Not sure if there is an  issue We have been using Hydrofera Blue as the primary dressing 08-16-2022 this is a gentleman that I have previously seen in the clinic although the patient tells me that this is not doing too poorly in fact he feels like that it is doing better. He does have home health coming out. With that being said I do not see any signs of active infection locally nor systemically at this time which is great news. He does have a tremendous amount of callus however there is no need to be managed. 6/1; we did not have a total contact cast to apply and I think the wound on the plantar right first metatarsal head was about the same. He is using a forefoot off loader and dressing with Hydrofera Blue. Unfortunately he has significant callus around the wound area 5/29; patient presents for follow-up. He has been using Hydrofera Blue to the wound bed. The wound has declined in appearance. We discussed a total contact cast and patient was agreeable to move forward with this at next clinic visit. 6/12; patient presents for follow-up. He is ready for the total contact cast placement  today. Wound is overall stable from last visit. No signs of infection. 09-14-2022 upon evaluation today patient appears to be doing well currently in regard to his wound after 2 days he is already showing signs of improvement which is great news. Fortunately I do not see any signs of active infection locally nor systemically at this time. 09-20-2022 upon evaluation patient's wound bed actually showed signs of good granulation epithelization this is significantly smaller he still has a lot of callus around the edges of the wound and will perform debridement to clear this away. 6/26; patient presents for follow-up. We have been using Hydrofera Blue under the total contact cast to the right lower foot wound. His wound is smaller. He is tolerated the total contact cast well. 7/3; patient presents for follow-up. We have been using Hydrofera Blue  under the total contact cast to the right lower extremity. His right foot wound has healed. Robert Levine, Robert Levine ([id number]) [id number] Page 6 of 7 Objective Constitutional Vitals Time Taken: 10:09 AM, Height: 77 in, Weight: 257 lbs, BMI: 30.5, Temperature: 98.1 F, Pulse: 71 bpm, Respiratory Rate: 18 breaths/min, Blood Pressure: 151/72 mmHg. General Notes: Previous right great toe amputation. T the plantar aspect of the previous amputation site there is Epithelization to the previous wound site. No o signs of surrounding infection Including increased warmth, erythema or purulent drainage. Integumentary (Hair, Skin) Wound #2 status is Open. Original cause of wound was Surgical Injury. The date acquired was: 06/03/2022. The wound has been in treatment 14 weeks. The wound is located on the Right Amputation Site - T The wound measures 0cm length x 0cm width x 0cm depth; 0cm^2 area and 0cm^3 volume. There is no oe. tunneling or undermining noted. There is a medium amount of serosanguineous drainage noted. There is no granulation within the wound bed. There is no necrotic tissue within the wound bed. 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 His wound is healed. I recommended he wear his front offloading shoe for the next week. He can go into his diabetic shoes After this. Inspect feet daily. We will see him back in 2 weeks to assure that the wound did not reopen. Plan Discharge From [facility] Services: Confirmation of Closure Visit (2 weeks) - patient to wear front off loading shoe to right foot until return in 2 weeks 1. Follow-up in 2 weeks 2. Front offloading shoe for the next week then can transfer to  diabetic shoes Electronic Signature(s) Signed: 10/03/2022 4:13:50 PM By: Geralyn Corwin DO Entered By: Geralyn Corwin on 10/03/2022 10:49:57 -------------------------------------------------------------------------------- SuperBill Details Patient Name: Date of Service: Robert Fraise, [name] 10/03/2022 Medical Record Number: [id number] Patient Account Number: [id number] Date of Birth/Sex: Treating RN: 1956/01/24 (66 y.o. Robert Levine Primary Care Provider: Ilsa Iha Other Clinician: Referring Provider: Treating Provider/Extender: Kellie Moor [place], Greggory Stallion ([id number]) [id number] Page 7 of 7 Weeks in Treatment: 14 Diagnosis Coding ICD-10 Codes Code Description E11.621 Type 2 diabetes mellitus with foot ulcer L97.512 Non-pressure chronic ulcer of other part of right foot with fat layer exposed Z89.411 Acquired absence of right great toe N18.30 Chronic kidney disease, stage 3 unspecified I10 Essential (primary) hypertension Facility Procedures : CPT4 Code: [id number] Description: 96295 - WOUND CARE VISIT-LEV 2 EST PT Modifier: Quantity: 1  Physician Procedures : CPT4 Code Description Modifier [phone] [id number] - WC PHYS LEVEL 3 - EST PT ICD-10 Diagnosis Description L97.512 Non-pressure chronic ulcer of other part of right foot with fat layer exposed E11.621 Type 2 diabetes mellitus with foot ulcer Z89.411 Acquired  absence of right great toe Quantity: 1 Electronic Signature(s) Signed: 10/03/2022 10:32:39 AM By: Yevonne Pax RN Signed: 10/03/2022 4:13:50 PM By: Geralyn Corwin DO Entered By: Yevonne Pax on 10/03/2022 10:32:39

## 2022-10-17 ENCOUNTER — Encounter (HOSPITAL_BASED_OUTPATIENT_CLINIC_OR_DEPARTMENT_OTHER): Payer: Medicare Other | Admitting: Internal Medicine

## 2022-10-17 DIAGNOSIS — N183 Chronic kidney disease, stage 3 unspecified: Secondary | ICD-10-CM | POA: Diagnosis not present

## 2022-10-17 DIAGNOSIS — L97512 Non-pressure chronic ulcer of other part of right foot with fat layer exposed: Secondary | ICD-10-CM | POA: Diagnosis not present

## 2022-10-17 DIAGNOSIS — Z89411 Acquired absence of right great toe: Secondary | ICD-10-CM

## 2022-10-17 DIAGNOSIS — E11621 Type 2 diabetes mellitus with foot ulcer: Secondary | ICD-10-CM

## 2022-10-18 NOTE — Progress Notes (Signed)
Robert Levine, Robert Levine ([id number]) [id number] Page 1 of 8 Visit Report for 10/17/2022 Chief Complaint Document Details Patient Name: Date of Service: Robert Levine [facility] 10/17/2022 8:30 A M Medical Record Number: [id number] Patient Account Number: [id number] Date of Birth/Sex: Treating RN: 1956-01-11 (67 y.o. Robert Levine Primary Care Provider: Ilsa Iha Other Clinician: Referring Provider: Treating Provider/Extender: Ammie Ferrier in Treatment: 16 Information Obtained from: Patient Chief Complaint 06/27/2022; Right great toe ulcer at amputation site Electronic Signature(s) Signed: 10/17/2022 1:39:39 PM By: Geralyn Corwin DO Entered By: Geralyn Corwin on 10/17/2022 09:33:50 -------------------------------------------------------------------------------- HPI Details Patient Name: Date of Service: Robert Levine, [name] 10/17/2022 8:30 A M Medical Record Number: [id number] Patient Account Number: [id number] Date of Birth/Sex: Treating RN: 07-14-1955 (67 y.o. Robert Levine Primary Care Provider: Ilsa Iha Other Clinician: Referring Provider: Treating Provider/Extender: Ammie Ferrier in Treatment: 16 History of Present Illness HPI Description: 01-15-2022 patient presents today as a referral from Dr. Rosetta Posner who is a local podiatrist due to a foot wound that is not getting better. The patient unfortunately has had this going on for quite some time he is not necessarily the best historian which makes it a little bit difficult but nonetheless I did review notes as well together where things stand what is going on right now has been using Vaseline over the area according to what he tells me he has had debridements with Dr. Excell Seltzer intermittently. With that being said the patient has not really been using his offloading shoe as much she does have diabetic shoes and he tells me that is primarily what has been  wearing. 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 He also has high blood pressure. 01-25-2022 upon evaluation today patient's wound actually showing signs of improvement though there is still little bit of callus and some biofilm and slough buildup on the surface of the wound. I Minna perform sharp debridement to clear this away today. He tells me has been using the shoe a lot of the time though sometimes when he is at the assisted living facility he just walks around with a sock not using the shoe. I explained that he needs to make sure to always have the shoe on when he is up and moving around as can help this to heal most effectively. 02-01-2022 upon evaluation today patient appears to be doing well currently in regard to his wound from the standpoint of this not getting worse it does not look to be infected. With that being said unfortunately he does have an open wound that is going to require some sharp debridement today and again I am concerned that if we do not get this closed he is going to end up with this worsening in general. He has not really been wearing the postop shoe with front Robert Levine, Robert Levine ([id number]) [id number] Page 2 of 8 offloading unfortunately which means that he is not really getting much better. I think that a total contact cast is probably going to be the method in which to try to get this healed as quickly as possible. Again the sooner we get healed lessen the chance that this will develop into a more significant ulceration requiring any type of surgery. We especially would avoid any chance for amputation. 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 Here for application of TCC #  1 11/9; obligatory first total contact cast change. The patient had no particular problems. TCC  reapplied 02-15-2022 upon evaluation today patient appears to be doing well currently in regard to his wound. There is some callus however buildup that is going to need to be cleared away. Fortunately there does not appear to be any signs of infection this is actually his third cast application today although the first 2 were last week and this is already quite a bit smaller. 02-19-2022 upon evaluation today patient's wound actually is showing signs of doing well with the cast he is actually making good progress. Fortunately I do not see any evidence of infection locally or systemically at this time which is great news overall I think he is on the right track. 02-26-2022 upon evaluation today patient actually appears to be completely healed which is great news. Fortunately I see no evidence of active infection at this time locally or systemically which is great news as well. 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 He has been treated in our clinic for this issue previously with a total contact cast. He has a podiatrist. He resides in a nursing facility. They have recommended he come in for his wound care. He is fairly unaware of the wound and its condition. 4/10; patient presents for follow-up. He has been using Hydrofera Blue to the wound bed along with his front offloading shoe on the right foot. He has taken his oral antibiotics without issues. 4/24; patient presents for follow-up. He has been using Hydrofera Blue to the wound bed. He has stopped using his front offloading shoe because he thinks it is not helping. He has no issues or complaints today. He would like to follow-up every 2 weeks. 5/8; this is a patient  with a wound at the amp site of the right great toe. There is a small horizontal linear wound but surrounded with thick buildup of callus. He is intermittently using his forefoot offloading boot but even with this he probably is not walking in this properly not putting the weight back on his heel I went over this with him. If this does not work he probably needs a total contact cast and I think he has had 1 of these in the past. Not sure if there is an issue We have been using Hydrofera Blue as the primary dressing 08-16-2022 this is a gentleman that I have previously seen in the clinic although the patient tells me that this is not doing too poorly in fact he feels like that it is doing better. He does have home health coming out. With that being said I do not see any signs of active infection locally nor systemically at this time which is great news. He does have a tremendous amount of callus however there is no need to be managed. 6/1; we did not have a total contact cast to apply and I think the wound on the plantar right first metatarsal head was about the same. He is using a forefoot off loader and dressing with Hydrofera Blue. Unfortunately he has significant callus around the wound area 5/29; patient presents for follow-up. He has been using Hydrofera Blue to the wound bed. The wound has declined in appearance.  We discussed a total contact cast and patient was agreeable to move forward with this at next clinic visit. 6/12; patient presents for follow-up. He is ready for the total contact cast placement today. Wound is overall stable from last visit. No signs of infection. 09-14-2022 upon evaluation today patient appears to be doing well currently in regard to his wound after 2 days he is already showing signs of improvement which is great news. Fortunately I do not see any signs of active infection locally nor systemically at this time. 09-20-2022 upon evaluation patient's wound bed actually  showed signs of good granulation epithelization this is significantly smaller he still has a lot of callus around the edges of the wound and will perform debridement to clear this away. 6/26; patient presents for follow-up. We have been using Hydrofera Blue under the total contact cast to the right lower foot wound. His wound is smaller. He is tolerated the total contact cast well. 7/3; patient presents for follow-up. We have been using Hydrofera Blue under the total contact cast to the right lower extremity. His right foot wound has healed. 7/17; patient presents for 2-week follow-up to assure that the wound on the right foot has remained closed. No open wounds today. Electronic Signature(s) Signed: 10/17/2022 1:39:39 PM By: Geralyn Corwin DO Entered By: Geralyn Corwin on 10/17/2022 09:34:55 -------------------------------------------------------------------------------- Physical Exam Details Patient Name: Date of Service: Robert Levine, [name] [facility] 10/17/2022 8:30 A M Medical Record Number: [id number] Patient Account Number: [id number] Date of Birth/Sex: Treating RN: 21-Dec-1955 (67 y.o. Robert Levine Primary Care Provider: Ilsa Iha Other Clinician: Roxy Levine ([id number]) [id number] Page 3 of 8 Referring Provider: Treating Provider/Extender: Ammie Ferrier in Treatment: 16 Constitutional . Cardiovascular . Psychiatric . Notes Previous right great toe amputation. T the plantar aspect of the previous amputation site there is Epithelization to the previous wound site. No signs of o surrounding infection Including increased warmth, erythema or purulent drainage. Electronic Signature(s) Signed: 10/17/2022 1:39:39 PM By: Geralyn Corwin DO Entered By: Geralyn Corwin on 10/17/2022 09:35:12 -------------------------------------------------------------------------------- Physician Orders Details Patient Name: Date of  Service: Robert Levine, [name] 10/17/2022 8:30 A M Medical Record Number: [id number] Patient Account Number: [id number] Date of Birth/Sex: Treating RN: 1955-07-07 (67 y.o. Robert Levine Primary Care Provider: Ilsa Iha Other Clinician: Referring Provider: Treating Provider/Extender: Ammie Ferrier in Treatment: 16 Verbal / Phone Orders: No Diagnosis Coding ICD-10 Coding Code Description E11.621 Type 2 diabetes mellitus with foot ulcer L97.512 Non-pressure chronic ulcer of other part of right foot with fat layer exposed Z89.411 Acquired absence of right great toe N18.30 Chronic kidney disease, stage 3 unspecified I10 Essential (primary) hypertension Electronic Signature(s) Signed: 10/17/2022 1:39:39 PM By: Geralyn Corwin DO Entered By: Geralyn Corwin on 10/17/2022 09:37:02 Robert Levine ([id number]) [id number] Page 4 of 8 -------------------------------------------------------------------------------- Problem List Details Patient Name: Date of Service: Robert Levine [facility] 10/17/2022 8:30 A M Medical Record Number: [id number] Patient Account Number: [id number] Date of Birth/Sex: Treating RN: [birth year] (67 y.o. Robert Levine Primary Care Provider: Ilsa Iha Other Clinician: Referring Provider: Treating Provider/Extender: Ammie Ferrier in Treatment: 16 Active Problems ICD-10 Encounter Code Description Active Date MDM Diagnosis E11.621 Type 2 diabetes mellitus with foot ulcer 06/27/2022 No Yes L97.512 Non-pressure chronic ulcer of other part of right foot with fat layer exposed 06/27/2022 No Yes Z89.411 Acquired absence of right great toe 06/27/2022 No Yes N18.30 Chronic kidney disease, stage 3 unspecified 06/27/2022 No Yes I10 Essential (primary)  hypertension 06/27/2022 No Yes Inactive Problems Resolved Problems Electronic Signature(s) Signed: 10/17/2022 1:39:39 PM By: Geralyn Corwin DO Entered  By: Geralyn Corwin on 10/17/2022 09:33:45 -------------------------------------------------------------------------------- Progress Note Details Patient Name: Date of Service: Robert Levine, [name] 10/17/2022 8:30 A M Medical Record Number: [id number] Patient Account Number: [id number] Date of Birth/Sex: Treating RN: 01-09-1956 (67 y.o. Robert Levine Primary Care Provider: Ilsa Iha Other Clinician: Referring Provider: Treating Provider/Extender: Ammie Ferrier in Treatment: 16 Subjective Chief Complaint Information obtained from Patient 06/27/2022; Right great toe ulcer at amputation site History of Present Illness (HPI) Robert Levine, Robert Levine ([id number]) [id number] Page 5 of 8 01-15-2022 patient presents today as a referral from Dr. Rosetta Posner who is a local podiatrist due to a foot wound that is not getting better. The patient unfortunately has had this going on for quite some time he is not necessarily the best historian which makes it a little bit difficult but nonetheless I did review notes as well together where things stand what is going on right now has been using Vaseline over the area according to what he tells me he has had debridements with Dr. Excell Seltzer intermittently. With that being said the patient has not really been using his offloading shoe as much she does have diabetic shoes and he tells me that is primarily what has been wearing. 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 He also has high blood pressure. 01-25-2022 upon evaluation today patient's wound actually showing signs of improvement though there is still little bit of callus and some biofilm and slough buildup on the surface of the wound. I Minna perform sharp debridement to clear this away today. He tells me has  been using the shoe a lot of the time though sometimes when he is at the assisted living facility he just walks around with a sock not using the shoe. I explained that he needs to make sure to always have the shoe on when he is up and moving around as can help this to heal most effectively. 02-01-2022 upon evaluation today patient appears to be doing well currently in regard to his wound from the standpoint of this not getting worse it does not look to be infected. With that being said unfortunately he does have an open wound that is going to require some sharp debridement today and again I am concerned that if we do not get this closed he is going to end up with this worsening in general. He has not really been wearing the postop shoe with front offloading unfortunately which means that he is not really getting much better. I think that a total contact cast is probably going to be the method in which to try to get this healed as quickly as possible. Again the sooner we get healed lessen the chance that this will develop into a more significant ulceration requiring any type of surgery. We especially would avoid any chance for amputation. 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 Here for application of TCC #1 11/9; obligatory first total contact cast change. The patient had no particular problems. TCC reapplied 02-15-2022 upon evaluation today patient appears to be doing well currently in regard to his wound. There is some callus however buildup that is going to need to be cleared away. Fortunately there does not appear to be any signs of  infection this is actually his third cast application today although the first 2 were last week and this is already quite a bit smaller. 02-19-2022 upon evaluation today patient's wound actually is showing signs of doing well with the cast he is actually making good progress. Fortunately I do not see any evidence of infection locally or systemically at this  time which is great news overall I think he is on the right track. 02-26-2022 upon evaluation today patient actually appears to be completely healed which is great news. Fortunately I see no evidence of active infection at this time locally or systemically which is great news as well. 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 He has been treated in our clinic for this issue previously with a total contact cast. He has a podiatrist. He resides in a nursing facility. They have recommended he come in for his wound care. He is fairly unaware of the wound and its condition. 4/10; patient presents for follow-up. He has been using Hydrofera Blue to the wound bed along with his front offloading shoe on the right foot. He has taken his oral antibiotics without issues. 4/24; patient presents for follow-up. He has been using Hydrofera Blue to the wound bed. He has stopped using his front offloading shoe because he thinks it is not helping. He has no issues or complaints today. He would like to follow-up every 2 weeks. 5/8; this is a patient with a wound at the amp site of the right great toe. There is a small horizontal linear wound but surrounded with thick buildup of callus. He is intermittently using his forefoot offloading boot but even with this he probably is not walking in this properly not putting the weight back on his heel I went over this with him. If this does not work he probably needs a total contact cast and I think he has had 1 of these in the past. Not sure if there is an issue We have been using Hydrofera Blue as the primary dressing 08-16-2022 this is a gentleman that I have previously seen in the clinic although  the patient tells me that this is not doing too poorly in fact he feels like that it is doing better. He does have home health coming out. With that being said I do not see any signs of active infection locally nor systemically at this time which is great news. He does have a tremendous amount of callus however there is no need to be managed. 6/1; we did not have a total contact cast to apply and I think the wound on the plantar right first metatarsal head was about the same. He is using a forefoot off loader and dressing with Hydrofera Blue. Unfortunately he has significant callus around the wound area 5/29; patient presents for follow-up. He has been using Hydrofera Blue to the wound bed. The wound has declined in appearance. We discussed a total contact cast and patient was agreeable to move forward with this at next clinic visit. 6/12; patient presents for follow-up. He is ready for the total contact cast placement today. Wound is overall stable from last visit. No signs of infection. 09-14-2022 upon evaluation today patient appears to be doing well currently in  regard to his wound after 2 days he is already showing signs of improvement which is great news. Fortunately I do not see any signs of active infection locally nor systemically at this time. 09-20-2022 upon evaluation patient's wound bed actually showed signs of good granulation epithelization this is significantly smaller he still has a lot of callus around the edges of the wound and will perform debridement to clear this away. 6/26; patient presents for follow-up. We have been using Hydrofera Blue under the total contact cast to the right lower foot wound. His wound is smaller. He is tolerated the total contact cast well. 7/3; patient presents for follow-up. We have been using Hydrofera Blue under the total contact cast to the right lower extremity. His right foot wound has healed. 7/17; patient presents for 2-week follow-up to assure  that the wound on the right foot has remained closed. No open wounds today. Objective Constitutional Vitals Time Taken: 8:41 AM, Height: 77 in, Weight: 257 lbs, BMI: 30.5, Temperature: 98.2 F, Pulse: 86 bpm, Respiratory Rate: 18 breaths/min, Blood Pressure: Robert Levine, Robert Levine ([id number]) [id number] Page 6 of 8 126/72 mmHg. General Notes: Previous right great toe amputation. T the plantar aspect of the previous amputation site there is Epithelization to the previous wound site. No o signs of surrounding infection Including increased warmth, erythema or purulent drainage. Integumentary (Hair, Skin) Wound #2 status is Healed - Epithelialized. Original cause of wound was Surgical Injury. The date acquired was: 06/03/2022. The wound has been in treatment 16 weeks. The wound is located on the Right Amputation Site - T The wound measures 0cm length x 0cm width x 0cm depth; 0cm^2 area and 0cm^3 volume. oe. There is a medium amount of serosanguineous drainage noted. There is no granulation within the wound bed. There is no necrotic tissue within the wound bed. 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 He has no issues or complaints today. He has diabetic shoes. He may follow-up as needed. Plan 1. Follow-up as needed 2. Discharge from clinic due to closed wound Electronic Signature(s) Signed: 10/17/2022 1:39:39 PM By: Geralyn Corwin DO Entered By: Geralyn Corwin on 10/17/2022 09:36:11 -------------------------------------------------------------------------------- ROS/PFSH Details Patient Name: Date of Service: Robert Levine, [name] 10/17/2022 8:30 A M Medical Record Number:  [id number] Patient Account Number: [id number] Date of Birth/Sex: Treating RN: [birth year] (67 y.o. Robert Levine Primary Care Provider: Ilsa Iha Other Clinician: Referring Provider: Treating Provider/Extender: Ammie Ferrier in Treatment: 16 Information Obtained From Patient Cardiovascular Medical History: Positive for: Hypertension Endocrine Medical History: Positive for: Type II Diabetes Robert Levine, Robert Levine ([id number]) [id number] Page 7 of 8 Time with diabetes: 5 years Treated with: Insulin Genitourinary Medical History: Negative for: End Stage Renal Disease Past Medical History Notes: CKD3 Integumentary (Skin) Medical History: Negative for: History of Burn; History of pressure wounds Immunizations Pneumococcal Vaccine: Received Pneumococcal Vaccination: Yes Received Pneumococcal Vaccination On or After 60th Birthday: Yes Implantable Devices None Family and Social History Never smoker; Marital Status - Single; Alcohol Use: Never; Drug Use: No History; Caffeine Use: Daily Electronic Signature(s) Signed: 10/17/2022 1:39:39 PM By: Geralyn Corwin DO Signed: 10/18/2022 4:23:38 PM By: Midge Aver MSN RN CNS WTA Entered By: Geralyn Corwin on 10/17/2022 09:37:11 -------------------------------------------------------------------------------- SuperBill Details Patient Name: Date of Service:  Robert Levine, [name] 10/17/2022 Medical Record Number: [id number] Patient Account Number: [id number] Date of Birth/Sex: Treating RN: 11-06-1955 (67 y.o. Robert Levine Primary Care Provider: Ilsa Iha Other Clinician: Referring Provider: Treating Provider/Extender: Ammie Ferrier in Treatment: 16 Diagnosis Coding ICD-10 Codes Code Description E11.621 Type 2 diabetes mellitus with foot ulcer L97.512 Non-pressure chronic ulcer of other part of right foot with fat layer exposed Z89.411 Acquired absence of  right great toe N18.30 Chronic kidney disease, stage 3 unspecified I10 Essential (primary) hypertension Physician Procedures : CPT4 Code Description Modifier [id number] - WC PHYS LEVEL 3 - EST PT ICD-10 Diagnosis Description L97.512 Non-pressure chronic ulcer of other part of right foot with fat layer exposed E11.621 Type 2 diabetes mellitus with foot ulcer Z89.411 Acquired  absence of right great toe Robert Levine, Robert Levine ([id number]) [id number] N18.30 Chronic kidney disease, stage 3 unspecified Quantity: 1 Page 8 of 8 Electronic Signature(s) Signed: 10/17/2022 1:39:39 PM By: Geralyn Corwin DO Entered By: Geralyn Corwin on 10/17/2022 09:36:56

## 2022-10-18 NOTE — Progress Notes (Addendum)
VASILIS, RELLES ([id number]) [id number] Page 1 of 7 Visit Report for 10/17/2022 Arrival Information Details Patient Name: Date of Service: [name] [facility] 10/17/2022 8:30 A M Medical Record Number: [id number] Patient Account Number: [id number] Date of Birth/Sex: Treating RN: 1955-04-16 (67 y.o. [name] Primary Care Jermesha Sottile: Ilsa Iha Other Clinician: Referring Culver Feighner: Treating Keaja Reaume/Extender: Ammie Ferrier in Treatment: 16 Visit Information History Since Last Visit Added or deleted any medications: No Patient Arrived: Ambulatory Any new allergies or adverse reactions: No Arrival Time: 08:38 Has Dressing in Place as Prescribed: Yes Accompanied By: self Pain Present Now: No Transfer Assistance: None Patient Identification Verified: Yes Secondary Verification Process Completed: Yes Patient Requires Transmission-Based Precautions: No Patient Has Alerts: Yes Patient Alerts: DM II Electronic Signature(s) Signed: 10/31/2022 4:56:58 PM By: Midge Aver MSN RN CNS WTA Previous Signature: 10/18/2022 4:23:38 PM Version By: Midge Aver MSN RN CNS WTA Entered By: Midge Aver on 10/31/2022 13:56:57 -------------------------------------------------------------------------------- Clinic Level of Care Assessment Details Patient Name: Date of Service: [name], [name] [facility] 10/17/2022 8:30 A M Medical Record Number: [id number] Patient Account Number: [id number] Date of Birth/Sex: Treating RN: 1956-03-13 (67 y.o. [name] Primary Care Marlowe Lawes: Ilsa Iha Other Clinician: Referring Shailen Thielen: Treating Markeya Mincy/Extender: Ammie Ferrier in Treatment: 16 Clinic Level of Care Assessment Items TOOL 4 Quantity Score X- 1 0 Use when only an EandM is performed on FOLLOW-UP visit ASSESSMENTS - Nursing Assessment / Reassessment []  - 0 Reassessment of Co-morbidities (includes updates in patient status) []  -  0 Reassessment of Adherence to Treatment Plan ASSESSMENTS - Wound and Skin A ssessment / Reassessment X - Simple Wound Assessment / Reassessment - one wound 1 5 [name] ([id number]) [id number] Page 2 of 7 []  - 0 Complex Wound Assessment / Reassessment - multiple wounds []  - 0 Dermatologic / Skin Assessment (not related to wound area) ASSESSMENTS - Focused Assessment []  - 0 Circumferential Edema Measurements - multi extremities []  - 0 Nutritional Assessment / Counseling / Intervention []  - 0 Lower Extremity Assessment (monofilament, tuning fork, pulses) []  - 0 Peripheral Arterial Disease Assessment (using hand held doppler) ASSESSMENTS - Ostomy and/or Continence Assessment and Care []  - 0 Incontinence Assessment and Management []  - 0 Ostomy Care Assessment and Management (repouching, etc.) PROCESS - Coordination of Care X - Simple Patient / Family Education for ongoing care 1 15 []  - 0 Complex (extensive) Patient / Family Education for ongoing care X- 1 10 Staff obtains Chiropractor, Records, T Results / Process Orders est []  - 0 Staff telephones HHA, Nursing Homes / Clarify orders / etc []  - 0 Routine Transfer to another Facility (non-emergent condition) []  - 0 Routine Hospital Admission (non-emergent condition) []  - 0 New Admissions / Manufacturing engineer / Ordering NPWT Apligraf, etc. , []  - 0 Emergency Hospital Admission (emergent condition) X- 1 10 Simple Discharge Coordination []  - 0 Complex (extensive) Discharge Coordination PROCESS - Special Needs []  - 0 Pediatric / Minor Patient Management []  - 0 Isolation Patient Management []  - 0 Hearing / Language / Visual special needs []  - 0 Assessment of Community assistance (transportation, D/C planning, etc.) []  - 0 Additional assistance / Altered mentation []  - 0 Support Surface(s) Assessment (bed, cushion, seat, etc.) INTERVENTIONS - Wound Cleansing / Measurement []  -  0 Simple Wound Cleansing - one wound []  - 0 Complex Wound Cleansing - multiple wounds X- 1 5 Wound Imaging (photographs - any number of wounds) []  - 0 Wound Tracing (instead of photographs) []  -  0 Simple Wound Measurement - one wound []  - 0 Complex Wound Measurement - multiple wounds INTERVENTIONS - Wound Dressings []  - 0 Small Wound Dressing one or multiple wounds []  - 0 Medium Wound Dressing one or multiple wounds []  - 0 Large Wound Dressing one or multiple wounds []  - 0 Application of Medications - topical []  - 0 Application of Medications - injection INTERVENTIONS - Miscellaneous []  - 0 External ear exam []  - 0 Specimen Collection (cultures, biopsies, blood, body fluids, etc.) []  - 0 Specimen(s) / Culture(s) sent or taken to Lab for analysis GAZA, LOREDO ([id number]) [id number] Page 3 of 7 []  - 0 Patient Transfer (multiple staff / Nurse, adult / Similar devices) []  - 0 Simple Staple / Suture removal (25 or less) []  - 0 Complex Staple / Suture removal (26 or more) []  - 0 Hypo / Hyperglycemic Management (close monitor of Blood Glucose) []  - 0 Ankle / Brachial Index (ABI) - do not check if billed separately X- 1 5 Vital Signs Has the patient been seen at the hospital within the last three years: Yes Total Score: 50 Level Of Care: New/Established - Level 2 Notes 2 week confirmation of closure visit Electronic Signature(s) Signed: 11/30/2022 2:44:30 PM By: Midge Aver MSN RN CNS WTA Entered By: Midge Aver on 10/31/2022 13:59:41 -------------------------------------------------------------------------------- Encounter Discharge Information Details Patient Name: Date of Service: [name], [name] 10/17/2022 8:30 A M Medical Record Number: [id number] Patient Account Number: [id number] Date of Birth/Sex: Treating RN: 05-16-1955 (67 y.o. [name] Primary Care Armour Villanueva: Ilsa Iha Other Clinician: Referring Kao Berkheimer: Treating  Shakim Faith/Extender: Ammie Ferrier in Treatment: 16 Encounter Discharge Information Items Discharge Condition: Stable Ambulatory Status: Ambulatory Discharge Destination: Home Transportation: Private Auto Accompanied By: self Schedule Follow-up Appointment: No Clinical Summary of Care: Electronic Signature(s) Signed: 10/31/2022 5:02:07 PM By: Midge Aver MSN RN CNS WTA Entered By: Midge Aver on 10/31/2022 14:02:07 -------------------------------------------------------------------------------- Lower Extremity Assessment Details Patient Name: Date of Service: [name], [name] 10/17/2022 8:30 A Katheren Puller ([id number]) [id number] Page 4 of 7 Medical Record Number: [id number] Patient Account Number: [id number] Date of Birth/Sex: Treating RN: Mar 16, 1956 (67 y.o. [name] Primary Care Ori Trejos: Ilsa Iha Other Clinician: Referring Rimsha Trembley: Treating Kessler Solly/Extender: Ammie Ferrier in Treatment: 16 Edema Assessment Assessed: Kyra Searles: Yes] Franne Forts: Yes] [Left: Edema] Franne Forts: :] Electronic Signature(s) Signed: 10/18/2022 4:23:38 PM By: Midge Aver MSN RN CNS WTA Entered By: Midge Aver on 10/17/2022 05:46:43 -------------------------------------------------------------------------------- Multi-Disciplinary Care Plan Details Patient Name: Date of Service: [name], [name] [facility] 10/17/2022 8:30 A M Medical Record Number: [id number] Patient Account Number: [id number] Date of Birth/Sex: Treating RN: Jan 30, 1956 (67 y.o. [name] Primary Care Jeven Topper: Ilsa Iha Other Clinician: Referring Cheskel Silverio: Treating Morgon Pamer/Extender: Ammie Ferrier in Treatment: 16 Active Inactive Electronic Signature(s) Signed: 10/31/2022 5:00:01 PM By: Midge Aver MSN RN CNS WTA Entered By: Midge Aver on 10/31/2022  14:00:01 -------------------------------------------------------------------------------- Pain Assessment Details Patient Name: Date of Service: [name], [name] [place] 10/17/2022 8:30 A M Medical Record Number: [id number] Patient Account Number: [id number] Date of Birth/Sex: Treating RN: 03-05-1956 (67 y.o. [name] Primary Care Cristal Qadir: Ilsa Iha Other Clinician: Referring Sean Macwilliams: Treating Marveen Donlon/Extender: Ammie Ferrier in Treatment: 16 Active Problems Location of Pain Severity and Description of Pain Patient Has Paino No Site Locations [place] ([id number]) [id number] Page 5 of 7 Pain Management and Medication Current Pain Management: Electronic Signature(s) Signed: 10/31/2022 4:57:08 PM By: Midge Aver MSN RN  CNS WTA Previous Signature: 10/18/2022 4:23:38 PM Version By: Midge Aver MSN RN CNS WTA Entered By: Midge Aver on 10/31/2022 13:57:07 -------------------------------------------------------------------------------- Patient/Caregiver Education Details Patient Name: Date of Service: [name], [name] 7/17/2024andnbsp8:30 A M Medical Record Number: [id number] Patient Account Number: [id number] Date of Birth/Gender: Treating RN: 11-Feb-1956 (67 y.o. [name] Primary Care Physician: Ilsa Iha Other Clinician: Referring Physician: Treating Physician/Extender: Ammie Ferrier in Treatment: 16 Education Assessment Education Provided To: Patient Education Topics Provided Discharge Packet: Handouts: Congratulations Letter Methods: Printed Responses: State content correctly [employer]) Signed: 11/30/2022 2:44:30 PM By: Midge Aver MSN RN CNS WTA Entered By: Midge Aver on 10/31/2022 14:00:15 [name] ([id number]) [id number] Page 6 of 7 -------------------------------------------------------------------------------- Wound  Assessment Details Patient Name: Date of Service: [name] [facility] 10/17/2022 8:30 A M Medical Record Number: [id number] Patient Account Number: [id number] Date of Birth/Sex: Treating RN: Nov 28, 1955 (67 y.o. 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 Wound Status: Healed - Epithelialized Wounding Event: Surgical Injury Comorbid History: Hypertension, Type II Diabetes Date Acquired: 06/03/2022 Weeks Of Treatment: 16 Clustered Wound: No Photos Wound Measurements Length: (cm) Width: (cm) Depth: (cm) Area: (cm) Volume: (cm) 0 % Reduction in Area: 100% 0 % Reduction in Volume: 100% 0 Epithelialization: Large (67-100%) 0 0 Wound Description Classification: Grade 2 Exudate Amount: Medium Exudate Type: Serosanguineous Exudate Color: red, brown Foul Odor After Cleansing: No Slough/Fibrino Yes Wound Bed Granulation Amount: None Present (0%) Exposed Structure Necrotic Amount: None Present (0%) Fascia Exposed: No Fat Layer (Subcutaneous Tissue) Exposed: No Tendon Exposed: No Muscle Exposed: No Joint Exposed: No Bone Exposed: No Electronic Signature(s) Signed: 10/18/2022 4:23:38 PM By: Midge Aver MSN RN CNS WTA Entered By: Midge Aver on 10/17/2022 05:48:53 [name] ([id number]) [id number] Page 7 of 7 -------------------------------------------------------------------------------- Vitals Details Patient Name: Date of Service: [name] [facility] 10/17/2022 8:30 A M Medical Record Number: [id number] Patient Account Number: [id number] Date of Birth/Sex: Treating RN: July 27, 1955 (67 y.o. [name] Primary Care Dontre Laduca: Ilsa Iha Other Clinician: Referring Jenan Ellegood: Treating  Caroll Cunnington/Extender: Ammie Ferrier in Treatment: 16 Vital Signs Time Taken: 08:41 Temperature (F): 98.2 Height (in): 77 Pulse (bpm): 86 Weight (lbs): 257 Respiratory Rate (breaths/min): 18 Body Mass Index (BMI): 30.5 Blood Pressure (mmHg): 126/72 Reference Range: 80 - 120 mg / dl Electronic Signature(s) Signed: 10/31/2022 4:57:02 PM By: Midge Aver MSN RN CNS WTA Previous Signature: 10/18/2022 4:23:38 PM Version By: Midge Aver MSN RN CNS WTA Entered By: Midge Aver on 10/31/2022 13:57:02

## 2022-11-06 NOTE — Progress Notes (Signed)
KOLBEY, ARCURI ([id number]) [id number] Page 1 of 8 Visit Report for 10/03/2022 Arrival Information Details Patient Name: Date of Service: Robert [facility] 10/03/2022 10:15 A M Medical Record Number: [id number] Patient Account Number: [id number] Date of Birth/Sex: Treating RN: [birth year] (66 y.o. Robert Levine) Robert [facility]ine Primary Care : Robert Levine Other Clinician: Referring : Treating /Extender: Robert [facility]ine in Treatment: 14 Visit Information History Since Last Visit Added or deleted any medications: No Patient Arrived: Ambulatory Any new allergies or adverse reactions: No Arrival Time: 10:09 Had a fall or experienced change in No Accompanied By: self activities of daily living that may affect Transfer Assistance: None risk of falls: Patient Identification Verified: Yes Signs or symptoms of abuse/neglect since No Secondary Verification Process Completed: Yes last visito Patient Requires Transmission-Based Precautions: No Hospitalized since last visit: No Patient Has Alerts: Yes Implantable device outside of the clinic No Patient Alerts: DM II excluding cellular tissue based products placed in the center since last visit: Has Dressing in Place as Prescribed: Yes Has Compression in Place as Prescribed: Yes Has Footwear/Offloading in Place as Yes Prescribed: Right: Surgical Shoe with Pressure Relief Insole Pain Present Now: No Electronic Signature(s) Signed: 11/06/2022 3:49:02 PM By: Robert Pax RN Entered By: Robert [facility]ine on 10/03/2022 10:09:39 -------------------------------------------------------------------------------- Clinic Level of Care Assessment Details Patient Name: Date of Service: Robert Levine [facility] 10/03/2022 10:15 A M Medical Record Number: [id number] Patient Account Number: [id number] Date of Birth/Sex: Treating RN: Robert Levine 09, 1957 (66 y.o. Robert Levine) Robert Levine Primary Care : Robert Levine Other  Clinician: Referring : Treating /Extender: Robert Levine in Treatment: 14 Clinic Level of Care Assessment Items TOOL 4 Quantity Score KAED, FEINBERG ([id number]) [id number] Page 2 of 8 X- 1 0 Use when only an EandM is performed on FOLLOW-UP visit ASSESSMENTS - Nursing Assessment / Reassessment X- 1 10 Reassessment of Co-morbidities (includes updates in patient status) X- 1 5 Reassessment of Adherence to Treatment Plan ASSESSMENTS - Wound and Skin A ssessment / Reassessment X - Simple Wound Assessment / Reassessment - one wound 1 5 []  - 0 Complex Wound Assessment / Reassessment - multiple wounds []  - 0 Dermatologic / Skin Assessment (not related to wound area) ASSESSMENTS - Focused Assessment []  - 0 Circumferential Edema Measurements - multi extremities []  - 0 Nutritional Assessment / Counseling / Intervention []  - 0 Lower Extremity Assessment (monofilament, tuning fork, pulses) []  - 0 Peripheral Arterial Disease Assessment (using hand held doppler) ASSESSMENTS - Ostomy and/or Continence Assessment and Care []  - 0 Incontinence Assessment and Management []  - 0 Ostomy Care Assessment and Management (repouching, etc.) PROCESS - Coordination of Care X - Simple Patient / Family Education for ongoing care 1 15 []  - 0 Complex (extensive) Patient / Family Education for ongoing care []  - 0 Staff obtains Chiropractor, Records, T Results / Process Orders est []  - 0 Staff telephones HHA, Nursing Homes / Clarify orders / etc []  - 0 Routine Transfer to another Facility (non-emergent condition) []  - 0 Routine [facility] Admission (non-emergent condition) []  - 0 New Admissions / Manufacturing engineer / Ordering NPWT Apligraf, etc. , []  - 0 Emergency [facility] Admission (emergent condition) X- 1 10 Simple Discharge Coordination []  - 0 Complex (extensive) Discharge Coordination PROCESS - Special Needs []  -  0 Pediatric / Minor Patient Management []  - 0 Isolation Patient Management []  - 0 Hearing / Language / Visual special needs []  - 0 Assessment of Community assistance (transportation, D/C planning, etc.) []  - 0 Additional assistance /  Altered mentation []  - 0 Support Surface(s) Assessment (bed, cushion, seat, etc.) INTERVENTIONS - Wound Cleansing / Measurement []  - 0 Simple Wound Cleansing - one wound []  - 0 Complex Wound Cleansing - multiple wounds []  - 0 Wound Imaging (photographs - any number of wounds) []  - 0 Wound Tracing (instead of photographs) []  - 0 Simple Wound Measurement - one wound []  - 0 Complex Wound Measurement - multiple wounds INTERVENTIONS - Wound Dressings []  - 0 Small Wound Dressing one or multiple wounds []  - 0 Medium Wound Dressing one or multiple wounds []  - 0 Large Wound Dressing one or multiple wounds Robert [facility] ([id number]) [phone].pdf Page 3 of 8 []  - 0 Application of Medications - topical []  - 0 Application of Medications - injection INTERVENTIONS - Miscellaneous []  - 0 External ear exam []  - 0 Specimen Collection (cultures, biopsies, blood, body fluids, etc.) []  - 0 Specimen(s) / Culture(s) sent or taken to Lab for analysis []  - 0 Patient Transfer (multiple staff / Nurse, adult / Similar devices) []  - 0 Simple Staple / Suture removal (25 or less) []  - 0 Complex Staple / Suture removal (26 or more) []  - 0 Hypo / Hyperglycemic Management (close monitor of Blood Glucose) []  - 0 Ankle / Brachial Index (ABI) - do not check if billed separately X- 1 5 Vital Signs Has the patient been seen at the [facility] within the last three years: Yes Total Score: 50 Level Of Care: New/Established - Level 2 Electronic Signature(s) Signed: 11/06/2022 3:49:02 PM By: Robert Pax RN Entered By: Robert Levine on 10/03/2022 10:32:28 -------------------------------------------------------------------------------- Encounter  Discharge Information Details Patient Name: Date of Service: Robert Levine, Robert [facility]ine [facility] 10/03/2022 10:15 A M Medical Record Number: [id number] Patient Account Number: [id number] Date of Birth/Sex: Treating RN: 01-Sep-1955 (66 y.o. Robert Levine Primary Care : Robert [facility] Other Clinician: Referring : Treating /Extender: Robert Levine in Treatment: 14 Encounter Discharge Information Items Discharge Condition: Stable Ambulatory Status: Ambulatory Discharge Destination: Home Transportation: Private Auto Accompanied By: self Schedule Follow-up Appointment: Yes Clinical Summary of Care: Electronic Signature(s) Signed: 10/03/2022 10:35:27 AM By: Robert Pax RN Entered By: Robert Levine on 10/03/2022 10:35:27 Robert [facility] ([id number]) [id number] Page 4 of 8 -------------------------------------------------------------------------------- Lower Extremity Assessment Details Patient Name: Date of Service: Robert [facility] 10/03/2022 10:15 A M Medical Record Number: [id number] Patient Account Number: [id number] Date of Birth/Sex: Treating RN: 24-Dec-1955 (66 y.o. Robert Levine) Robert [facility]ine Primary Care : Robert [facility] Other Clinician: Referring : Treating /Extender: Robert Levine in Treatment: 14 Vascular Assessment Pulses: Dorsalis Pedis Palpable: [Right:Yes] Electronic Signature(s) Signed: 11/06/2022 3:49:02 PM By: Robert Pax RN Entered By: Robert [facility]ine on 10/03/2022 10:21:53 -------------------------------------------------------------------------------- Multi Wound Chart Details Patient Name: Date of Service: Robert Levine, Robert Levine [facility] 10/03/2022 10:15 A M Medical Record Number: [id number] Patient Account Number: [id number] Date of Birth/Sex: Treating RN: 26-Sep-1955 (66 y.o. Robert Levine) Robert Levine Primary Care : Robert Levine Other Clinician: Referring : Treating /Extender:  Robert Levine in Treatment: 14 Vital Signs Height(in): 77 Pulse(bpm): 71 Weight(lbs): 257 Blood Pressure(mmHg): 151/72 Body Mass Index(BMI): 30.5 Temperature(F): 98.1 Respiratory Rate(breaths/min): 18 [2:Photos: No Photos Right Amputation Site - Toe Wound Location: Surgical Injury Wounding Event: Diabetic Wound/Ulcer of the Lower Primary Etiology: Extremity Hypertension, Type II Diabetes Comorbid History: 06/03/2022 Date Acquired: 14 Weeks of Treatment: Open Wound  Status: No Wound Recurrence: 0x0x0 Measurements L x W x D (cm) 0 A (cm) : rea] [N/A:N/A N/A N/A N/A N/A N/A N/A  N/A N/A N/A N/A] ARDON, DUGGAL ([id number]) [2:0 Volume (cm) : 100.00% % Reduction in A rea: 100.00% % Reduction in Volume: Grade 2 Classification: Medium Exudate A mount: Serosanguineous Exudate Type: red, brown Exudate Color: None Present (0%) Granulation A mount: None Present (0%) Necrotic A  mount: Fascia: No Exposed Structures: Fat Layer (Subcutaneous Tissue): No Tendon: No Muscle: No Joint: No Bone: No Large (67-100%) Epithelialization:] [N/A:N/A N/A N/A N/A N/A N/A N/A N/A N/A N/A N/A] Treatment Notes Electronic Signature(s) Signed: 11/06/2022 3:49:02 PM By: Robert Pax RN Entered By: Robert [facility]ine on 10/03/2022 10:22:23 -------------------------------------------------------------------------------- Multi-Disciplinary Care Plan Details Patient Name: Date of Service: Robert Levine, Robert Levine [facility] 10/03/2022 10:15 A M Medical Record Number: [id number] Patient Account Number: [id number] Date of Birth/Sex: Treating RN: Jan 07, 1956 (66 y.o. Robert Levine Primary Care : Robert Levine Other Clinician: Referring : Treating /Extender: Robert Levine in Treatment: 14 Active Inactive Electronic Signature(s) Signed: 10/03/2022 10:33:00 AM By: Robert Pax RN Entered By: Robert Levine on 10/03/2022  10:33:00 -------------------------------------------------------------------------------- Pain Assessment Details Patient Name: Date of Service: Robert [facility] 10/03/2022 10:15 A M Medical Record Number: [id number] Patient Account Number: [id number] Date of Birth/Sex: Treating RN: 17-Dec-1955 (66 y.o. Robert Levine Primary Care : Robert Levine Other Clinician: Referring : Treating /Extender: Robert Levine in Treatment: [address] ([id number]) [id number] Page 6 of 8 Active Problems Location of Pain Severity and Description of Pain Patient Has Paino No Site Locations Pain Management and Medication Current Pain Management: Electronic Signature(s) Signed: 11/06/2022 3:49:02 PM By: Robert Pax RN Entered By: Robert [facility]ine on 10/03/2022 10:10:22 -------------------------------------------------------------------------------- Patient/Caregiver Education Details Patient Name: Date of Service: Robert Levine, Robert Levine Robert [facility]ine 7/3/2024andnbsp10:15 A M Medical Record Number: [id number] Patient Account Number: [id number] Date of Birth/Gender: Treating RN: [birth year] (66 y.o. Robert Levine Primary Care Physician: Robert Levine Other Clinician: Referring Physician: Treating Physician/Extender: Robert Levine in Treatment: 14 Education Assessment Education Provided To: Patient Education Topics Provided Offloading: Handouts: How Offloading Helps Foot Wounds Heal, Other: Discharge instructions Methods: Explain/Verbal Responses: State content correctly Electronic Signature(s) Signed: 11/06/2022 3:49:02 PM By: Robert Pax RN Entered By: Robert Levine on 10/03/2022 10:33:30 Robert Levine ([id number]) [id number] Page 7 of 8 -------------------------------------------------------------------------------- Wound Assessment Details Patient Name: Date of Service: Robert Levine  [facility] 10/03/2022 10:15 A M Medical Record Number: [id number] Patient Account Number: [id number] Date of Birth/Sex: Treating RN: Dec 20, 1955 (66 y.o. 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 Wound Status: Open Wounding Event: Surgical Injury Comorbid History: Hypertension, Type II Diabetes Date Acquired: 06/03/2022 Weeks Of Treatment: 14 Clustered Wound: No Wound Measurements Length: (cm) Width: (cm) Depth: (cm) Area: (cm) Volume: (cm) 0 % Reduction in Area: 100% 0 % Reduction in Volume: 100% 0 Epithelialization: Large (67-100%) 0 Tunneling: No 0 Undermining: No Wound Description Classification: Grade 2 Exudate Amount: Medium Exudate Type: Serosanguineous Exudate Color: red, brown Foul Odor After Cleansing: No Slough/Fibrino Yes Wound Bed Granulation Amount: None Present (0%) Exposed Structure Necrotic Amount: None Present (0%) Fascia Exposed: No Fat Layer (Subcutaneous Tissue) Exposed: No Tendon Exposed: No Muscle Exposed: No Joint Exposed: No Bone Exposed: No Electronic Signature(s) Signed: 11/06/2022 3:49:02 PM By: Robert Pax RN Entered By: Robert Levine on 10/03/2022 10:21:37 -------------------------------------------------------------------------------- Vitals Details Patient Name: Date of Service: Robert Levine,  Robert Levine Robert [facility] 10/03/2022 10:15 A M Medical Record Number: [id number] Patient Account Number: [id number] Date of Birth/Sex: Treating RN: 11-Sep-1955 (66 y.o. Robert Levine) Robert Levine [place] ([id number]) [id number] Page 8 of 8 Primary Care : Robert Levine Other Clinician: Referring : Treating /Extender: Robert Levine in Treatment:  14 Vital Signs Time Taken: 10:09 Temperature (F): 98.1 Height (in): 77 Pulse (bpm): 71 Weight (lbs): 257 Respiratory Rate (breaths/min): 18 Body Mass Index (BMI): 30.5 Blood Pressure (mmHg): 151/72 Reference Range: 80 - 120 mg / dl Electronic Signature(s) Signed: 11/06/2022 3:49:02 PM By: Robert Pax RN Entered By: Robert Levine on 10/03/2022 10:10:15

## 2024-02-21 IMAGING — US US CAROTID DUPLEX BILAT
1 series · 14 of 24 positions shown · non-contrast
Comparison: None.

CLINICAL DATA: Syncope

Hypertension
Hyperlipidemia
Diabetes
EXAM:
BILATERAL CAROTID DUPLEX ULTRASOUND
TECHNIQUE: Gray scale imaging, color Doppler and duplex ultrasound were
performed of bilateral carotid and vertebral arteries in the neck.

[Series 1: us carotid bilateral · 14 of 62 slices shown]
[im 1/62]
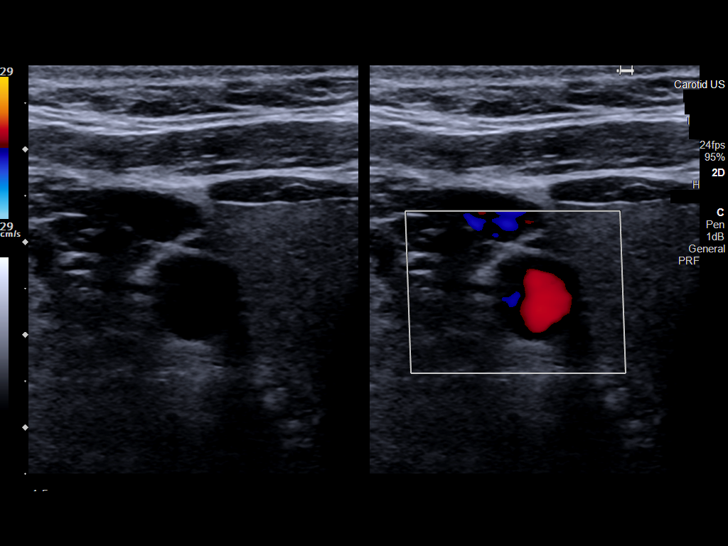
[im 6/62]
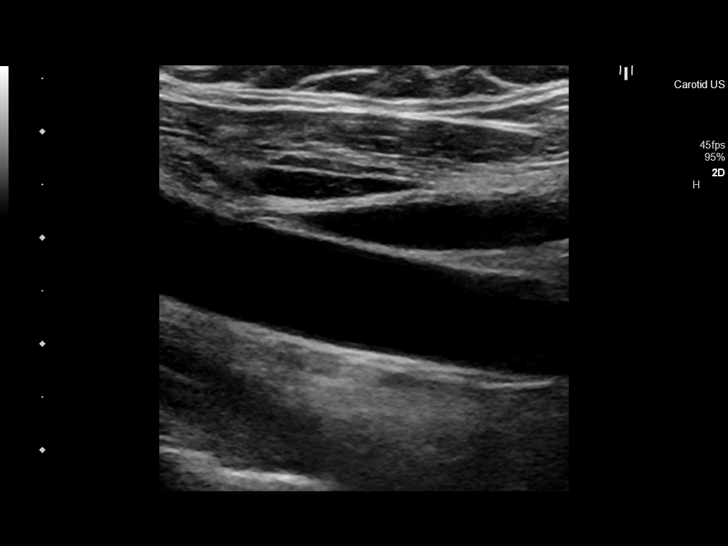
[im 11/62]
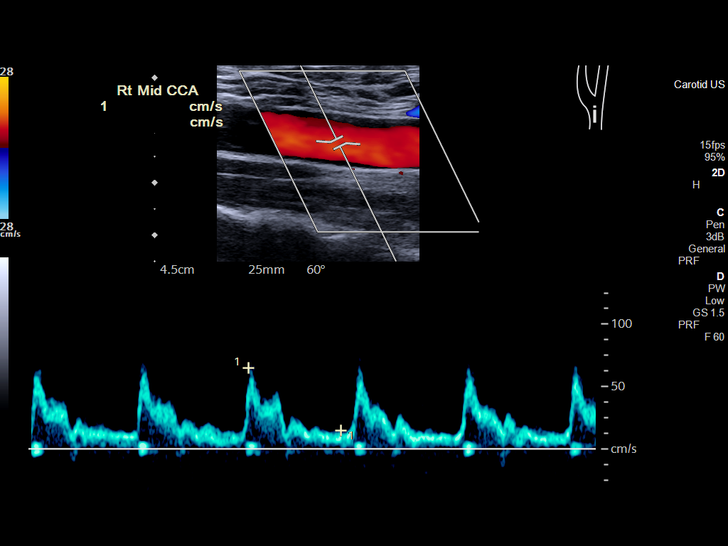
[im 16/62]
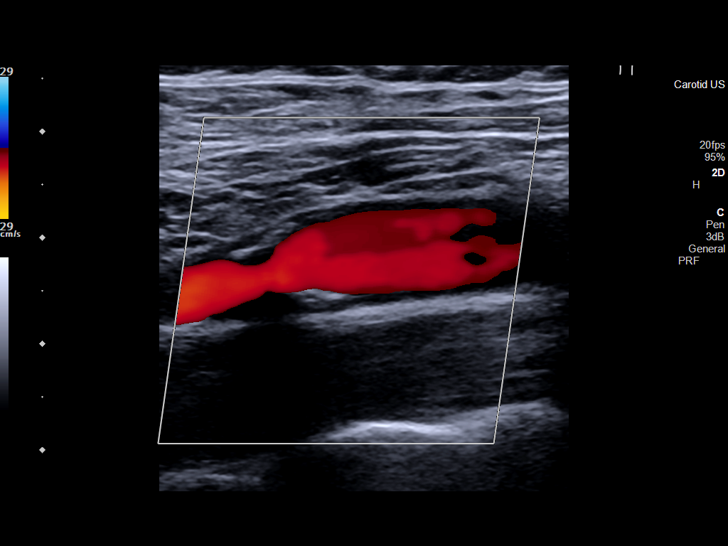
[im 19/62]
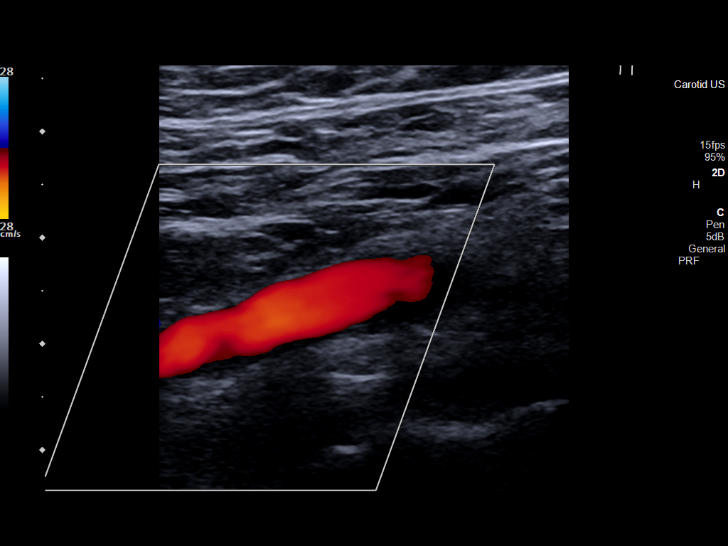
[im 24/62]
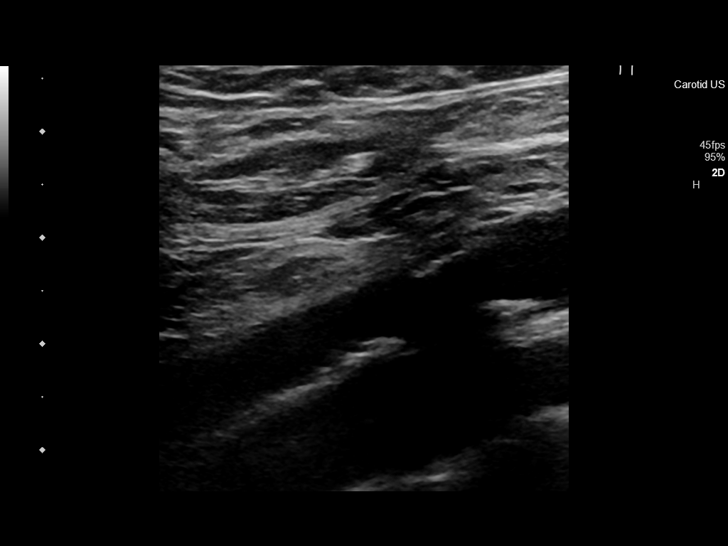
[im 30/62]
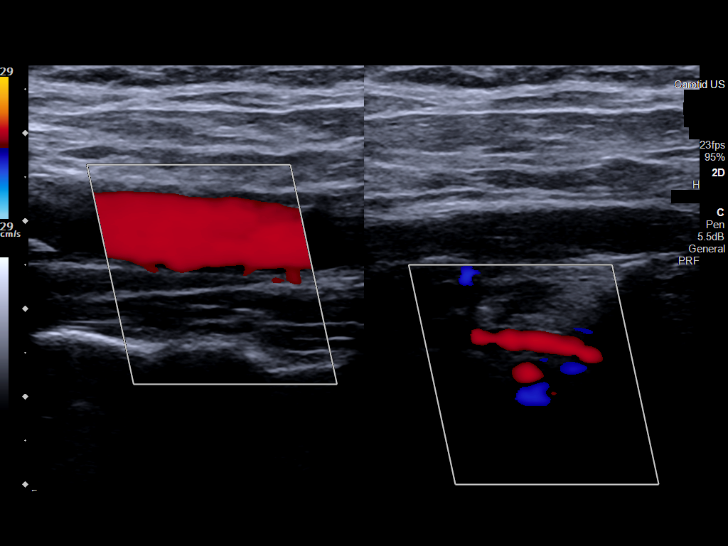
[im 32/62]
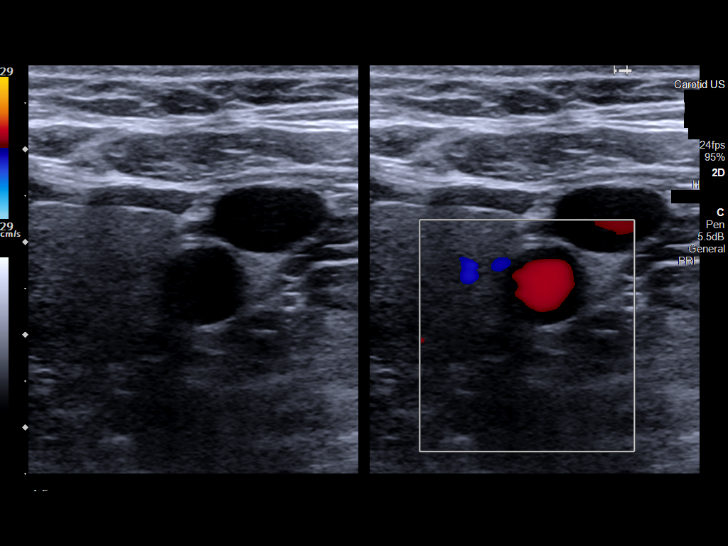
[im 38/62]
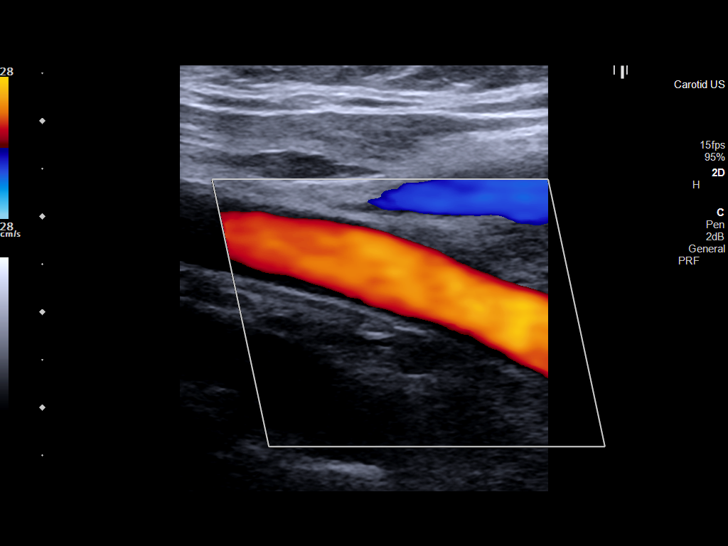
[im 43/62]
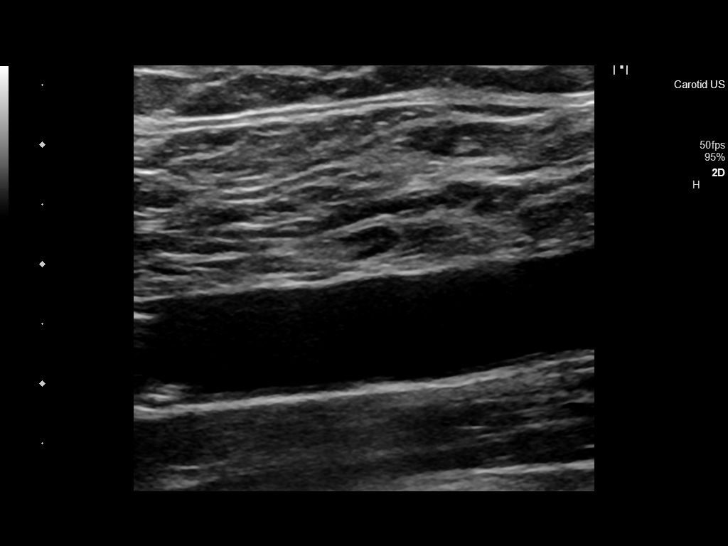
[im 48/62]
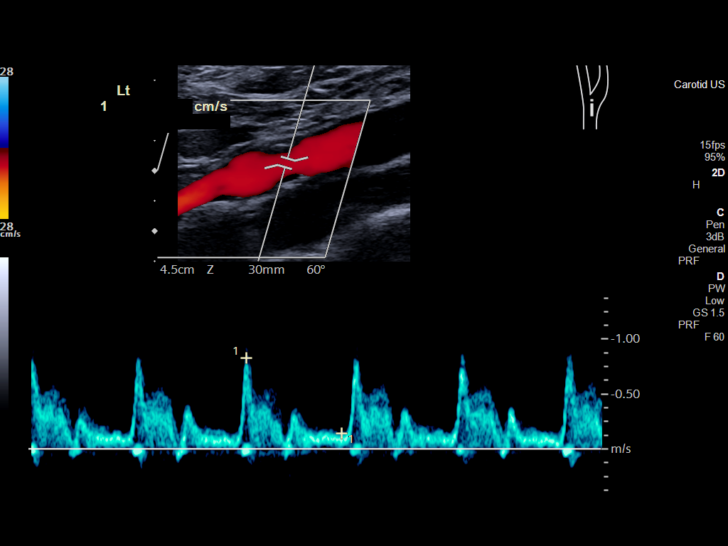
[im 51/62]
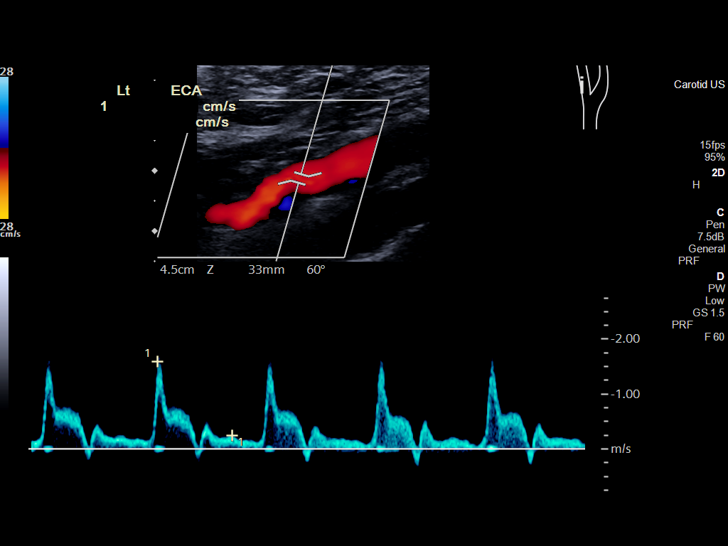
[im 56/62]
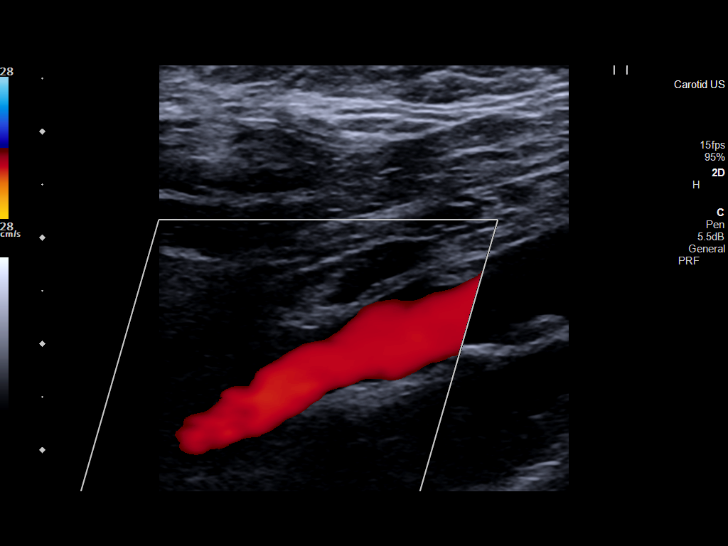
[im 62/62]
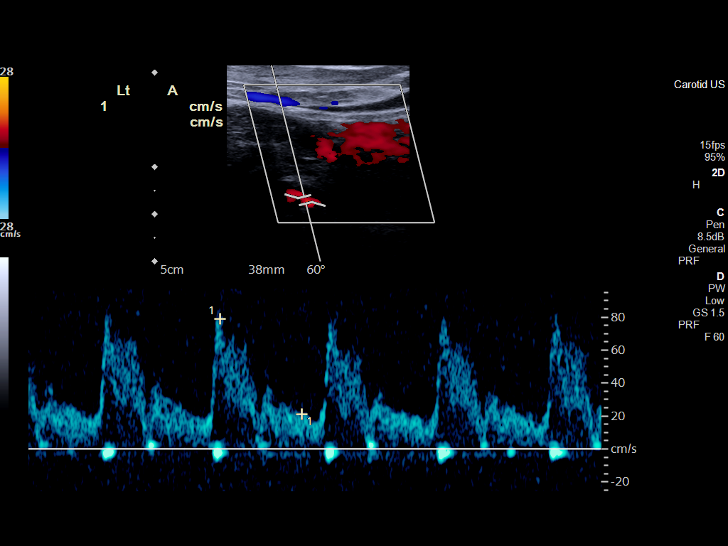

[14 of 24 positions shown; findings below may reference images not displayed]

FINDINGS: Criteria: Quantification of carotid stenosis is based on velocity
parameters that correlate the residual internal carotid diameter
with NASCET-based stenosis levels, using the diameter of the distal
internal carotid lumen as the denominator for stenosis measurement.

The following velocity measurements were obtained:

RIGHT

ICA: 102/31 cm/sec

CCA: 65/15 cm/sec

SYSTOLIC ICA/CCA RATIO:

ECA: 182 cm/sec

LEFT

ICA: 91/29 cm/sec

CCA: 95/13 cm/sec

SYSTOLIC ICA/CCA RATIO:

ECA: 159 cm/sec

RIGHT CAROTID ARTERY: Mild calcified plaque of the carotid
bifurcation.

RIGHT VERTEBRAL ARTERY:  Antegrade flow.

LEFT CAROTID ARTERY: Mild heterogeneous plaque of the left carotid
bifurcation

LEFT VERTEBRAL ARTERY:  Antegrade flow.
IMPRESSION: No significant stenosis of internal carotid arteries.

## 2024-02-21 IMAGING — CT CT HEAD W/O CM
4 series · 16 of 47 positions shown, 18 images · non-contrast
Comparison: None.

CLINICAL DATA: 65-year-old male with altered mental status, loss of
consciousness while eating breakfast.



[Series 2: head bone · axial · 0.47mm/px · z∈[+212,+244]mm · 3 of 81 slices shown]
[im 9/81  bone]
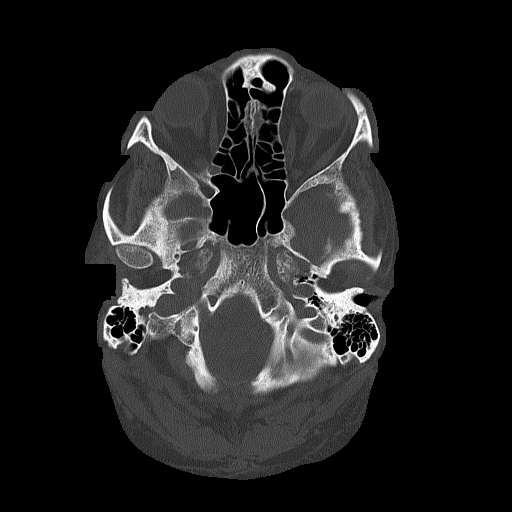
[im 17/81  bone]
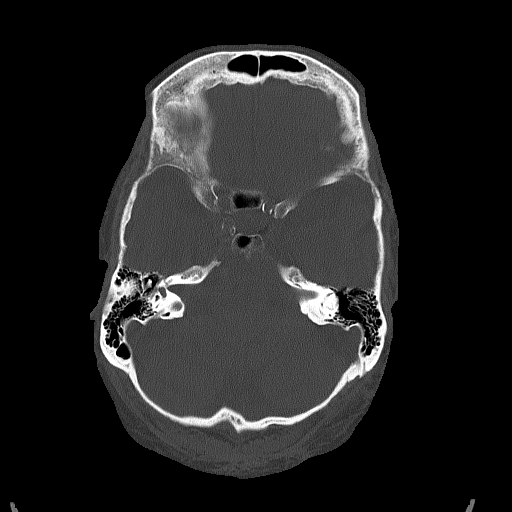
[im 25/81  bone]
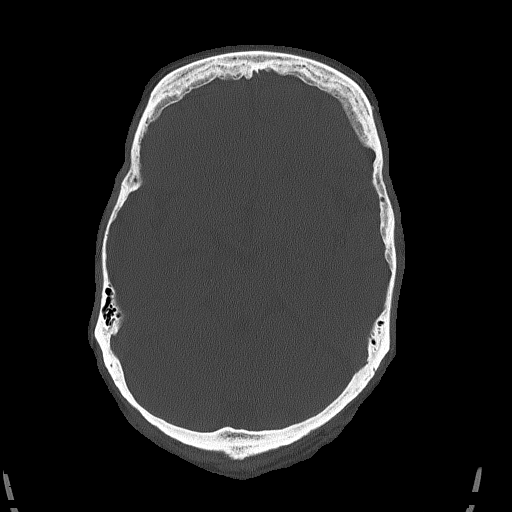

[Series 3: head wo · axial · 0.47mm/px · z∈[+216,+336]mm · 7 of 33 slices shown, 9 images]
[im 5/33  brain]
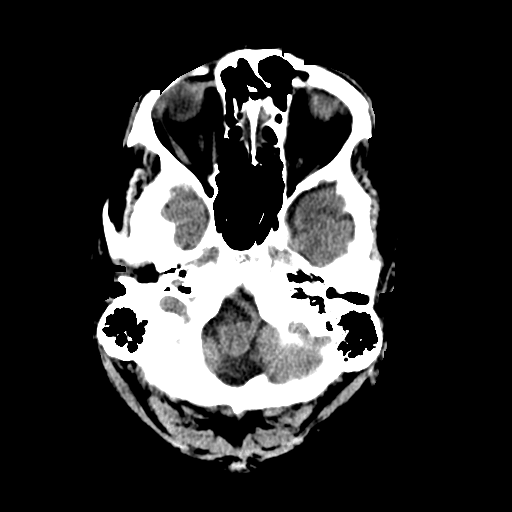
[im 5/33  bone]
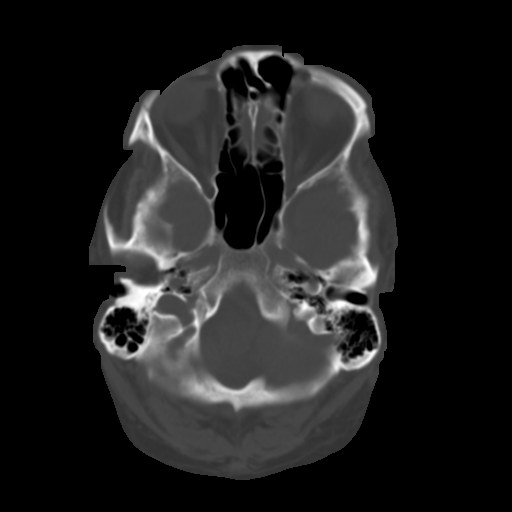
[im 9/33  brain]
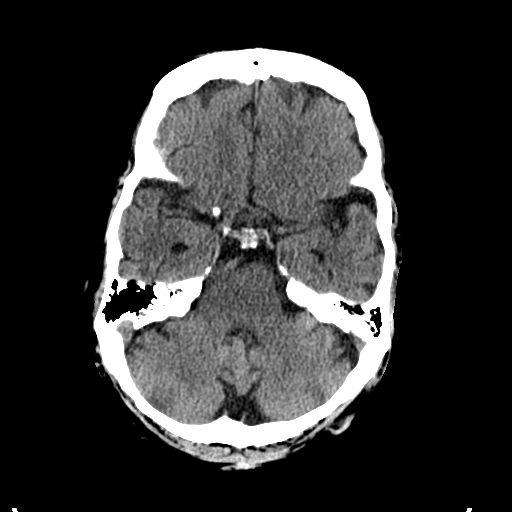
[im 13/33  brain]
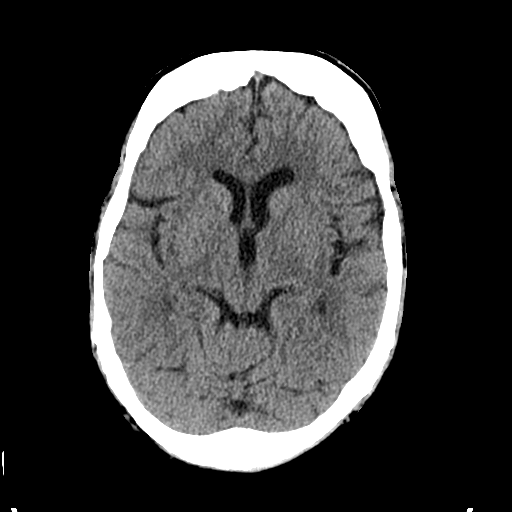
[im 17/33  brain]
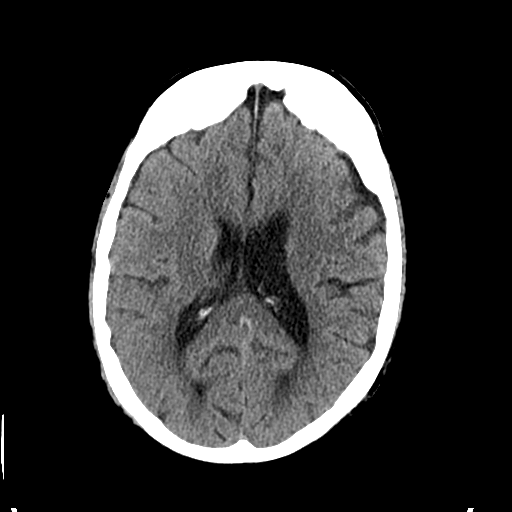
[im 21/33  brain]
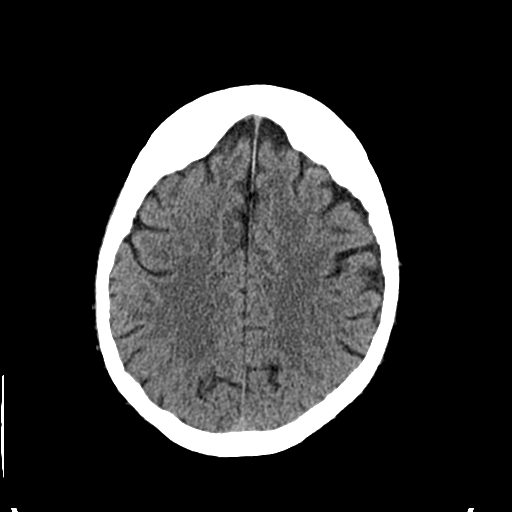
[im 21/33  bone]
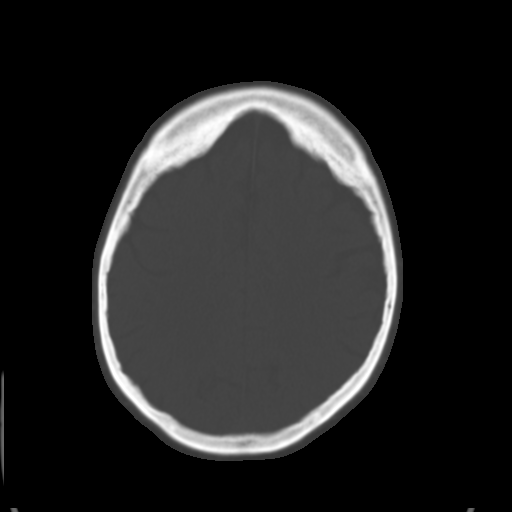
[im 25/33  brain]
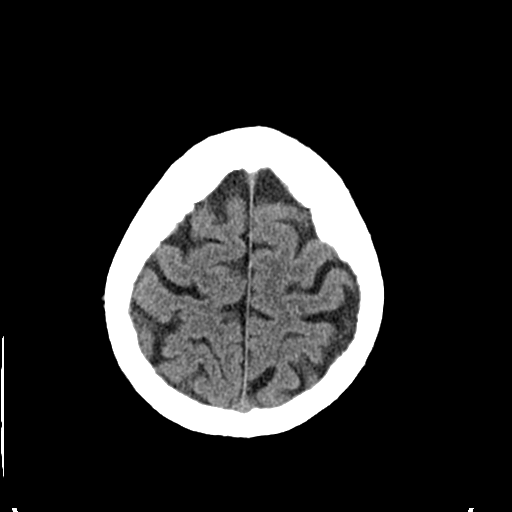
[im 29/33  brain]
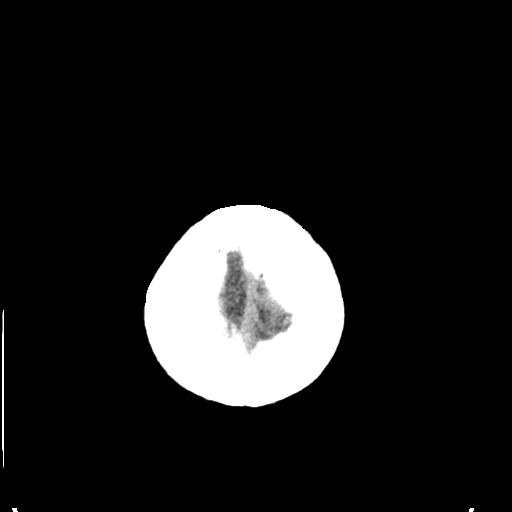

[Series 4: coronal soft tissue · coronal · 0.38mm/px · 3 of 73 slices shown]
[im 25/73  brain]
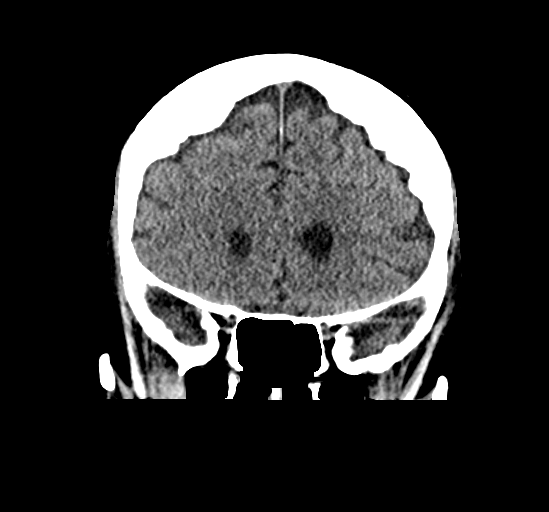
[im 33/73  brain]
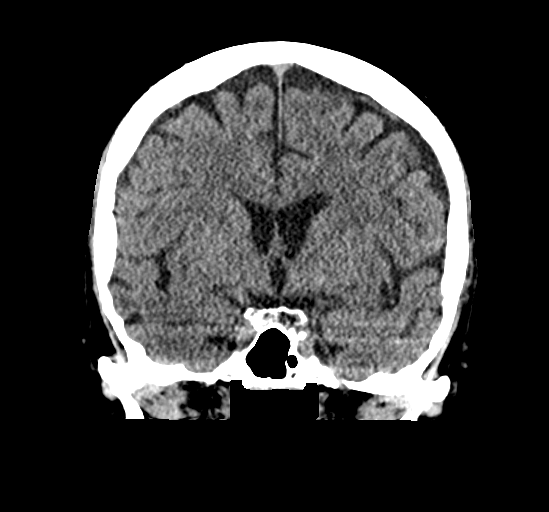
[im 41/73  brain]
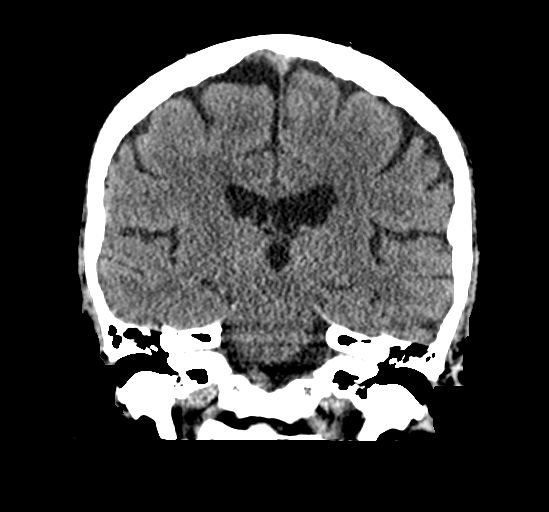

[Series 5: sagittal soft tissue · sagittal · 0.38mm/px · 3 of 56 slices shown]
[im 19/56  brain]
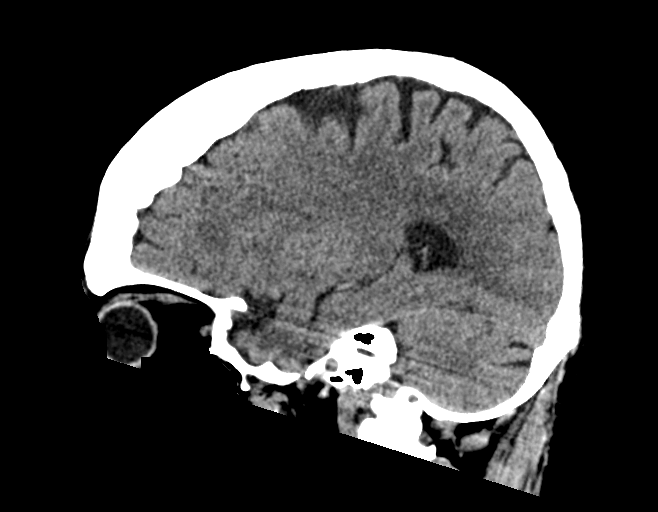
[im 28/56  brain]
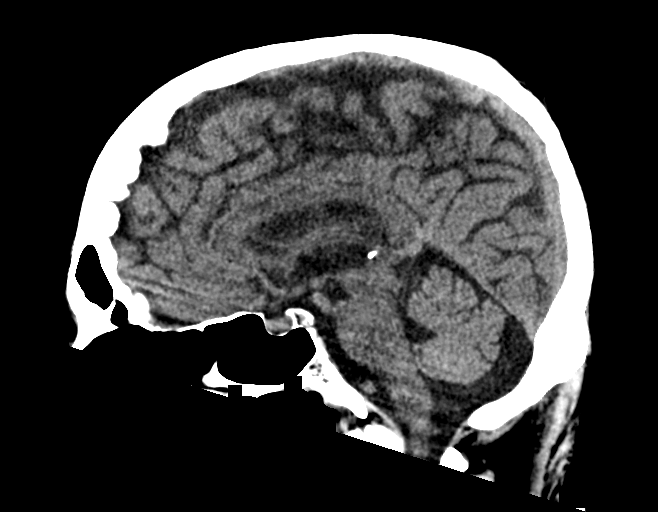
[im 37/56  brain]
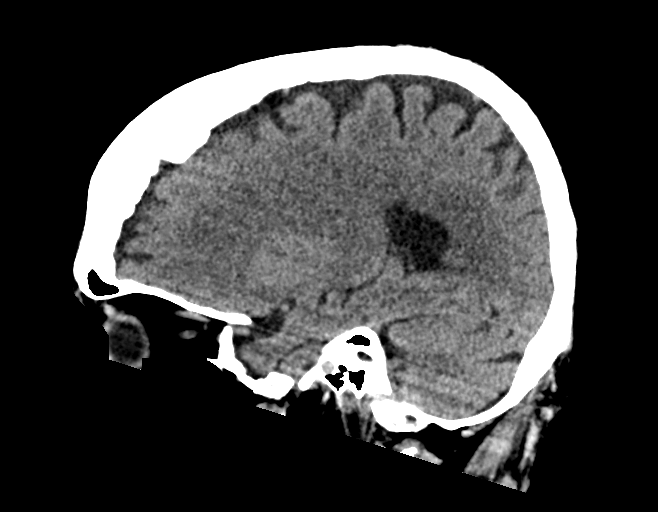

[16 of 47 positions shown; findings below may reference images not displayed]

FINDINGS: Brain: Cerebral volume is within normal limits for age. No midline
shift, ventriculomegaly, mass effect, evidence of mass lesion,
intracranial hemorrhage or evidence of cortically based acute
infarction. Possible tiny chronic lacunar infarct in the right
cerebellum (coronal image 59). But otherwise gray-white matter
differentiation throughout the brain appears normal for age. No
cerebral cortical encephalomalacia identified.

Vascular: Calcified atherosclerosis at the skull base. No suspicious
intracranial vascular hyperdensity.

Skull: No acute osseous abnormality identified. Hyperostosis
frontalis, normal variant.

Sinuses/Orbits: Visualized paranasal sinuses and mastoids are clear.
Tympanic cavities are clear.

Other: Visualized orbits and scalp soft tissues are within normal
limits.
IMPRESSION: No acute intracranial abnormality. Negative brain for age aside from
possible tiny chronic lacunar infarct in the right cerebellum.

## 2024-02-25 ENCOUNTER — Encounter: Attending: Physician Assistant | Admitting: Physician Assistant

## 2024-02-25 DIAGNOSIS — L97512 Non-pressure chronic ulcer of other part of right foot with fat layer exposed: Secondary | ICD-10-CM | POA: Insufficient documentation

## 2024-02-25 DIAGNOSIS — E11621 Type 2 diabetes mellitus with foot ulcer: Secondary | ICD-10-CM | POA: Insufficient documentation

## 2024-02-25 DIAGNOSIS — Z89411 Acquired absence of right great toe: Secondary | ICD-10-CM | POA: Insufficient documentation

## 2024-02-25 DIAGNOSIS — I129 Hypertensive chronic kidney disease with stage 1 through stage 4 chronic kidney disease, or unspecified chronic kidney disease: Secondary | ICD-10-CM | POA: Insufficient documentation

## 2024-02-25 DIAGNOSIS — N183 Chronic kidney disease, stage 3 unspecified: Secondary | ICD-10-CM | POA: Insufficient documentation

## 2024-03-10 ENCOUNTER — Encounter: Admitting: Internal Medicine

## 2024-03-17 ENCOUNTER — Encounter: Attending: Internal Medicine | Admitting: Internal Medicine

## 2024-03-17 DIAGNOSIS — L97512 Non-pressure chronic ulcer of other part of right foot with fat layer exposed: Secondary | ICD-10-CM | POA: Insufficient documentation

## 2024-03-17 DIAGNOSIS — I129 Hypertensive chronic kidney disease with stage 1 through stage 4 chronic kidney disease, or unspecified chronic kidney disease: Secondary | ICD-10-CM | POA: Insufficient documentation

## 2024-03-17 DIAGNOSIS — E11621 Type 2 diabetes mellitus with foot ulcer: Secondary | ICD-10-CM | POA: Diagnosis present

## 2024-03-17 DIAGNOSIS — N183 Chronic kidney disease, stage 3 unspecified: Secondary | ICD-10-CM | POA: Diagnosis not present

## 2024-03-17 DIAGNOSIS — E1122 Type 2 diabetes mellitus with diabetic chronic kidney disease: Secondary | ICD-10-CM | POA: Insufficient documentation

## 2024-03-30 ENCOUNTER — Encounter: Admitting: Physician Assistant

## 2024-03-30 DIAGNOSIS — E11621 Type 2 diabetes mellitus with foot ulcer: Secondary | ICD-10-CM | POA: Diagnosis not present

## 2024-04-13 ENCOUNTER — Encounter: Attending: Physician Assistant | Admitting: Physician Assistant

## 2024-04-13 DIAGNOSIS — E1122 Type 2 diabetes mellitus with diabetic chronic kidney disease: Secondary | ICD-10-CM | POA: Diagnosis not present

## 2024-04-13 DIAGNOSIS — Z89411 Acquired absence of right great toe: Secondary | ICD-10-CM | POA: Insufficient documentation

## 2024-04-13 DIAGNOSIS — N183 Chronic kidney disease, stage 3 unspecified: Secondary | ICD-10-CM | POA: Diagnosis not present

## 2024-04-13 DIAGNOSIS — I129 Hypertensive chronic kidney disease with stage 1 through stage 4 chronic kidney disease, or unspecified chronic kidney disease: Secondary | ICD-10-CM | POA: Diagnosis not present

## 2024-04-13 DIAGNOSIS — E11621 Type 2 diabetes mellitus with foot ulcer: Secondary | ICD-10-CM | POA: Insufficient documentation

## 2024-04-13 DIAGNOSIS — L97512 Non-pressure chronic ulcer of other part of right foot with fat layer exposed: Secondary | ICD-10-CM | POA: Insufficient documentation

## 2024-04-27 ENCOUNTER — Encounter: Admitting: Physician Assistant

## 2024-05-01 ENCOUNTER — Encounter: Admitting: Physician Assistant

## 2024-05-01 DIAGNOSIS — E11621 Type 2 diabetes mellitus with foot ulcer: Secondary | ICD-10-CM | POA: Diagnosis not present

## 2024-05-04 ENCOUNTER — Encounter: Admitting: Physician Assistant

## 2024-05-10 IMAGING — CR DG CHEST 2V
1 series · 3 of 3 positions shown · non-contrast
Comparison: None Available.

CLINICAL DATA: Shortness of breath, diabetes

EXAM:
CHEST - 2 VIEW

[Series 1: dg chest 2 view · 0.14mm/px · 3 of 3 slices shown]
[im 1/3]
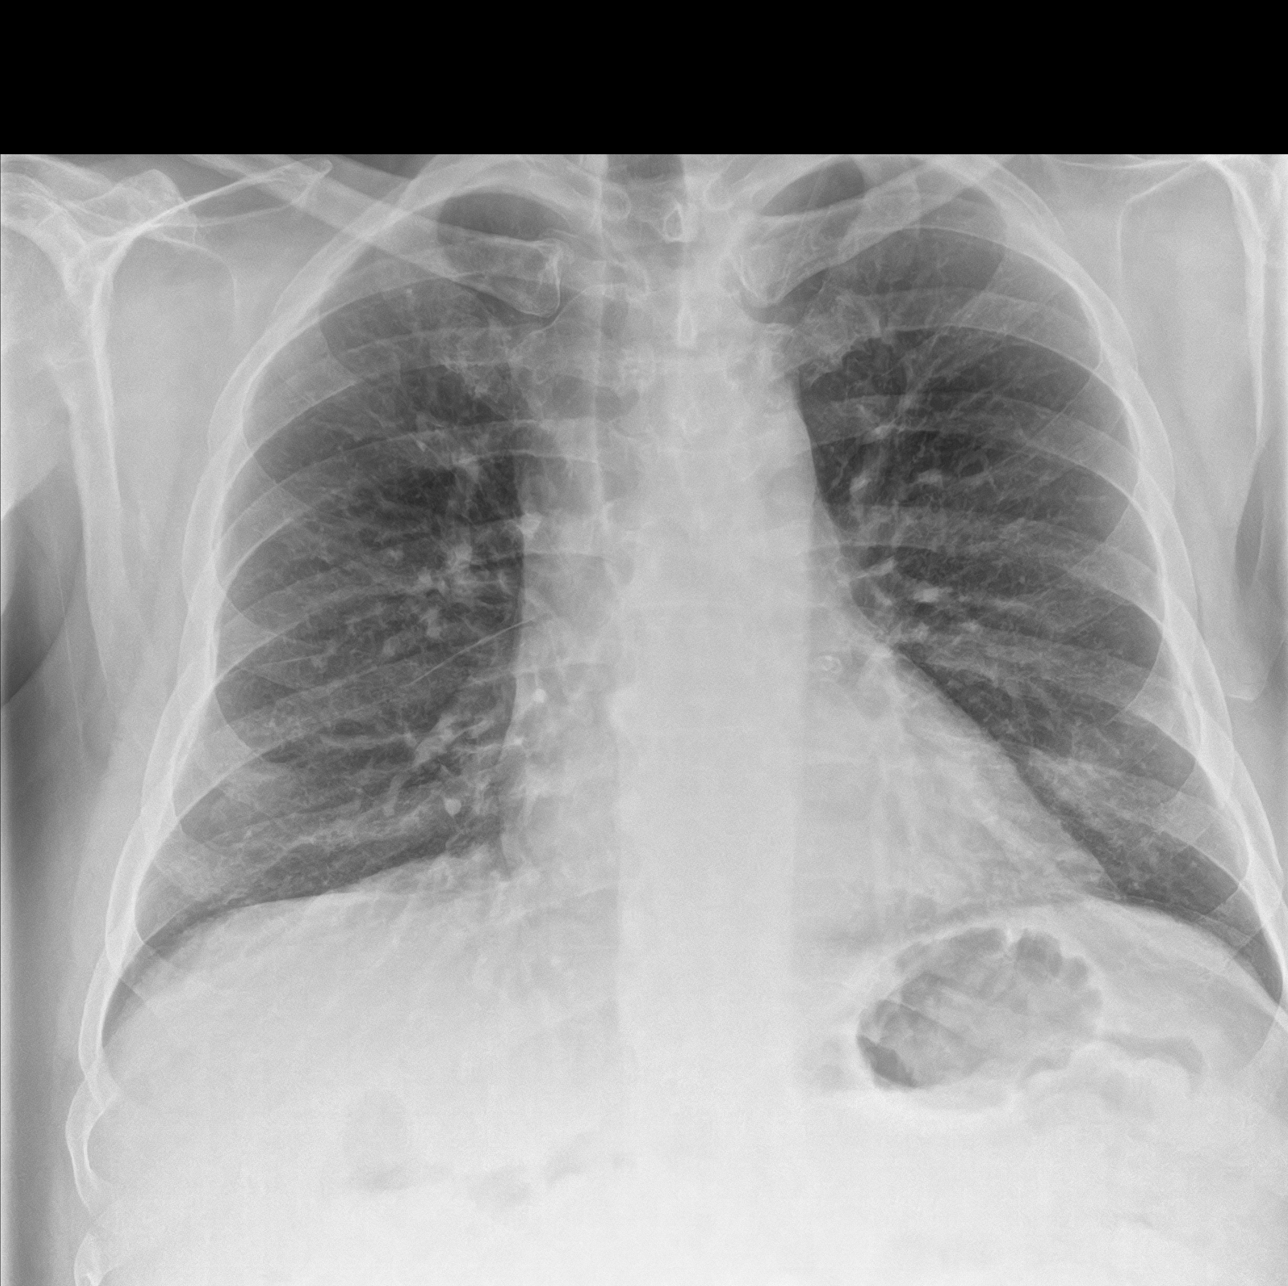
[im 2/3]
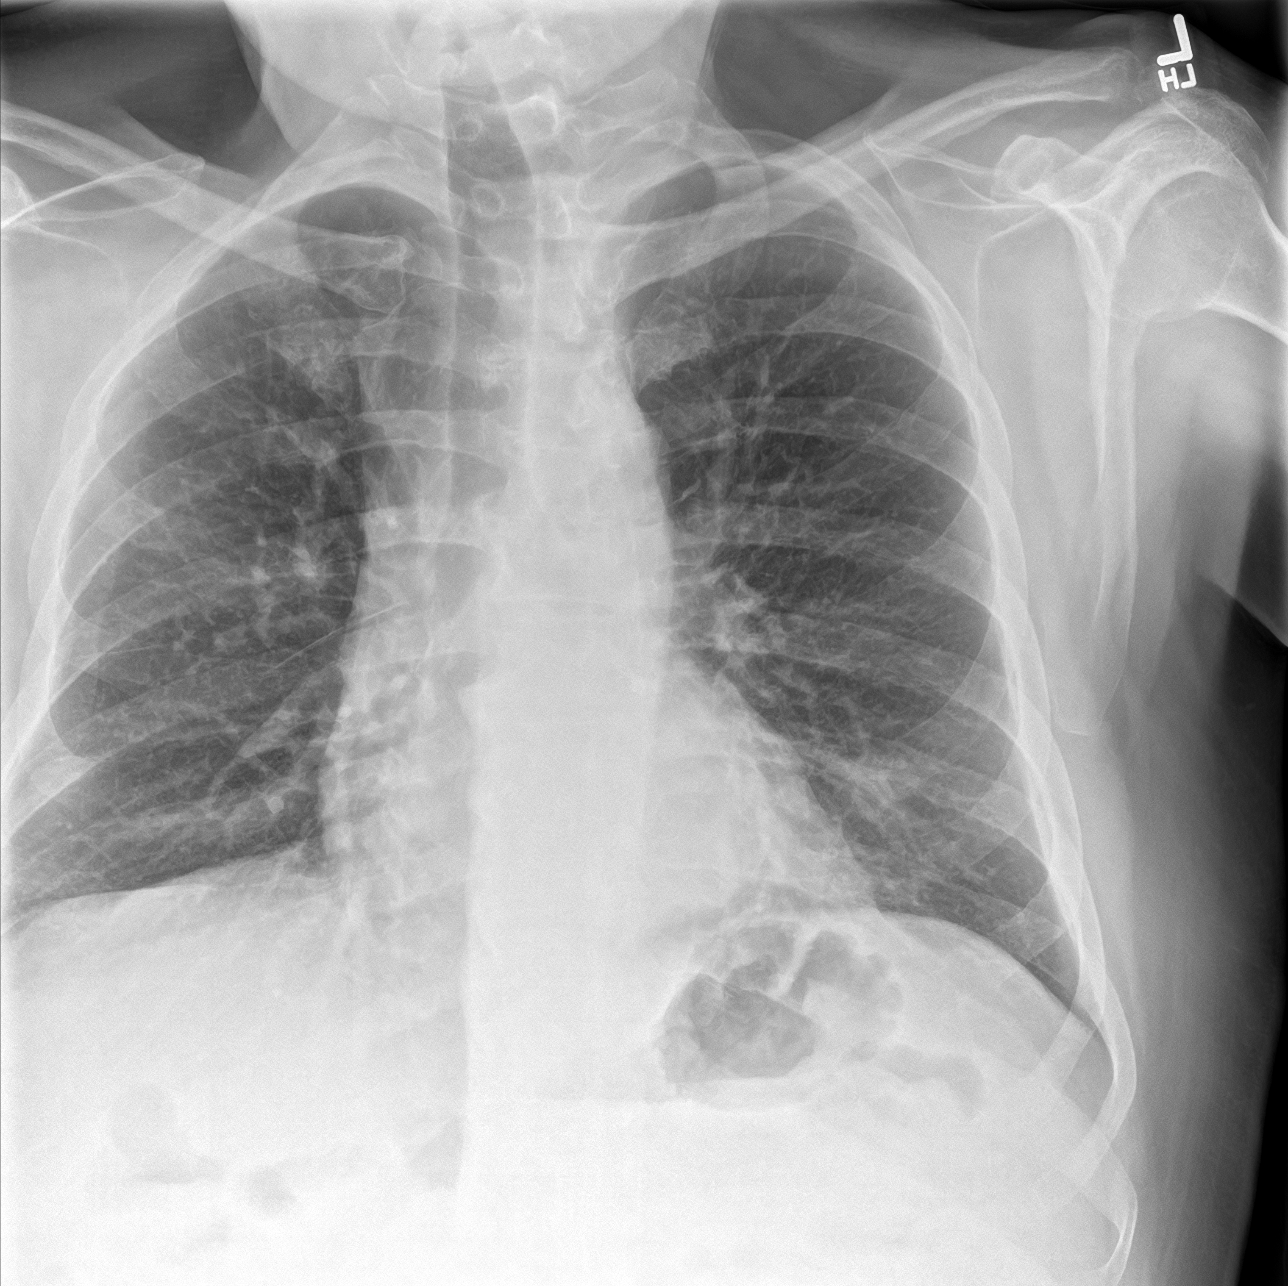
[im 3/3]
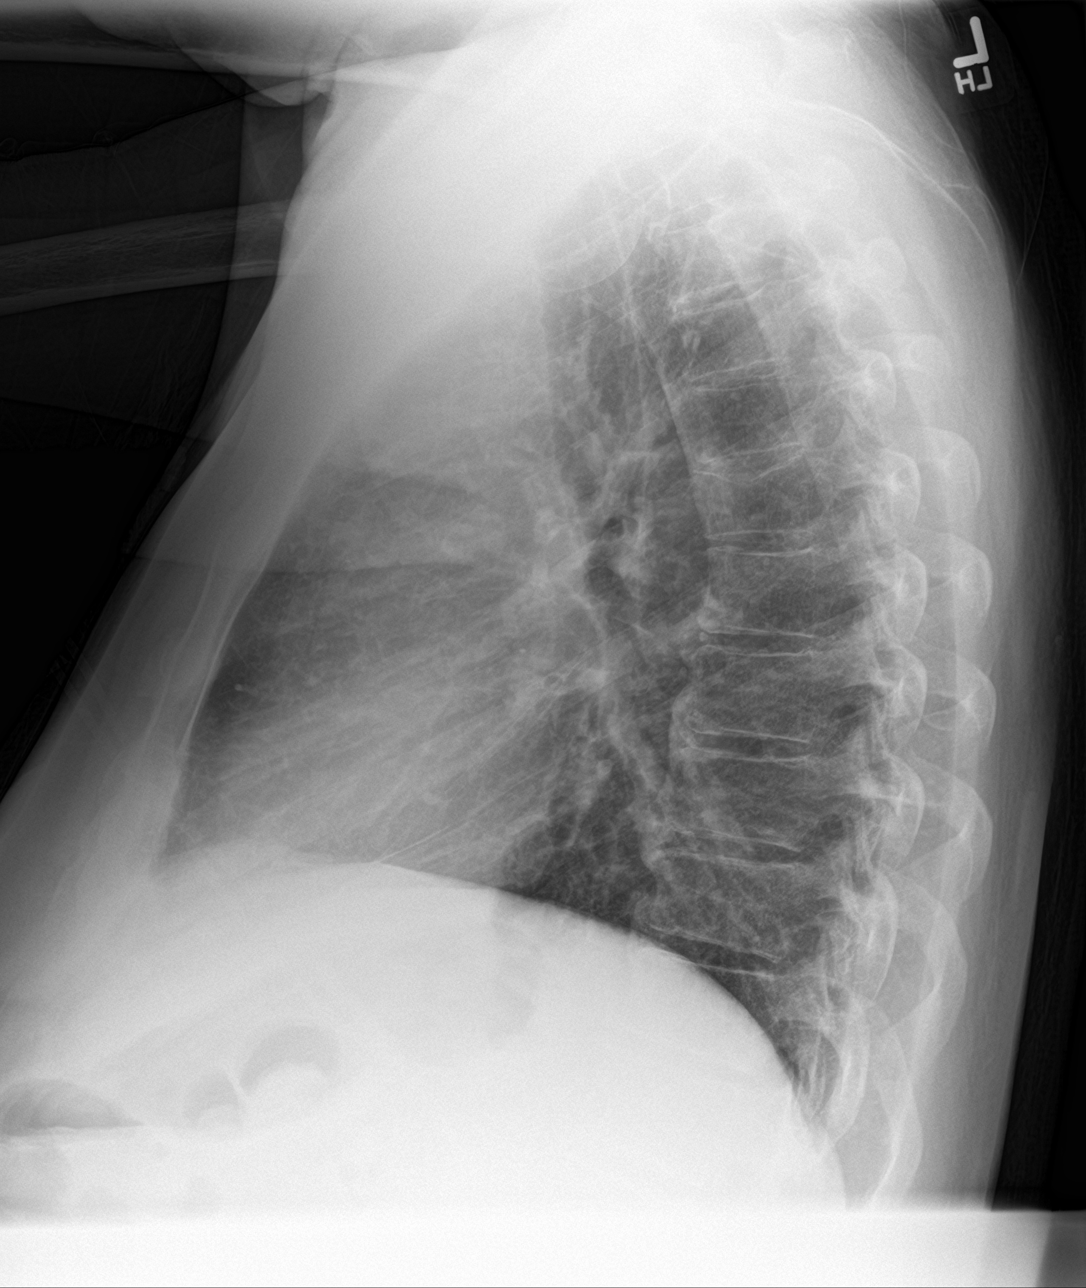

[3 of 3 positions shown; findings below may reference images not displayed]

FINDINGS: The heart size and mediastinal contours are within normal limits.
Both lungs are clear. The visualized skeletal structures are
unremarkable.
IMPRESSION: No active cardiopulmonary disease.

## 2024-05-13 ENCOUNTER — Encounter: Admitting: Physician Assistant

## 2024-06-03 IMAGING — US US RENAL
1 series · 14 of 25 positions shown · non-contrast
Comparison: None Available.

CLINICAL DATA: Chronic renal disease

EXAM:
RENAL / URINARY TRACT ULTRASOUND COMPLETE

[Series 1: us renal · 0.22mm/px · 14 of 40 slices shown]
[im 1/40]
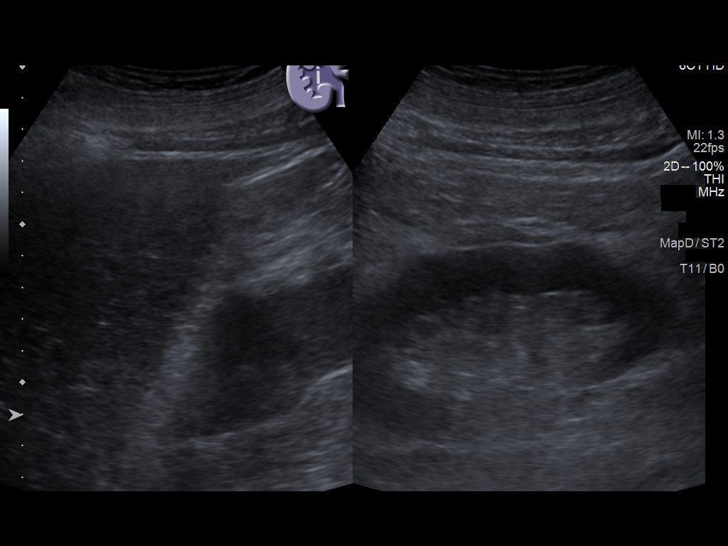
[im 4/40]
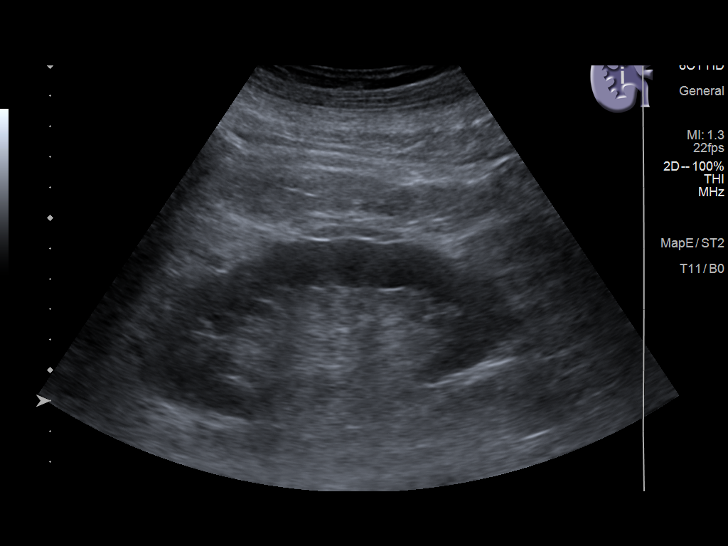
[im 7/40]
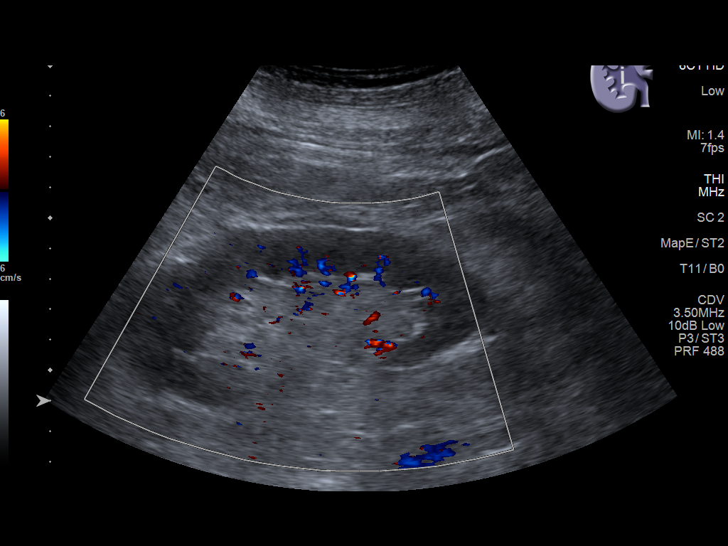
[im 10/40]
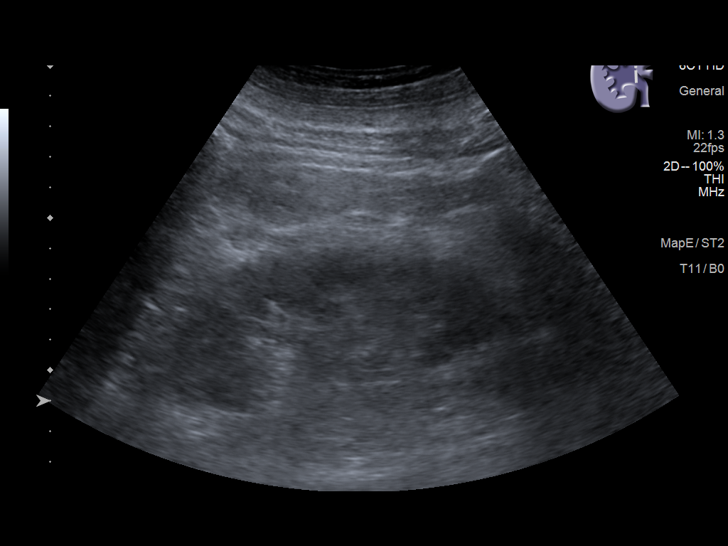
[im 14/40]
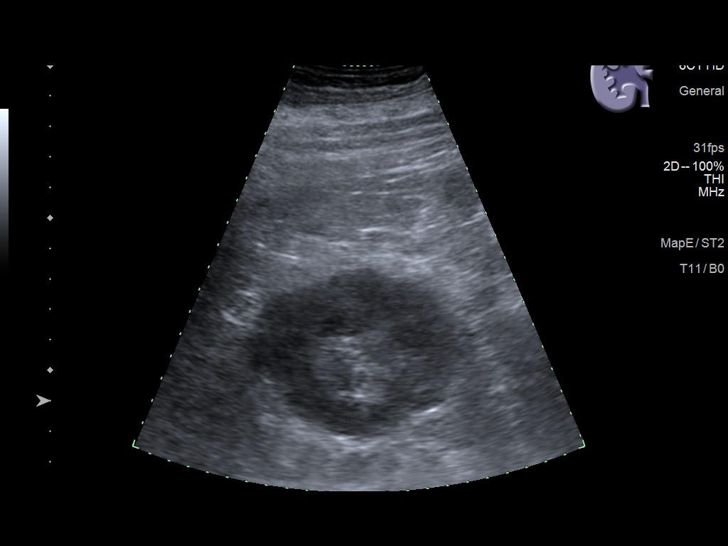
[im 15/40]
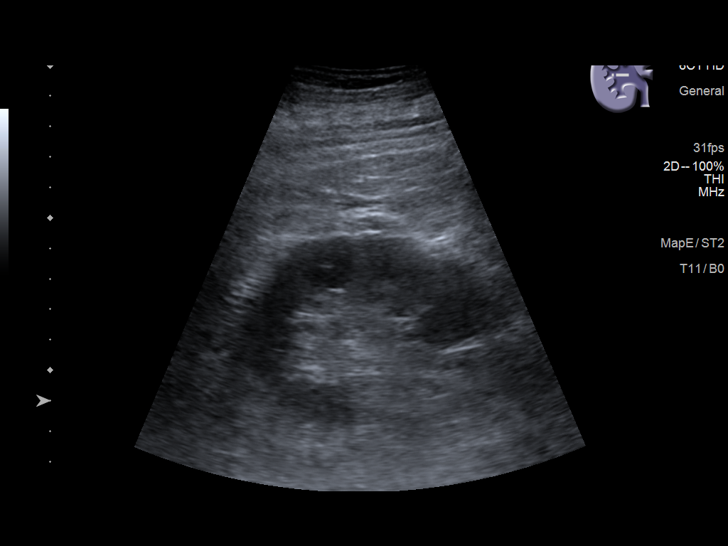
[im 18/40]
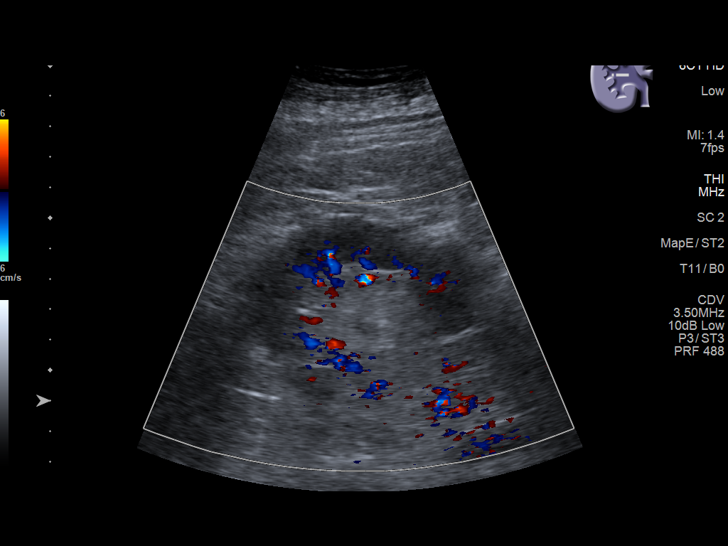
[im 22/40]
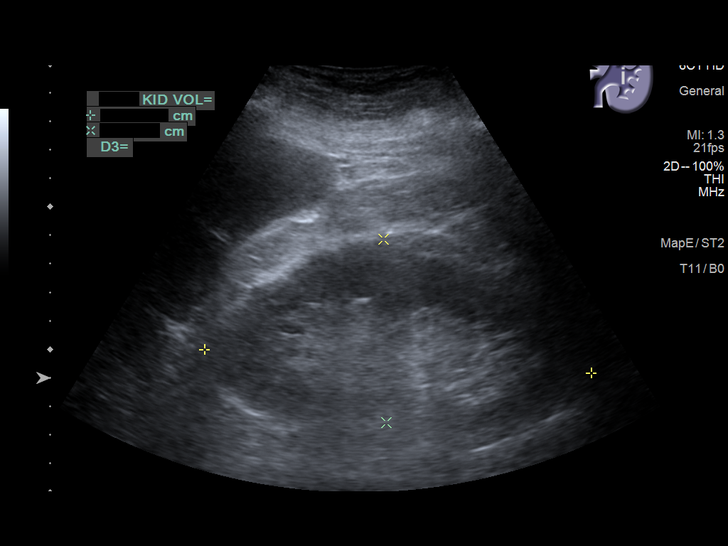
[im 25/40]
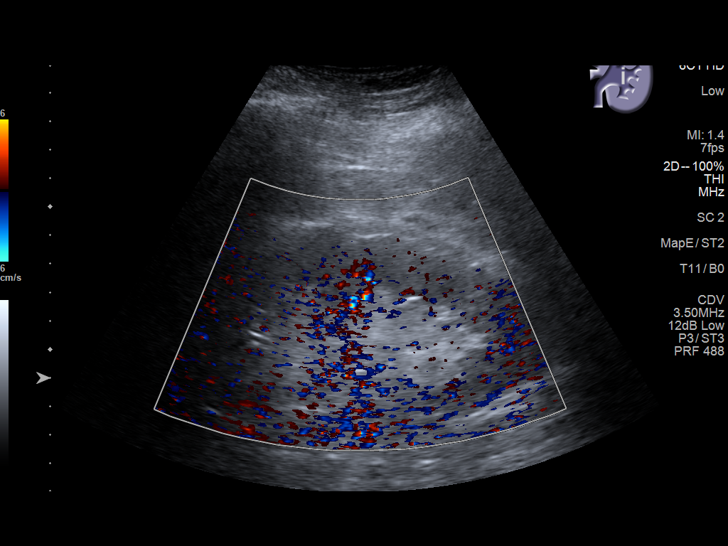
[im 27/40]
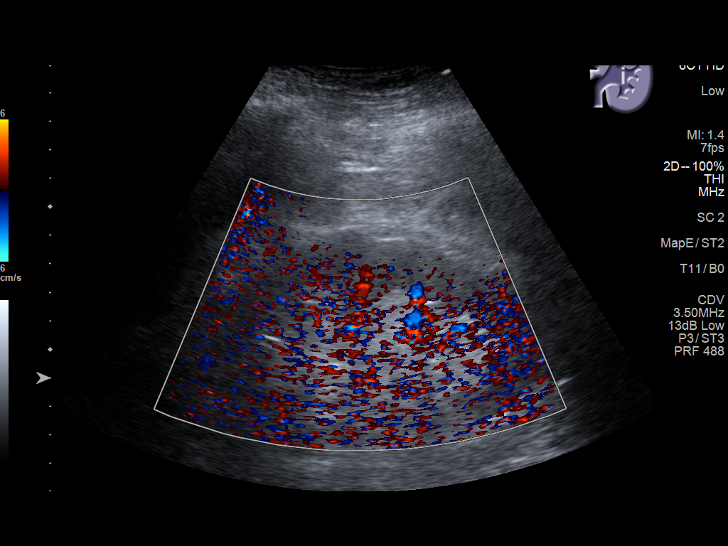
[im 30/40]
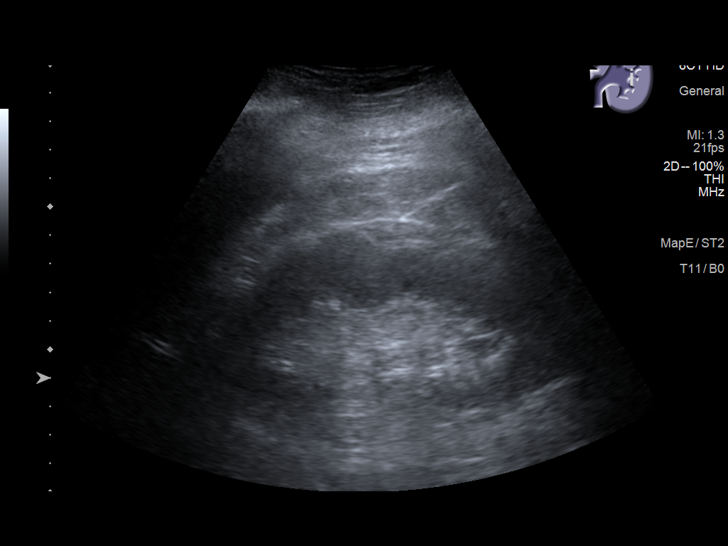
[im 33/40]
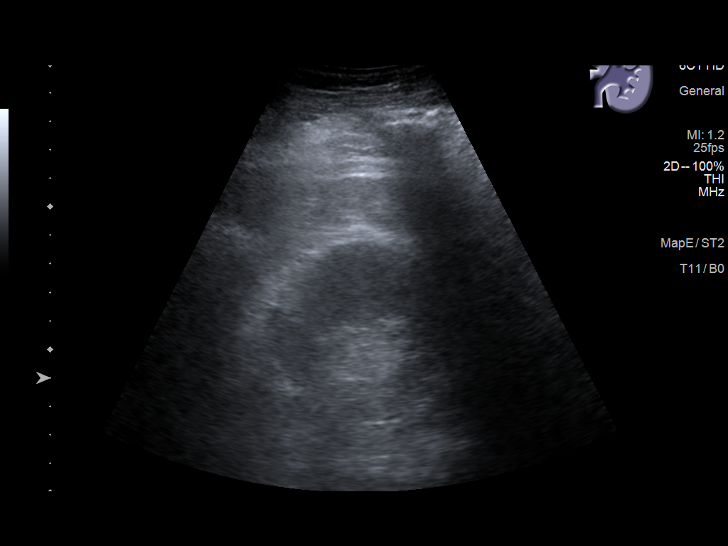
[im 36/40]
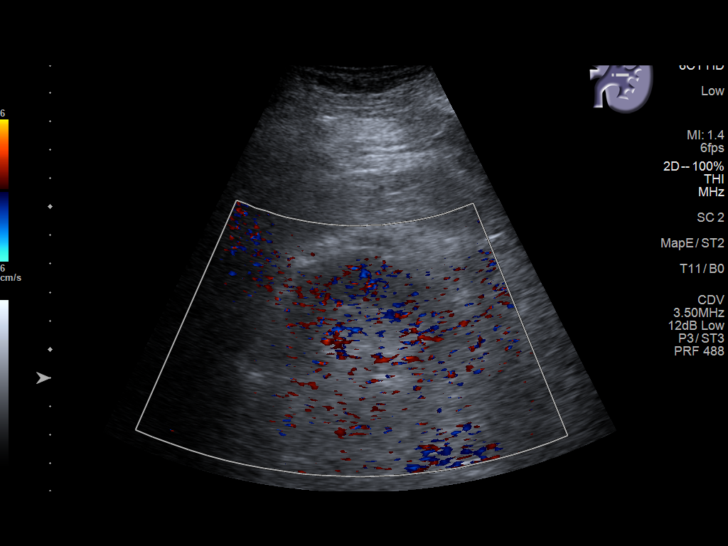
[im 40/40]
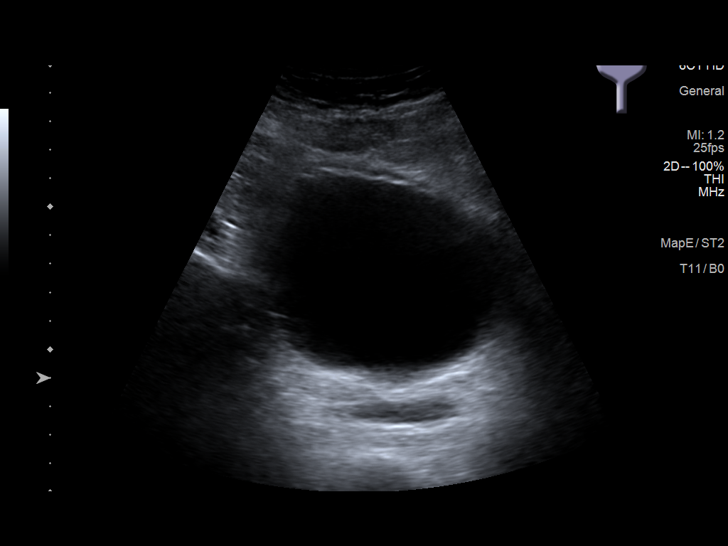

[14 of 25 positions shown; findings below may reference images not displayed]

FINDINGS: Right Kidney:

Renal measurements: 11.4 x 5.1 x 6.1 cm = volume: 186 mL.
Echogenicity within normal limits. No mass or hydronephrosis
visualized.

Left Kidney:

Renal measurements: 13.6 x 6.4 x 6.4 cm = volume: 290 mL.
Echogenicity within normal limits. No mass or hydronephrosis
visualized.

Bladder:

Appears normal for degree of bladder distention.

Other:

None.
IMPRESSION: 1. Normal study.
